# Patient Record
Sex: Female | Born: 1985 | Race: White | Hispanic: No | Marital: Married | Smoking: Never smoker
Health system: Southern US, Community
[De-identification: ages and names within clinical notes are randomized; demographics above are authoritative.]

## PROBLEM LIST (undated history)

## (undated) ENCOUNTER — Inpatient Hospital Stay (HOSPITAL_COMMUNITY): Payer: Self-pay

## (undated) DIAGNOSIS — N390 Urinary tract infection, site not specified: Secondary | ICD-10-CM

## (undated) DIAGNOSIS — R87629 Unspecified abnormal cytological findings in specimens from vagina: Secondary | ICD-10-CM

## (undated) DIAGNOSIS — O139 Gestational [pregnancy-induced] hypertension without significant proteinuria, unspecified trimester: Secondary | ICD-10-CM

## (undated) DIAGNOSIS — D649 Anemia, unspecified: Secondary | ICD-10-CM

## (undated) DIAGNOSIS — L309 Dermatitis, unspecified: Secondary | ICD-10-CM

## (undated) DIAGNOSIS — R0600 Dyspnea, unspecified: Secondary | ICD-10-CM

## (undated) DIAGNOSIS — R0602 Shortness of breath: Secondary | ICD-10-CM

## (undated) HISTORY — DX: Shortness of breath: R06.02

## (undated) HISTORY — PX: LIP REPAIR: SHX440

---

## 2008-11-24 ENCOUNTER — Emergency Department (HOSPITAL_COMMUNITY): Admission: EM | Admit: 2008-11-24 | Discharge: 2008-11-24 | Payer: Self-pay | Admitting: Emergency Medicine

## 2012-03-16 ENCOUNTER — Ambulatory Visit (INDEPENDENT_AMBULATORY_CARE_PROVIDER_SITE_OTHER): Payer: Self-pay | Admitting: Obstetrics and Gynecology

## 2012-03-16 ENCOUNTER — Encounter: Payer: Self-pay | Admitting: Obstetrics and Gynecology

## 2012-03-16 VITALS — BP 110/62 | Resp 16 | Ht 65.0 in | Wt 153.0 lb

## 2012-03-16 DIAGNOSIS — Z202 Contact with and (suspected) exposure to infections with a predominantly sexual mode of transmission: Secondary | ICD-10-CM

## 2012-03-16 DIAGNOSIS — Z139 Encounter for screening, unspecified: Secondary | ICD-10-CM

## 2012-03-16 DIAGNOSIS — N643 Galactorrhea not associated with childbirth: Secondary | ICD-10-CM

## 2012-03-16 DIAGNOSIS — N920 Excessive and frequent menstruation with regular cycle: Secondary | ICD-10-CM

## 2012-03-16 DIAGNOSIS — Z2089 Contact with and (suspected) exposure to other communicable diseases: Secondary | ICD-10-CM

## 2012-03-16 DIAGNOSIS — Z0000 Encounter for general adult medical examination without abnormal findings: Secondary | ICD-10-CM

## 2012-03-16 DIAGNOSIS — Z124 Encounter for screening for malignant neoplasm of cervix: Secondary | ICD-10-CM

## 2012-03-16 NOTE — Progress Notes (Signed)
Contraception per pt none Last pap per pt Never Last Mammo per pt never Last Colonoscopy per pt never Last Dexa Scan Per pt Never Primary MD per pt no PCP. Abuse at Home No.  Pt recently poss exposed to STD and is self pay and doesn't want entire screen but the "most" important.  She also c/o leaking from breast when she pressed it and feels are breasts are heavier, thinks she's pregnant.  Filed Vitals:   03/16/12 1126  BP: 110/62  Resp: 16   ROS: noncontributory  Physical Examination: General appearance - alert, well appearing, and in no distress Neck - supple, no significant adenopathy Chest - clear to auscultation, no wheezes, rales or rhonchi, symmetric air entry Heart - normal rate and regular rhythm Abdomen - soft, nontender, nondistended, no masses or organomegaly Breasts - breasts appear normal, no suspicious masses, no skin or nipple changes or axillary nodes, no nipple discharge Pelvic - normal external genitalia, vulva, vagina, cervix, uterus and adnexa Back exam - no CVAT Extremities - no edema, redness or tenderness in the calves or thighs  A/P TSH, PRL, CBC, serum preg (pt ok to get) STD testing HIV,RPR,GC/CT If galactorrhea persists, RTO sooner and consider mammo/ultrasound

## 2012-03-17 LAB — CBC
HCT: 39.8 % (ref 36.0–46.0)
MCHC: 32.4 g/dL (ref 30.0–36.0)
MCV: 91.5 fL (ref 78.0–100.0)
Platelets: 252 10*3/uL (ref 150–400)
RDW: 14.4 % (ref 11.5–15.5)
WBC: 7.2 10*3/uL (ref 4.0–10.5)

## 2012-03-20 LAB — PAP IG, CT-NG, RFX HPV ASCU: GC Probe Amp: NEGATIVE

## 2012-04-06 ENCOUNTER — Telehealth: Payer: Self-pay | Admitting: Obstetrics and Gynecology

## 2012-04-06 NOTE — Telephone Encounter (Signed)
Ar pt 

## 2012-04-07 ENCOUNTER — Telehealth: Payer: Self-pay

## 2012-04-07 NOTE — Telephone Encounter (Signed)
Jackie/lab res/elect

## 2012-04-07 NOTE — Telephone Encounter (Signed)
LM that all labs look normal, but still need final review by Dr. Su Hilt. If AR has any instructions or recommendations for pt after final review, I will call her back. Melody Comas A

## 2012-10-08 ENCOUNTER — Inpatient Hospital Stay (HOSPITAL_COMMUNITY): Payer: Medicaid Other

## 2012-10-08 ENCOUNTER — Encounter (HOSPITAL_COMMUNITY): Payer: Self-pay | Admitting: *Deleted

## 2012-10-08 ENCOUNTER — Inpatient Hospital Stay (HOSPITAL_COMMUNITY)
Admission: AD | Admit: 2012-10-08 | Discharge: 2012-10-08 | Disposition: A | Discharge status: Home / Self Care | Payer: Medicaid Other | Source: Ambulatory Visit | Attending: Family Medicine | Admitting: Family Medicine

## 2012-10-08 DIAGNOSIS — S39012A Strain of muscle, fascia and tendon of lower back, initial encounter: Secondary | ICD-10-CM

## 2012-10-08 DIAGNOSIS — R1031 Right lower quadrant pain: Secondary | ICD-10-CM | POA: Insufficient documentation

## 2012-10-08 DIAGNOSIS — O99891 Other specified diseases and conditions complicating pregnancy: Secondary | ICD-10-CM | POA: Insufficient documentation

## 2012-10-08 DIAGNOSIS — M549 Dorsalgia, unspecified: Secondary | ICD-10-CM | POA: Insufficient documentation

## 2012-10-08 DIAGNOSIS — O0930 Supervision of pregnancy with insufficient antenatal care, unspecified trimester: Secondary | ICD-10-CM

## 2012-10-08 LAB — URINALYSIS, ROUTINE W REFLEX MICROSCOPIC
Glucose, UA: NEGATIVE mg/dL
Hgb urine dipstick: NEGATIVE
Leukocytes, UA: NEGATIVE
Specific Gravity, Urine: >1.03 — ABNORMAL HIGH (ref 1.005–1.030)
pH: 5.5 (ref 5.0–8.0)

## 2012-10-08 LAB — WET PREP, GENITAL: Trich, Wet Prep: NONE SEEN

## 2012-10-08 NOTE — MAU Note (Signed)
PT SAYS PAIN STARTED Friday ON R FLANK SIDE  - NO MEDS TAKEN.    ON Saturday- AREA SORE.   NOW  PAIN  IS SLIGHT LESS THAN FRI/ SAT.

## 2012-10-08 NOTE — MAU Provider Note (Signed)
History     CSN: [id number]  Arrival date and time: 10/08/12 0035   None     Chief Complaint  Patient presents with  . Flank Pain  . Abdominal Pain   HPI 26 y.o. G2P0010 at [redacted]w[redacted]d by LMP.  pain in right back/side radiating to abdomen. Crampy - like "charlie horse".  Started yesterday, then stopped for a while. Started again this afternoon, worsening. Was lying in bed and turned over and hurt really bad. No medications. No bleeding. Does a lot of lifting at work, works as Adult nurse. No dysuria, no new vaginal discharge, no vaginal discomfort. Feeling baby move.   LMP 05/27/12, certain, regular. [facility]. [facility], waiting for Medicaid. Plans to go to [facility].   Second pregnancy, had miscarriage first trimester.   OB History    Grav Para Term Preterm Abortions TAB SAB Ect Mult Living   2    1  1          Past Medical History  Diagnosis Date  . Shortness of breath     when running    Past Surgical History  Procedure Date  . Lip repair     Family History  Problem Relation Age of Onset  . Diabetes Paternal Grandfather   . Stroke Paternal Grandmother   . Diabetes Paternal Grandmother   . Stroke Maternal Grandmother   . Stroke Maternal Grandfather     History  Substance Use Topics  . Smoking status: Never Smoker, second hand exposure at work  . Smokeless tobacco: Never Used  . Alcohol Use: No    Allergies:  Allergies  Allergen Reactions  . Horseradish (Cochlearia Armoracia) Swelling    Prescriptions prior to admission  Medication Sig Dispense Refill  . Multiple Vitamin (MULTIVITAMIN) capsule Take 1 capsule by mouth daily.        Review of Systems  Constitutional: Negative for fever and chills.  Eyes: Negative for blurred vision and double vision.  Respiratory: Negative for shortness of breath.   Cardiovascular: Negative for chest pain and palpitations.  Gastrointestinal: Positive for  constipation (last BM yesterday). Negative for nausea, vomiting and diarrhea.  Genitourinary: Negative for dysuria, urgency and frequency.  Musculoskeletal: Positive for back pain.  Neurological: Negative for headaches.   Physical Exam   Blood pressure 139/78, pulse 83, temperature 97.6 F (36.4 C), temperature source Oral, resp. rate 20, height 5\' 5"  (1.651 m), weight 79.289 kg (174 lb 12.8 oz), last menstrual period 05/27/2012.  Physical Exam  Constitutional: She is oriented to person, place, and time. She appears well-developed and well-nourished. No distress.  HENT:  Head: Normocephalic and atraumatic.  Eyes: Conjunctivae normal and EOM are normal.  Neck: Normal range of motion. Neck supple.  Cardiovascular: Normal rate, regular rhythm and normal heart sounds.   Respiratory: Effort normal and breath sounds normal. No respiratory distress.  GI: Soft. Bowel sounds are normal. There is tenderness (RLQ, mild pain with palpation of LLQ.). There is no rebound and no guarding.  Genitourinary:       Normal external genitalia, normal vagina and cervix. No CMT. Minimal discharge.  Musculoskeletal: Normal range of motion. She exhibits no edema and no tenderness.  Neurological: She is alert and oriented to person, place, and time.  Skin: Skin is warm and dry.  Psychiatric: She has a normal mood and affect.   FHR:  142 by doppler  Results for orders placed during the hospital encounter of 10/08/12 (from the  past 24 hour(s))  URINALYSIS, ROUTINE W REFLEX MICROSCOPIC     Status: Abnormal   Collection Time   10/08/12 12:47 AM      Component Value Range   Color, Urine YELLOW  YELLOW   APPearance CLEAR  CLEAR   Specific Gravity, Urine >1.030 (*) 1.005 - 1.030   pH 5.5  5.0 - 8.0   Glucose, UA NEGATIVE  NEGATIVE mg/dL   Hgb urine dipstick NEGATIVE  NEGATIVE   Bilirubin Urine NEGATIVE  NEGATIVE   Ketones, ur NEGATIVE  NEGATIVE mg/dL   Protein, ur NEGATIVE  NEGATIVE mg/dL   Urobilinogen, UA  0.2  0.0 - 1.0 mg/dL   Nitrite NEGATIVE  NEGATIVE   Leukocytes, UA NEGATIVE  NEGATIVE  WET PREP, GENITAL     Status: Abnormal   Collection Time   10/08/12  1:25 AM      Component Value Range   Yeast Wet Prep HPF POC NONE SEEN  NONE SEEN   Trich, Wet Prep NONE SEEN  NONE SEEN   Clue Cells Wet Prep HPF POC NONE SEEN  NONE SEEN   WBC, Wet Prep HPF POC FEW (*) NONE SEEN   ULTRASOUND *RADIOLOGY REPORT*  Clinical Data: Right lower quadrant abdominal and back pain.  LIMITED OBSTETRIC ULTRASOUND  Number of Fetuses: 1  Heart Rate: 140 bpm  Movement: Yes  Presentation: Transverse, head on maternal left  Placental Location: Anterior; no evidence of placental abruption  Previa: No  Amniotic Fluid (Subjective): Normal  Vertical pocket: 4.4cm  BPD: 5.19cm 21w 6d EDC: 02/12/2013  MATERNAL FINDINGS:  Cervix: Closed; measures 3.6 cm in length.  Uterus/Adnexae: The uterus is otherwise unremarkable.  The right ovary is grossly unremarkable in appearance, measuring  3.1 x 1.8 x 2.3 cm; the left ovary is not seen. No suspicious  adnexal masses are identified.  IMPRESSION:  1. Single live intrauterine pregnancy noted, with a biparietal  diameter of 5.2 cm, corresponding to a gestational age of [redacted] weeks  6 days. This does not match the gestational age by LMP, and  reflects an estimated date of delivery of Feb 12, 2013.  2. Placenta unremarkable in appearance; no evidence for placental  abruption.  3. Right ovary unremarkable in appearance; left ovary not seen.  Recommend followup with non-emergent complete OB 14+ wk US  examination for fetal biometric evaluation and anatomic survey if  not already performed.  MAU Course  Procedures   Assessment and Plan  26 y.o. G2P0010 at [redacted]w[redacted]d with RLQ and side pain.  - No evidence of kidney stone or pyelonephritis on UA - Likely muscle strain - tylenol, flexeril, heat, rest  Discharge home.  Napoleon Form 10/08/2012, 1:11 AM

## 2012-10-08 NOTE — MAU Note (Signed)
Pain started R flank and comes around to R side of abdomen. Started Friday night and then stopped on Sat. Now back tonight and sore

## 2012-10-08 NOTE — MAU Provider Note (Signed)
Chart reviewed and agree with management and plan.  

## 2012-10-09 LAB — GC/CHLAMYDIA PROBE AMP: GC Probe RNA: NEGATIVE

## 2012-10-11 NOTE — L&D Delivery Note (Signed)
Operative Delivery Note At 5:50 PM a viable female was delivered via Vaginal, Spontaneous Delivery with Vacuum Assitance.  Presentation: vertex; Position: Right,, Occiput,, Anterior; Station: +2 with significant caput and molding.  Verbal consent: obtained from patient.  Risks and benefits discussed in detail.  Risks include, but are not limited to the risks of anesthesia, bleeding, infection, damage to maternal tissues, fetal cephalohematoma.  There is also the risk of inability to effect vaginal delivery of the head, or shoulder dystocia that cannot be resolved by established maneuvers, leading to the need for emergency cesarean section.  Vacuum was in place for two contractions with one pop-off and this helped the presenting head from +2 to +4 station.  The patient was able to deliver the infant with subsequent contractions.  APGAR: 9, 10   Placenta status: Intact, Spontaneous delivery after 30 minutes  Cord: 3 vessels with the following complications: None.  Anesthesia: Epidural  Episiotomy: None Lacerations: 1st degree;Vaginal Suture Repair: 3.0 vicryl rapide Est. Blood Loss (mL): 600 ml  Patient noted to have a Stage I postpartum hemorrhage (PPH); small clot evacuated from lower uterine segment on exam.  She received pitocin IV bolus and 1000 mcg of misoprostol per rectum. Foley catheter placed in bladder.  Code Hemorrhage was activated; labs were drawn.  Bleeding was noted to subside, no further acute intervention needed; Code Hemorrhage called off.  Will follow up labs and continue to observe closely.  Mom to postpartum.  Baby to nursery-stable.  Jaynie Collins, MD, FACOG Attending Obstetrician & Gynecologist Faculty Practice, [facility]

## 2012-10-13 ENCOUNTER — Ambulatory Visit (HOSPITAL_COMMUNITY)
Admission: RE | Admit: 2012-10-13 | Discharge: 2012-10-13 | Disposition: A | Discharge status: Home / Self Care | Payer: Medicaid Other | Source: Ambulatory Visit | Attending: Family Medicine | Admitting: Family Medicine

## 2012-10-13 ENCOUNTER — Encounter: Payer: Self-pay | Admitting: Advanced Practice Midwife

## 2012-10-13 DIAGNOSIS — Z1389 Encounter for screening for other disorder: Secondary | ICD-10-CM | POA: Insufficient documentation

## 2012-10-13 DIAGNOSIS — Z363 Encounter for antenatal screening for malformations: Secondary | ICD-10-CM | POA: Insufficient documentation

## 2012-10-13 DIAGNOSIS — O358XX0 Maternal care for other (suspected) fetal abnormality and damage, not applicable or unspecified: Secondary | ICD-10-CM | POA: Insufficient documentation

## 2012-10-13 DIAGNOSIS — O0930 Supervision of pregnancy with insufficient antenatal care, unspecified trimester: Secondary | ICD-10-CM | POA: Insufficient documentation

## 2012-11-02 ENCOUNTER — Other Ambulatory Visit (HOSPITAL_COMMUNITY)
Admission: RE | Admit: 2012-11-02 | Discharge: 2012-11-02 | Disposition: A | Discharge status: Home / Self Care | Payer: Medicaid Other | Source: Ambulatory Visit | Attending: Advanced Practice Midwife | Admitting: Advanced Practice Midwife

## 2012-11-02 ENCOUNTER — Encounter: Payer: Self-pay | Admitting: Advanced Practice Midwife

## 2012-11-02 ENCOUNTER — Ambulatory Visit (INDEPENDENT_AMBULATORY_CARE_PROVIDER_SITE_OTHER): Payer: Medicaid Other | Admitting: Advanced Practice Midwife

## 2012-11-02 VITALS — BP 111/78 | Temp 97.4°F | Wt 178.2 lb

## 2012-11-02 DIAGNOSIS — Z113 Encounter for screening for infections with a predominantly sexual mode of transmission: Secondary | ICD-10-CM | POA: Insufficient documentation

## 2012-11-02 DIAGNOSIS — O26849 Uterine size-date discrepancy, unspecified trimester: Secondary | ICD-10-CM

## 2012-11-02 DIAGNOSIS — Z3490 Encounter for supervision of normal pregnancy, unspecified, unspecified trimester: Secondary | ICD-10-CM

## 2012-11-02 DIAGNOSIS — O0930 Supervision of pregnancy with insufficient antenatal care, unspecified trimester: Secondary | ICD-10-CM

## 2012-11-02 DIAGNOSIS — O26842 Uterine size-date discrepancy, second trimester: Secondary | ICD-10-CM | POA: Insufficient documentation

## 2012-11-02 DIAGNOSIS — Z01419 Encounter for gynecological examination (general) (routine) without abnormal findings: Secondary | ICD-10-CM | POA: Insufficient documentation

## 2012-11-02 LAB — POCT URINALYSIS DIP (DEVICE)
Glucose, UA: NEGATIVE mg/dL
Ketones, ur: NEGATIVE mg/dL
Protein, ur: NEGATIVE mg/dL
Specific Gravity, Urine: 1.02 (ref 1.005–1.030)

## 2012-11-02 LAB — HIV ANTIBODY (ROUTINE TESTING W REFLEX): HIV: NONREACTIVE

## 2012-11-02 NOTE — Progress Notes (Signed)
Pulse: 84 Pt wakes up with numbness in her arms.

## 2012-11-02 NOTE — Patient Instructions (Signed)
Breastfeeding Deciding to breastfeed is one of the best choices you can make for you and your baby. The information that follows gives a brief overview of the benefits of breastfeeding as well as common topics surrounding breastfeeding. BENEFITS OF BREASTFEEDING For the baby  The first milk (colostrum) helps the baby's digestive system function better.   There are antibodies in the mother's milk that help the baby fight off infections.   The baby has a lower incidence of asthma, allergies, and sudden infant death syndrome (SIDS).   The nutrients in breast milk are better for the baby than infant formulas, and breast milk helps the baby's brain grow better.   Babies who breastfeed have less gas, colic, and constipation.  For the mother  Breastfeeding helps develop a very special bond between the mother and her baby.   Breastfeeding is convenient, always available at the correct temperature, and costs nothing.   Breastfeeding burns calories in the mother and helps her lose weight that was gained during pregnancy.   Breastfeeding makes the uterus contract back down to normal size faster and slows bleeding following delivery.   Breastfeeding mothers have a lower risk of developing breast cancer.  BREASTFEEDING FREQUENCY  A healthy, full-term baby may breastfeed as often as every hour or space his or her feedings to every 3 hours.   Watch your baby for signs of hunger. Nurse your baby if he or she shows signs of hunger. How often you nurse will vary from baby to baby.   Nurse as often as the baby requests, or when you feel the need to reduce the fullness of your breasts.   Awaken the baby if it has been 3 4 hours since the last feeding.   Frequent feeding will help the mother make more milk and will help prevent problems, such as sore nipples and engorgement of the breasts.  BABY'S POSITION AT THE BREAST  Whether lying down or sitting, be sure that the baby's tummy is  facing your tummy.   Support the breast with 4 fingers underneath the breast and the thumb above. Make sure your fingers are well away from the nipple and baby's mouth.   Stroke the baby's lips gently with your finger or nipple.   When the baby's mouth is open wide enough, place all of your nipple and as much of the areola as possible into your baby's mouth.   Pull the baby in close so the tip of the nose and the baby's cheeks touch the breast during the feeding.  FEEDINGS AND SUCTION  The length of each feeding varies from baby to baby and from feeding to feeding.   The baby must suck about 2 3 minutes for your milk to get to him or her. This is called a "let down." For this reason, allow the baby to feed on each breast as long as he or she wants. Your baby will end the feeding when he or she has received the right balance of nutrients.   To break the suction, put your finger into the corner of the baby's mouth and slide it between his or her gums before removing your breast from his or her mouth. This will help prevent sore nipples.  HOW TO TELL WHETHER YOUR BABY IS GETTING ENOUGH BREAST MILK. Wondering whether or not your baby is getting enough milk is a common concern among mothers. You can be assured that your baby is getting enough milk if:   Your baby is actively  sucking and you hear swallowing.   Your baby seems relaxed and satisfied after a feeding.   Your baby nurses at least 8 12 times in a 24 hour time period. Nurse your baby until he or she unlatches or falls asleep at the first breast (at least 10 20 minutes), then offer the second side.   Your baby is wetting 5 6 disposable diapers (6 8 cloth diapers) in a 24 hour period by 81 62 days of age.   Your baby is having at least 3 4 stools every 24 hours for the first 6 weeks. The stool should be soft and yellow.   Your baby should gain 4 7 ounces per week after he or she is 87 days old.   Your breasts feel softer  after nursing.  REDUCING BREAST ENGORGEMENT  In the first week after your baby is born, you may experience signs of breast engorgement. When breasts are engorged, they feel heavy, warm, full, and may be tender to the touch. You can reduce engorgement if you:   Nurse frequently, every 2 3 hours. Mothers who breastfeed early and often have fewer problems with engorgement.   Place light ice packs on your breasts for 10 20 minutes between feedings. This reduces swelling. Wrap the ice packs in a lightweight towel to protect your skin. Bags of frozen vegetables work well for this purpose.   Take a warm shower or apply warm, moist heat to your breast for 5 10 minutes just before each feeding. This increases circulation and helps the milk flow.   Gently massage your breast before and during the feeding. Using your finger tips, massage from the chest wall towards your nipple in a circular motion.   Make sure that the baby empties at least one breast at every feeding before switching sides.   Use a breast pump to empty the breasts if your baby is sleepy or not nursing well. You may also want to pump if you are returning to work oryou feel you are getting engorged.   Avoid bottle feeds, pacifiers, or supplemental feedings of water or juice in place of breastfeeding. Breast milk is all the food your baby needs. It is not necessary for your baby to have water or formula. In fact, to help your breasts make more milk, it is best not to give your baby supplemental feedings during the early weeks.   Be sure the baby is latched on and positioned properly while breastfeeding.   Wear a supportive bra, avoiding underwire styles.   Eat a balanced diet with enough fluids.   Rest often, relax, and take your prenatal vitamins to prevent fatigue, stress, and anemia.  If you follow these suggestions, your engorgement should improve in 24 48 hours. If you are still experiencing difficulty, call your  lactation consultant or caregiver.  CARING FOR YOURSELF Take care of your breasts  Bathe or shower daily.   Avoid using soap on your nipples.   Start feedings on your left breast at one feeding and on your right breast at the next feeding.   You will notice an increase in your milk supply 2 5 days after delivery. You may feel some discomfort from engorgement, which makes your breasts very firm and often tender. Engorgement "peaks" out within 24 48 hours. In the meantime, apply warm moist towels to your breasts for 5 10 minutes before feeding. Gentle massage and expression of some milk before feeding will soften your breasts, making it easier for your  baby to latch on.   Wear a well-fitting nursing bra, and air dry your nipples for a 3 after each feeding.   Only use cotton bra pads.   Only use pure lanolin on your nipples after nursing. You do not need to wash it off before feeding the baby again. Another option is to express a few drops of breast milk and gently massage it into your nipples.  Take care of yourself  Eat well-balanced meals and nutritious snacks.   Drinking milk, fruit juice, and water to satisfy your thirst (about 8 glasses a day).   Get plenty of rest.  Avoid foods that you notice affect the baby in a bad way.  SEEK MEDICAL CARE IF:   You have difficulty with breastfeeding and need help.   You have a hard, red, sore area on your breast that is accompanied by a fever.   Your baby is too sleepy to eat well or is having trouble sleeping.   Your baby is wetting less than 6 diapers a day, by 84 days of age.   Your baby's skin or white part of his or her eyes is more yellow than it was in the hospital.   You feel depressed.  Document Released: 09/27/2005 Document Revised: 03/28/2012 Document Reviewed: 12/26/2011 [facility] Patient Information 2013 [place], [place].   Pregnancy - Second Trimester The second trimester of pregnancy (3 to 6  months) is a period of rapid growth for you and your baby. At the end of the sixth month, your baby is about 9 inches long and weighs 1 1/2 pounds. You will begin to feel the baby move between 18 and 20 weeks of the pregnancy. This is called quickening. Weight gain is faster. A clear fluid (colostrum) may leak out of your breasts. You may feel small contractions of the womb (uterus). This is known as false labor or Braxton-Hicks contractions. This is like a practice for labor when the baby is ready to be born. Usually, the problems with morning sickness have usually passed by the end of your first trimester. Some women develop small dark blotches (called cholasma, mask of pregnancy) on their face that usually goes away after the baby is born. Exposure to the sun makes the blotches worse. Acne may also develop in some pregnant women and pregnant women who have acne, may find that it goes away. PRENATAL EXAMS  Blood work may continue to be done during prenatal exams. These tests are done to check on your health and the probable health of your baby. Blood work is used to follow your blood levels (hemoglobin). Anemia (low hemoglobin) is common during pregnancy. Iron and vitamins are given to help prevent this. You will also be checked for diabetes between 24 and 28 weeks of the pregnancy. Some of the previous blood tests may be repeated.  The size of the uterus is measured during each visit. This is to make sure that the baby is continuing to grow properly according to the dates of the pregnancy.  Your blood pressure is checked every prenatal visit. This is to make sure you are not getting toxemia.  Your urine is checked to make sure you do not have an infection, diabetes or protein in the urine.  Your weight is checked often to make sure gains are happening at the suggested rate. This is to ensure that both you and your baby are growing normally.  Sometimes, an ultrasound is performed to confirm the proper  growth and development  of the baby. This is a test which bounces harmless sound waves off the baby so your caregiver can more accurately determine due dates. Sometimes, a specialized test is done on the amniotic fluid surrounding the baby. This test is called an amniocentesis. The amniotic fluid is obtained by sticking a needle into the belly (abdomen). This is done to check the chromosomes in instances where there is a concern about possible genetic problems with the baby. It is also sometimes done near the end of pregnancy if an early delivery is required. In this case, it is done to help make sure the baby's lungs are mature enough for the baby to live outside of the womb. CHANGES OCCURING IN THE SECOND TRIMESTER OF PREGNANCY Your body goes through many changes during pregnancy. They vary from person to person. Talk to your caregiver about changes you notice that you are concerned about.  During the second trimester, you will likely have an increase in your appetite. It is normal to have cravings for certain foods. This varies from person to person and pregnancy to pregnancy.  Your lower abdomen will begin to bulge.  You may have to urinate more often because the uterus and baby are pressing on your bladder. It is also common to get more bladder infections during pregnancy (pain with urination). You can help this by drinking lots of fluids and emptying your bladder before and after intercourse.  You may begin to get stretch marks on your hips, abdomen, and breasts. These are normal changes in the body during pregnancy. There are no exercises or medications to take that prevent this change.  You may begin to develop swollen and bulging veins (varicose veins) in your legs. Wearing support hose, elevating your feet for 15 minutes, 3 to 4 times a day and limiting salt in your diet helps lessen the problem.  Heartburn may develop as the uterus grows and pushes up against the stomach. Antacids  recommended by your caregiver helps with this problem. Also, eating smaller meals 4 to 5 times a day helps.  Constipation can be treated with a stool softener or adding bulk to your diet. Drinking lots of fluids, vegetables, fruits, and whole grains are helpful.  Exercising is also helpful. If you have been very active up until your pregnancy, most of these activities can be continued during your pregnancy. If you have been less active, it is helpful to start an exercise program such as walking.  Hemorrhoids (varicose veins in the rectum) may develop at the end of the second trimester. Warm sitz baths and hemorrhoid cream recommended by your caregiver helps hemorrhoid problems.  Backaches may develop during this time of your pregnancy. Avoid heavy lifting, wear low heal shoes and practice good posture to help with backache problems.  Some pregnant women develop tingling and numbness of their hand and fingers because of swelling and tightening of ligaments in the wrist (carpel tunnel syndrome). This goes away after the baby is born.  As your breasts enlarge, you may have to get a bigger bra. Get a comfortable, cotton, support bra. Do not get a nursing bra until the last month of the pregnancy if you will be nursing the baby.  You may get a dark line from your belly button to the pubic area called the linea nigra.  You may develop rosy cheeks because of increase blood flow to the face.  You may develop spider looking lines of the face, neck, arms and chest. These go away after  the baby is born. HOME CARE INSTRUCTIONS   It is extremely important to avoid all smoking, herbs, alcohol, and unprescribed drugs during your pregnancy. These chemicals affect the formation and growth of the baby. Avoid these chemicals throughout the pregnancy to ensure the delivery of a healthy infant.  Most of your home care instructions are the same as suggested for the first trimester of your pregnancy. Keep your  caregiver's appointments. Follow your caregiver's instructions regarding medication use, exercise and diet.  During pregnancy, you are providing food for you and your baby. Continue to eat regular, well-balanced meals. Choose foods such as meat, fish, milk and other low fat dairy products, vegetables, fruits, and whole-grain breads and cereals. Your caregiver will tell you of the ideal weight gain.  A physical sexual relationship may be continued up until near the end of pregnancy if there are no other problems. Problems could include early (premature) leaking of amniotic fluid from the membranes, vaginal bleeding, abdominal pain, or other medical or pregnancy problems.  Exercise regularly if there are no restrictions. Check with your caregiver if you are unsure of the safety of some of your exercises. The greatest weight gain will occur in the last 2 trimesters of pregnancy. Exercise will help you:  Control your weight.  Get you in shape for labor and delivery.  Lose weight after you have the baby.  Wear a good support or jogging bra for breast tenderness during pregnancy. This may help if worn during sleep. Pads or tissues may be used in the bra if you are leaking colostrum.  Do not use hot tubs, steam rooms or saunas throughout the pregnancy.  Wear your seat belt at all times when driving. This protects you and your baby if you are in an accident.  Avoid raw meat, uncooked cheese, cat litter boxes and soil used by cats. These carry germs that can cause birth defects in the baby.  The second trimester is also a good time to visit your dentist for your dental health if this has not been done yet. Getting your teeth cleaned is OK. Use a soft toothbrush. Brush gently during pregnancy.  It is easier to loose urine during pregnancy. Tightening up and strengthening the pelvic muscles will help with this problem. Practice stopping your urination while you are going to the bathroom. These are the  same muscles you need to strengthen. It is also the muscles you would use as if you were trying to stop from passing gas. You can practice tightening these muscles up 10 times a set and repeating this about 3 times per day. Once you know what muscles to tighten up, do not perform these exercises during urination. It is more likely to contribute to an infection by backing up the urine.  Ask for help if you have financial, counseling or nutritional needs during pregnancy. Your caregiver will be able to offer counseling for these needs as well as refer you for other special needs.  Your skin may become oily. If so, wash your face with mild soap, use non-greasy moisturizer and oil or cream based makeup. MEDICATIONS AND DRUG USE IN PREGNANCY  Take prenatal vitamins as directed. The vitamin should contain 1 milligram of folic acid. Keep all vitamins out of reach of children. Only a couple vitamins or tablets containing iron may be fatal to a baby or young child when ingested.  Avoid use of all medications, including herbs, over-the-counter medications, not prescribed or suggested by your caregiver. Only take  over-the-counter or prescription medicines for pain, discomfort, or fever as directed by your caregiver. Do not use aspirin.  Let your caregiver also know about herbs you may be using.  Alcohol is related to a number of birth defects. This includes fetal alcohol syndrome. All alcohol, in any form, should be avoided completely. Smoking will cause low birth rate and premature babies.  Street or illegal drugs are very harmful to the baby. They are absolutely forbidden. A baby born to an addicted mother will be addicted at birth. The baby will go through the same withdrawal an adult does. SEEK MEDICAL CARE IF:  You have any concerns or worries during your pregnancy. It is better to call with your questions if you feel they cannot wait, rather than worry about them. SEEK IMMEDIATE MEDICAL CARE IF:   An  unexplained oral temperature above 102 F (38.9 C) develops, or as your caregiver suggests.  You have leaking of fluid from the vagina (birth canal). If leaking membranes are suspected, take your temperature and tell your caregiver of this when you call.  There is vaginal spotting, bleeding, or passing clots. Tell your caregiver of the amount and how many pads are used. Light spotting in pregnancy is common, especially following intercourse.  You develop a bad smelling vaginal discharge with a change in the color from clear to white.  You continue to feel sick to your stomach (nauseated) and have no relief from remedies suggested. You vomit blood or coffee ground-like materials.  You lose more than 2 pounds of weight or gain more than 2 pounds of weight over 1 week, or as suggested by your caregiver.  You notice swelling of your face, hands, feet, or legs.  You get exposed to [place] measles and have never had them.  You are exposed to fifth disease or chickenpox.  You develop belly (abdominal) pain. Round ligament discomfort is a common non-cancerous (benign) cause of abdominal pain in pregnancy. Your caregiver still must evaluate you.  You develop a bad headache that does not go away.  You develop fever, diarrhea, pain with urination, or shortness of breath.  You develop visual problems, blurry, or double vision.  You fall or are in a car accident or any kind of trauma.  There is mental or physical violence at home. Document Released: 09/21/2001 Document Revised: 12/20/2011 Document Reviewed: 03/26/2009 [facility] Patient Information 2013 [place], [place].

## 2012-11-02 NOTE — Progress Notes (Signed)
Nutrition note: 1st visit consult Pt has gained 26.2# @ [redacted]w[redacted]d, which is > expected. Pt reports eating 3 meals & 1 snack/d. Pt reports eating very healthy. Pt is taking PNV. Pt reports no N&V or heartburn.  Pt received verbal & written education on general nutrition during pregnancy. Disc portion sizes. Disc wt gain goals of 15-25# or 0.6#/wk. Pt agrees to continue taking PNV & watch her portion sizes. Pt does not receive WIC but plans to apply. Pt plans to BF. F/u if referred Blondell Reveal, MS, RD, LDN

## 2012-11-02 NOTE — Assessment & Plan Note (Signed)
EDD by LMP 05/27/12 (wrong on [place] report) 03/03/13. EDD by Limited US 10/08/12 and Detailed [place] 10/13/12 02/12/13. F/U US scheduled early Feb to verify EDD.

## 2012-11-02 NOTE — Progress Notes (Signed)
Subjective:    Jenny [name] is being seen today for her first obstetrical visit.  This is not a planned pregnancy. She is at [redacted]w[redacted]d gestation by [place] 10/08/12 and detailed [place] 10/13/12. S>D. Her obstetrical history is significant for excessive weight gain, large for gestational age and late to care. Patient does intend to breast feed. Pregnancy history fully reviewed.  Menstrual History: OB History    Grav Para Term Preterm Abortions TAB SAB Ect Mult Living   2    1  1           Patient's last menstrual period was 05/27/2012. Certain, regular.    The following portions of the patient's history were reviewed and updated as appropriate: allergies, current medications, past family history, past medical history, past social history, past surgical history and problem list.  Review of Systems A comprehensive review of systems was negative.    Objective:    BP 111/78  Temp 97.4 F (36.3 C)  Wt 80.831 kg (178 lb 3.2 oz)  LMP 05/27/2012 General appearance: alert, cooperative, appears stated age and no distress Neck: no adenopathy and thyroid not enlarged, symmetric, no tenderness/mass/nodules Lungs: clear to auscultation bilaterally Heart: regular rate and rhythm, S1, S2 normal, no murmur, click, rub or gallop Abdomen: soft, non-tender; bowel sounds normal; no masses,  no organomegaly. Gravid. Fundal ht 26 cm. Pos FHTs. Pelvic: cervix normal in appearance, external genitalia normal, no adnexal masses or tenderness, no cervical motion tenderness and vagina normal without discharge Extremities: Homans sign is negative, no sign of DVT and no edema, redness or tenderness in the calves or thighs Neurologic: Alert and oriented X 3, normal strength and tone. Normal symmetric reflexes. Normal coordination and gait  Skin: Pink, raised, non-vesicular, excoriated rash on abd and legs.   Assessment:    Pregnancy at 25 and 3/7 weeks  1. Late prenatal care  Prenatal (OB Panel), HIV Antibody ( Reflex),  Culture, OB Urine, Cytology - PAP  2. Uterine size date discrepancy  US OB Follow Up  3. Supervision of normal pregnancy      Plan:    Initial labs drawn. Prenatal vitamins. Problem list reviewed and updated. AFP3 discussed: too late. Role of ultrasound in pregnancy discussed; fetal survey: results reviewed. Amniocentesis discussed: not indicated. Follow up in 3 weeks. 50% of 40 min visit spent on counseling and coordination of care.   [place], [place] 11/02/2012 9:53 PM

## 2012-11-03 ENCOUNTER — Encounter: Payer: Self-pay | Admitting: Advanced Practice Midwife

## 2012-11-03 DIAGNOSIS — Z6791 Unspecified blood type, Rh negative: Secondary | ICD-10-CM | POA: Insufficient documentation

## 2012-11-03 DIAGNOSIS — O26899 Other specified pregnancy related conditions, unspecified trimester: Secondary | ICD-10-CM | POA: Insufficient documentation

## 2012-11-03 LAB — OBSTETRIC PANEL
Antibody Screen: NEGATIVE
Eosinophils Relative: 2 % (ref 0–5)
HCT: 35.2 % — ABNORMAL LOW (ref 36.0–46.0)
Lymphocytes Relative: 14 % (ref 12–46)
Lymphs Abs: 1.8 10*3/uL (ref 0.7–4.0)
MCV: 91 fL (ref 78.0–100.0)
Monocytes Absolute: 0.7 10*3/uL (ref 0.1–1.0)
Monocytes Relative: 6 % (ref 3–12)
RBC: 3.87 MIL/uL (ref 3.87–5.11)
Rubella: 2.07 Index — ABNORMAL HIGH (ref <0.90)
WBC: 12.4 10*3/uL — ABNORMAL HIGH (ref 4.0–10.5)

## 2012-11-05 ENCOUNTER — Other Ambulatory Visit: Payer: Self-pay | Admitting: Advanced Practice Midwife

## 2012-11-05 DIAGNOSIS — O2342 Unspecified infection of urinary tract in pregnancy, second trimester: Secondary | ICD-10-CM

## 2012-11-05 LAB — CULTURE, OB URINE

## 2012-11-05 MED ORDER — NITROFURANTOIN MONOHYD MACRO 100 MG PO CAPS: 100.0000 mg | ORAL_CAPSULE | Freq: Two times a day (BID) | ORAL | Status: DC

## 2012-11-05 NOTE — Progress Notes (Signed)
Urine culture pos UTI. Rx'd Macrobid. Please notify pt.

## 2012-11-06 ENCOUNTER — Encounter: Payer: Self-pay | Admitting: *Deleted

## 2012-11-06 NOTE — Progress Notes (Signed)
Called patient and heard message that her phone does not accept incoming phone calls.  Will mail her a certified letter to inform her of urine culture results.

## 2012-11-07 ENCOUNTER — Other Ambulatory Visit: Payer: Self-pay | Admitting: Advanced Practice Midwife

## 2012-11-07 DIAGNOSIS — B951 Streptococcus, group B, as the cause of diseases classified elsewhere: Secondary | ICD-10-CM

## 2012-11-07 MED ORDER — AMPICILLIN 500 MG PO CAPS: 500.0000 mg | ORAL_CAPSULE | Freq: Four times a day (QID) | ORAL | Status: DC

## 2012-11-07 NOTE — Progress Notes (Signed)
Dx UTI. Macrobid already ordered 11/05/12. Final results show Enterococcus AND GBS. Needs Amp 500 PO Q6 x 7 days. May D/C Macrobid since Amp covers both. Will E-Rx. Please try to notify pt.

## 2012-11-08 ENCOUNTER — Telehealth: Payer: Self-pay | Admitting: *Deleted

## 2012-11-08 NOTE — Telephone Encounter (Signed)
Message copied by Gerome Apley on Wed Nov 08, 2012 11:24 AM ------      Message from: [place], [place]      Created: Tue Nov 07, 2012  8:39 PM       Dx UTI. Macrobid already ordered 11/05/12. Final results show Enterococcus AND GBS. Needs Amp 500 PO Q6 x 7 days. May D/C Macrobid since Amp covers both. Will E-Rx. Please try to notify pt.

## 2012-11-08 NOTE — Telephone Encounter (Signed)
Called Jenny [name] and left a message on her home phone number that we are trying to reach her with some important information- please call clinic during office hours and leave message best time and number to reach you

## 2012-11-08 NOTE — Telephone Encounter (Signed)
Jenny [name] called and left a message she was returning our call. Called Jenny [name] and got her voice mail and left a message we are returning her call- please call clinic tomorrow 8am-4pm.

## 2012-11-09 NOTE — Telephone Encounter (Signed)
Called patient, no answer- left message to call us back at the clinics for some information

## 2012-11-10 ENCOUNTER — Other Ambulatory Visit: Payer: Self-pay | Admitting: Obstetrics and Gynecology

## 2012-11-10 DIAGNOSIS — B951 Streptococcus, group B, as the cause of diseases classified elsewhere: Secondary | ICD-10-CM

## 2012-11-10 MED ORDER — AMPICILLIN 500 MG PO CAPS: 500.0000 mg | ORAL_CAPSULE | Freq: Four times a day (QID) | ORAL | Status: DC

## 2012-11-10 NOTE — Telephone Encounter (Signed)
Called pt and left detailed message on her personal voice mail regarding test results and need to change medication. She should begin her new medication today as directed and stop taking the Macrobid. She may call back if she has questions.

## 2012-11-15 ENCOUNTER — Ambulatory Visit (INDEPENDENT_AMBULATORY_CARE_PROVIDER_SITE_OTHER): Payer: Medicaid Other | Admitting: Obstetrics and Gynecology

## 2012-11-15 VITALS — BP 124/79 | Temp 97.4°F | Wt 186.0 lb

## 2012-11-15 DIAGNOSIS — O0930 Supervision of pregnancy with insufficient antenatal care, unspecified trimester: Secondary | ICD-10-CM

## 2012-11-15 LAB — POCT URINALYSIS DIP (DEVICE)
Ketones, ur: NEGATIVE mg/dL
Protein, ur: NEGATIVE mg/dL
Specific Gravity, Urine: 1.02 (ref 1.005–1.030)
pH: 6 (ref 5.0–8.0)

## 2012-11-15 NOTE — Progress Notes (Signed)
Pulse 83 Edema trace in hands.

## 2012-11-15 NOTE — Patient Instructions (Signed)

## 2012-11-15 NOTE — Progress Notes (Signed)
Wants to find out blood type of FOB before getting Rhophylac. 1 hr glucola, RPR, CBC today.  Right CTS symptoms discussed> try splint.   On Amox for enterococcus/GBS ASB.

## 2012-11-16 ENCOUNTER — Ambulatory Visit (HOSPITAL_COMMUNITY)
Admission: RE | Admit: 2012-11-16 | Discharge: 2012-11-16 | Disposition: A | Discharge status: Home / Self Care | Payer: Medicaid Other | Source: Ambulatory Visit | Attending: Advanced Practice Midwife | Admitting: Advanced Practice Midwife

## 2012-11-16 ENCOUNTER — Ambulatory Visit: Payer: Medicaid Other

## 2012-11-16 DIAGNOSIS — O26842 Uterine size-date discrepancy, second trimester: Secondary | ICD-10-CM

## 2012-11-16 DIAGNOSIS — B951 Streptococcus, group B, as the cause of diseases classified elsewhere: Secondary | ICD-10-CM

## 2012-11-16 DIAGNOSIS — O26899 Other specified pregnancy related conditions, unspecified trimester: Secondary | ICD-10-CM

## 2012-11-16 DIAGNOSIS — O26849 Uterine size-date discrepancy, unspecified trimester: Secondary | ICD-10-CM

## 2012-11-16 DIAGNOSIS — Z3689 Encounter for other specified antenatal screening: Secondary | ICD-10-CM | POA: Insufficient documentation

## 2012-11-16 DIAGNOSIS — Z6791 Unspecified blood type, Rh negative: Secondary | ICD-10-CM

## 2012-11-16 DIAGNOSIS — Z3490 Encounter for supervision of normal pregnancy, unspecified, unspecified trimester: Secondary | ICD-10-CM

## 2012-11-16 LAB — CBC
HCT: 32.8 % — ABNORMAL LOW (ref 36.0–46.0)
Hemoglobin: 11 g/dL — ABNORMAL LOW (ref 12.0–15.0)
MCV: 91.4 fL (ref 78.0–100.0)
RDW: 14.2 % (ref 11.5–15.5)
WBC: 11.3 10*3/uL — ABNORMAL HIGH (ref 4.0–10.5)

## 2012-11-16 MED ORDER — RHO D IMMUNE GLOBULIN 1500 UNIT/2ML IJ SOLN
300.0000 ug | Freq: Once | INTRAMUSCULAR | Status: AC
  Administered 2012-11-16: 300 ug via INTRAMUSCULAR

## 2012-11-17 LAB — ANTIBODY SCREEN: Antibody Screen: NEGATIVE

## 2012-11-20 ENCOUNTER — Encounter: Payer: Self-pay | Admitting: Advanced Practice Midwife

## 2012-11-29 ENCOUNTER — Other Ambulatory Visit: Payer: Self-pay | Admitting: Advanced Practice Midwife

## 2012-11-29 ENCOUNTER — Ambulatory Visit (INDEPENDENT_AMBULATORY_CARE_PROVIDER_SITE_OTHER): Payer: Medicaid Other | Admitting: Obstetrics and Gynecology

## 2012-11-29 VITALS — BP 120/73 | Temp 97.6°F | Wt 187.8 lb

## 2012-11-29 DIAGNOSIS — Z6791 Unspecified blood type, Rh negative: Secondary | ICD-10-CM

## 2012-11-29 DIAGNOSIS — O360990 Maternal care for other rhesus isoimmunization, unspecified trimester, not applicable or unspecified: Secondary | ICD-10-CM

## 2012-11-29 LAB — POCT URINALYSIS DIP (DEVICE)
Bilirubin Urine: NEGATIVE
Ketones, ur: NEGATIVE mg/dL
Protein, ur: NEGATIVE mg/dL
Specific Gravity, Urine: 1.015 (ref 1.005–1.030)

## 2012-11-29 NOTE — Progress Notes (Signed)
Doing well. Labs nl. Tx GBS in labor.

## 2012-11-29 NOTE — Patient Instructions (Signed)
Pregnancy - Third Trimester  The third trimester of pregnancy (the last 3 months) is a period of the most rapid growth for you and your baby. The baby approaches a length of 20 inches and a weight of 6 to 10 pounds. The baby is adding on fat and getting ready for life outside your body. While inside, babies have periods of sleeping and waking, suck their thumbs, and hiccups. You can often feel small contractions of the uterus. This is false labor. It is also called Braxton-Hicks contractions. This is like a practice for labor. The usual problems in this stage of pregnancy include more difficulty breathing, swelling of the hands and feet from water retention, and having to urinate more often because of the uterus and baby pressing on your bladder.   PRENATAL EXAMS  · Blood work may continue to be done during prenatal exams. These tests are done to check on your health and the probable health of your baby. Blood work is used to follow your blood levels (hemoglobin). Anemia (low hemoglobin) is common during pregnancy. Iron and vitamins are given to help prevent this. You may also continue to be checked for diabetes. Some of the past blood tests may be done again.  · The size of the uterus is measured during each visit. This makes sure your baby is growing properly according to your pregnancy dates.  · Your blood pressure is checked every prenatal visit. This is to make sure you are not getting toxemia.  · Your urine is checked every prenatal visit for infection, diabetes and protein.  · Your weight is checked at each visit. This is done to make sure gains are happening at the suggested rate and that you and your baby are growing normally.  · Sometimes, an ultrasound is performed to confirm the position and the proper growth and development of the baby. This is a test done that bounces harmless sound waves off the baby so your caregiver can more accurately determine due dates.  · Discuss the type of pain medication and  anesthesia you will have during your labor and delivery.  · Discuss the possibility and anesthesia if a Cesarean Section might be necessary.  · Inform your caregiver if there is any mental or physical violence at home.  Sometimes, a specialized non-stress test, contraction stress test and biophysical profile are done to make sure the baby is not having a problem. Checking the amniotic fluid surrounding the baby is called an amniocentesis. The amniotic fluid is removed by sticking a needle into the belly (abdomen). This is sometimes done near the end of pregnancy if an early delivery is required. In this case, it is done to help make sure the baby's lungs are mature enough for the baby to live outside of the womb. If the lungs are not mature and it is unsafe to deliver the baby, an injection of cortisone medication is given to the mother 1 to 2 days before the delivery. This helps the baby's lungs mature and makes it safer to deliver the baby.  CHANGES OCCURING IN THE THIRD TRIMESTER OF PREGNANCY  Your body goes through many changes during pregnancy. They vary from person to person. Talk to your caregiver about changes you notice and are concerned about.  · During the last trimester, you have probably had an increase in your appetite. It is normal to have cravings for certain foods. This varies from person to person and pregnancy to pregnancy.  · You may begin to   get stretch marks on your hips, abdomen, and breasts. These are normal changes in the body during pregnancy. There are no exercises or medications to take which prevent this change.  · Constipation may be treated with a stool softener or adding bulk to your diet. Drinking lots of fluids, fiber in vegetables, fruits, and whole grains are helpful.  · Exercising is also helpful. If you have been very active up until your pregnancy, most of these activities can be continued during your pregnancy. If you have been less active, it is helpful to start an exercise  program such as walking. Consult your caregiver before starting exercise programs.  · Avoid all smoking, alcohol, un-prescribed drugs, herbs and "street drugs" during your pregnancy. These chemicals affect the formation and growth of the baby. Avoid chemicals throughout the pregnancy to ensure the delivery of a healthy infant.  · Backache, varicose veins and hemorrhoids may develop or get worse.  · You will tire more easily in the third trimester, which is normal.  · The baby's movements may be stronger and more often.  · You may become short of breath easily.  · Your belly button may stick out.  · A yellow discharge may leak from your breasts called colostrum.  · You may have a bloody mucus discharge. This usually occurs a few days to a week before labor begins.  HOME CARE INSTRUCTIONS   · Keep your caregiver's appointments. Follow your caregiver's instructions regarding medication use, exercise, and diet.  · During pregnancy, you are providing food for you and your baby. Continue to eat regular, well-balanced meals. Choose foods such as meat, fish, milk and other low fat dairy products, vegetables, fruits, and whole-grain breads and cereals. Your caregiver will tell you of the ideal weight gain.  · A physical sexual relationship may be continued throughout pregnancy if there are no other problems such as early (premature) leaking of amniotic fluid from the membranes, vaginal bleeding, or belly (abdominal) pain.  · Exercise regularly if there are no restrictions. Check with your caregiver if you are unsure of the safety of your exercises. Greater weight gain will occur in the last 2 trimesters of pregnancy. Exercising helps:  · Control your weight.  · Get you in shape for labor and delivery.  · You lose weight after you deliver.  · Rest a lot with legs elevated, or as needed for leg cramps or low back pain.  · Wear a good support or jogging bra for breast tenderness during pregnancy. This may help if worn during  sleep. Pads or tissues may be used in the bra if you are leaking colostrum.  · Do not use hot tubs, steam rooms, or saunas.  · Wear your seat belt when driving. This protects you and your baby if you are in an accident.  · Avoid raw meat, cat litter boxes and soil used by cats. These carry germs that can cause birth defects in the baby.  · It is easier to loose urine during pregnancy. Tightening up and strengthening the pelvic muscles will help with this problem. You can practice stopping your urination while you are going to the bathroom. These are the same muscles you need to strengthen. It is also the muscles you would use if you were trying to stop from passing gas. You can practice tightening these muscles up 10 times a set and repeating this about 3 times per day. Once you know what muscles to tighten up, do not perform these   exercises during urination. It is more likely to cause an infection by backing up the urine.  · Ask for help if you have financial, counseling or nutritional needs during pregnancy. Your caregiver will be able to offer counseling for these needs as well as refer you for other special needs.  · Make a list of emergency phone numbers and have them available.  · Plan on getting help from family or friends when you go home from the hospital.  · Make a trial run to the hospital.  · Take prenatal classes with the father to understand, practice and ask questions about the labor and delivery.  · Prepare the baby's room/nursery.  · Do not travel out of the city unless it is absolutely necessary and with the advice of your caregiver.  · Wear only low or no heal shoes to have better balance and prevent falling.  MEDICATIONS AND DRUG USE IN PREGNANCY  · Take prenatal vitamins as directed. The vitamin should contain 1 milligram of folic acid. Keep all vitamins out of reach of children. Only a couple vitamins or tablets containing iron may be fatal to a baby or young child when ingested.  · Avoid use  of all medications, including herbs, over-the-counter medications, not prescribed or suggested by your caregiver. Only take over-the-counter or prescription medicines for pain, discomfort, or fever as directed by your caregiver. Do not use aspirin, ibuprofen (Motrin®, Advil®, Nuprin®) or naproxen (Aleve®) unless OK'd by your caregiver.  · Let your caregiver also know about herbs you may be using.  · Alcohol is related to a number of birth defects. This includes fetal alcohol syndrome. All alcohol, in any form, should be avoided completely. Smoking will cause low birth rate and premature babies.  · Street/illegal drugs are very harmful to the baby. They are absolutely forbidden. A baby born to an addicted mother will be addicted at birth. The baby will go through the same withdrawal an adult does.  SEEK MEDICAL CARE IF:  You have any concerns or worries during your pregnancy. It is better to call with your questions if you feel they cannot wait, rather than worry about them.  DECISIONS ABOUT CIRCUMCISION  You may or may not know the sex of your baby. If you know your baby is a boy, it may be time to think about circumcision. Circumcision is the removal of the foreskin of the penis. This is the skin that covers the sensitive end of the penis. There is no proven medical need for this. Often this decision is made on what is popular at the time or based upon religious beliefs and social issues. You can discuss these issues with your caregiver or pediatrician.  SEEK IMMEDIATE MEDICAL CARE IF:   · An unexplained oral temperature above 102° F (38.9° C) develops, or as your caregiver suggests.  · You have leaking of fluid from the vagina (birth canal). If leaking membranes are suspected, take your temperature and tell your caregiver of this when you call.  · There is vaginal spotting, bleeding or passing clots. Tell your caregiver of the amount and how many pads are used.  · You develop a bad smelling vaginal discharge with  a change in the color from clear to white.  · You develop vomiting that lasts more than 24 hours.  · You develop chills or fever.  · You develop shortness of breath.  · You develop burning on urination.  · You loose more than 2 pounds of weight   or gain more than 2 pounds of weight or as suggested by your caregiver.  · You notice sudden swelling of your face, hands, and feet or legs.  · You develop belly (abdominal) pain. Round ligament discomfort is a common non-cancerous (benign) cause of abdominal pain in pregnancy. Your caregiver still must evaluate you.  · You develop a severe headache that does not go away.  · You develop visual problems, blurred or double vision.  · If you have not felt your baby move for more than 1 hour. If you think the baby is not moving as much as usual, eat something with sugar in it and lie down on your left side for an hour. The baby should move at least 4 to 5 times per hour. Call right away if your baby moves less than that.  · You fall, are in a car accident or any kind of trauma.  · There is mental or physical violence at home.  Document Released: 09/21/2001 Document Revised: 12/20/2011 Document Reviewed: 03/26/2009  [employer]® Patient Information ©2013 [employer].

## 2012-12-13 ENCOUNTER — Ambulatory Visit (INDEPENDENT_AMBULATORY_CARE_PROVIDER_SITE_OTHER): Payer: Medicaid Other | Admitting: Advanced Practice Midwife

## 2012-12-13 ENCOUNTER — Other Ambulatory Visit: Payer: Self-pay | Admitting: Advanced Practice Midwife

## 2012-12-13 VITALS — BP 121/79 | Temp 97.7°F | Wt 191.9 lb

## 2012-12-13 DIAGNOSIS — O2390 Unspecified genitourinary tract infection in pregnancy, unspecified trimester: Secondary | ICD-10-CM

## 2012-12-13 DIAGNOSIS — O2342 Unspecified infection of urinary tract in pregnancy, second trimester: Secondary | ICD-10-CM

## 2012-12-13 LAB — POCT URINALYSIS DIP (DEVICE)
Bilirubin Urine: NEGATIVE
Glucose, UA: NEGATIVE mg/dL
Hgb urine dipstick: NEGATIVE
Ketones, ur: NEGATIVE mg/dL
Specific Gravity, Urine: 1.015 (ref 1.005–1.030)
Urobilinogen, UA: 0.2 mg/dL (ref 0.0–1.0)

## 2012-12-13 NOTE — Progress Notes (Signed)
Pt denies contractions, vaginal bleeding, or loss of fluid. Reports normal fetal movement. Complains of right hand swelling, numbness, and tingling. Recommended cock-up splint. Rh neg - got Rhophylac @28  wks, will get again post-partum.  GBS UTI treated with amoxicillin - test of cure today and will need PCN in labor. Does not want flu shot.

## 2012-12-13 NOTE — Progress Notes (Signed)
Pulse- 91 Patient reports some pelvic pain; reports a lot of numbness in right hand does seem slightly worse than usual, can hardly hold on to things at time.

## 2012-12-13 NOTE — Progress Notes (Signed)
Seen also by me Agree with resident's note  

## 2012-12-15 LAB — CULTURE, OB URINE: Colony Count: 70000

## 2012-12-27 ENCOUNTER — Ambulatory Visit (INDEPENDENT_AMBULATORY_CARE_PROVIDER_SITE_OTHER): Payer: Medicaid Other | Admitting: Advanced Practice Midwife

## 2012-12-27 VITALS — BP 122/82 | Temp 96.3°F | Wt 194.5 lb

## 2012-12-27 DIAGNOSIS — O26849 Uterine size-date discrepancy, unspecified trimester: Secondary | ICD-10-CM

## 2012-12-27 DIAGNOSIS — Z3493 Encounter for supervision of normal pregnancy, unspecified, third trimester: Secondary | ICD-10-CM

## 2012-12-27 DIAGNOSIS — R300 Dysuria: Secondary | ICD-10-CM

## 2012-12-27 LAB — POCT URINALYSIS DIP (DEVICE)
Bilirubin Urine: NEGATIVE
Glucose, UA: NEGATIVE mg/dL
Nitrite: NEGATIVE
Urobilinogen, UA: 0.2 mg/dL (ref 0.0–1.0)

## 2012-12-27 NOTE — Progress Notes (Signed)
Pt concerned that she may have a bladder infection because of her lower abdominal pain.

## 2012-12-27 NOTE — Progress Notes (Signed)
Pedal edema. No PIH Sx. Reports dysuria. UA neg. culture sent.

## 2012-12-27 NOTE — Patient Instructions (Addendum)
Pregnancy - Third Trimester  The third trimester of pregnancy (the last 3 months) is a period of the most rapid growth for you and your baby. The baby approaches a length of 20 inches and a weight of 6 to 10 pounds. The baby is adding on fat and getting ready for life outside your body. While inside, babies have periods of sleeping and waking, suck their thumbs, and hiccups. You can often feel small contractions of the uterus. This is false labor. It is also called Braxton-Hicks contractions. This is like a practice for labor. The usual problems in this stage of pregnancy include more difficulty breathing, swelling of the hands and feet from water retention, and having to urinate more often because of the uterus and baby pressing on your bladder.   PRENATAL EXAMS  · Blood work may continue to be done during prenatal exams. These tests are done to check on your health and the probable health of your baby. Blood work is used to follow your blood levels (hemoglobin). Anemia (low hemoglobin) is common during pregnancy. Iron and vitamins are given to help prevent this. You may also continue to be checked for diabetes. Some of the past blood tests may be done again.  · The size of the uterus is measured during each visit. This makes sure your baby is growing properly according to your pregnancy dates.  · Your blood pressure is checked every prenatal visit. This is to make sure you are not getting toxemia.  · Your urine is checked every prenatal visit for infection, diabetes and protein.  · Your weight is checked at each visit. This is done to make sure gains are happening at the suggested rate and that you and your baby are growing normally.  · Sometimes, an ultrasound is performed to confirm the position and the proper growth and development of the baby. This is a test done that bounces harmless sound waves off the baby so your caregiver can more accurately determine due dates.  · Discuss the type of pain medication and  anesthesia you will have during your labor and delivery.  · Discuss the possibility and anesthesia if a Cesarean Section might be necessary.  · Inform your caregiver if there is any mental or physical violence at home.  Sometimes, a specialized non-stress test, contraction stress test and biophysical profile are done to make sure the baby is not having a problem. Checking the amniotic fluid surrounding the baby is called an amniocentesis. The amniotic fluid is removed by sticking a needle into the belly (abdomen). This is sometimes done near the end of pregnancy if an early delivery is required. In this case, it is done to help make sure the baby's lungs are mature enough for the baby to live outside of the womb. If the lungs are not mature and it is unsafe to deliver the baby, an injection of cortisone medication is given to the mother 1 to 2 days before the delivery. This helps the baby's lungs mature and makes it safer to deliver the baby.  CHANGES OCCURING IN THE THIRD TRIMESTER OF PREGNANCY  Your body goes through many changes during pregnancy. They vary from person to person. Talk to your caregiver about changes you notice and are concerned about.  · During the last trimester, you have probably had an increase in your appetite. It is normal to have cravings for certain foods. This varies from person to person and pregnancy to pregnancy.  · You may begin to   get stretch marks on your hips, abdomen, and breasts. These are normal changes in the body during pregnancy. There are no exercises or medications to take which prevent this change.  · Constipation may be treated with a stool softener or adding bulk to your diet. Drinking lots of fluids, fiber in vegetables, fruits, and whole grains are helpful.  · Exercising is also helpful. If you have been very active up until your pregnancy, most of these activities can be continued during your pregnancy. If you have been less active, it is helpful to start an exercise  program such as walking. Consult your caregiver before starting exercise programs.  · Avoid all smoking, alcohol, un-prescribed drugs, herbs and "street drugs" during your pregnancy. These chemicals affect the formation and growth of the baby. Avoid chemicals throughout the pregnancy to ensure the delivery of a healthy infant.  · Backache, varicose veins and hemorrhoids may develop or get worse.  · You will tire more easily in the third trimester, which is normal.  · The baby's movements may be stronger and more often.  · You may become short of breath easily.  · Your belly button may stick out.  · A yellow discharge may leak from your breasts called colostrum.  · You may have a bloody mucus discharge. This usually occurs a few days to a week before labor begins.  HOME CARE INSTRUCTIONS   · Keep your caregiver's appointments. Follow your caregiver's instructions regarding medication use, exercise, and diet.  · During pregnancy, you are providing food for you and your baby. Continue to eat regular, well-balanced meals. Choose foods such as meat, fish, milk and other low fat dairy products, vegetables, fruits, and whole-grain breads and cereals. Your caregiver will tell you of the ideal weight gain.  · A physical sexual relationship may be continued throughout pregnancy if there are no other problems such as early (premature) leaking of amniotic fluid from the membranes, vaginal bleeding, or belly (abdominal) pain.  · Exercise regularly if there are no restrictions. Check with your caregiver if you are unsure of the safety of your exercises. Greater weight gain will occur in the last 2 trimesters of pregnancy. Exercising helps:  · Control your weight.  · Get you in shape for labor and delivery.  · You lose weight after you deliver.  · Rest a lot with legs elevated, or as needed for leg cramps or low back pain.  · Wear a good support or jogging bra for breast tenderness during pregnancy. This may help if worn during  sleep. Pads or tissues may be used in the bra if you are leaking colostrum.  · Do not use hot tubs, steam rooms, or saunas.  · Wear your seat belt when driving. This protects you and your baby if you are in an accident.  · Avoid raw meat, cat litter boxes and soil used by cats. These carry germs that can cause birth defects in the baby.  · It is easier to loose urine during pregnancy. Tightening up and strengthening the pelvic muscles will help with this problem. You can practice stopping your urination while you are going to the bathroom. These are the same muscles you need to strengthen. It is also the muscles you would use if you were trying to stop from passing gas. You can practice tightening these muscles up 10 times a set and repeating this about 3 times per day. Once you know what muscles to tighten up, do not perform these   exercises during urination. It is more likely to cause an infection by backing up the urine.  · Ask for help if you have financial, counseling or nutritional needs during pregnancy. Your caregiver will be able to offer counseling for these needs as well as refer you for other special needs.  · Make a list of emergency phone numbers and have them available.  · Plan on getting help from family or friends when you go home from the hospital.  · Make a trial run to the hospital.  · Take prenatal classes with the father to understand, practice and ask questions about the labor and delivery.  · Prepare the baby's room/nursery.  · Do not travel out of the city unless it is absolutely necessary and with the advice of your caregiver.  · Wear only low or no heal shoes to have better balance and prevent falling.  MEDICATIONS AND DRUG USE IN PREGNANCY  · Take prenatal vitamins as directed. The vitamin should contain 1 milligram of folic acid. Keep all vitamins out of reach of children. Only a couple vitamins or tablets containing iron may be fatal to a baby or young child when ingested.  · Avoid use  of all medications, including herbs, over-the-counter medications, not prescribed or suggested by your caregiver. Only take over-the-counter or prescription medicines for pain, discomfort, or fever as directed by your caregiver. Do not use aspirin, ibuprofen (Motrin®, Advil®, Nuprin®) or naproxen (Aleve®) unless OK'd by your caregiver.  · Let your caregiver also know about herbs you may be using.  · Alcohol is related to a number of birth defects. This includes fetal alcohol syndrome. All alcohol, in any form, should be avoided completely. Smoking will cause low birth rate and premature babies.  · Street/illegal drugs are very harmful to the baby. They are absolutely forbidden. A baby born to an addicted mother will be addicted at birth. The baby will go through the same withdrawal an adult does.  SEEK MEDICAL CARE IF:  You have any concerns or worries during your pregnancy. It is better to call with your questions if you feel they cannot wait, rather than worry about them.  DECISIONS ABOUT CIRCUMCISION  You may or may not know the sex of your baby. If you know your baby is a boy, it may be time to think about circumcision. Circumcision is the removal of the foreskin of the penis. This is the skin that covers the sensitive end of the penis. There is no proven medical need for this. Often this decision is made on what is popular at the time or based upon religious beliefs and social issues. You can discuss these issues with your caregiver or pediatrician.  SEEK IMMEDIATE MEDICAL CARE IF:   · An unexplained oral temperature above 102° F (38.9° C) develops, or as your caregiver suggests.  · You have leaking of fluid from the vagina (birth canal). If leaking membranes are suspected, take your temperature and tell your caregiver of this when you call.  · There is vaginal spotting, bleeding or passing clots. Tell your caregiver of the amount and how many pads are used.  · You develop a bad smelling vaginal discharge with  a change in the color from clear to white.  · You develop vomiting that lasts more than 24 hours.  · You develop chills or fever.  · You develop shortness of breath.  · You develop burning on urination.  · You loose more than 2 pounds of weight   or gain more than 2 pounds of weight or as suggested by your caregiver.  · You notice sudden swelling of your face, hands, and feet or legs.  · You develop belly (abdominal) pain. Round ligament discomfort is a common non-cancerous (benign) cause of abdominal pain in pregnancy. Your caregiver still must evaluate you.  · You develop a severe headache that does not go away.  · You develop visual problems, blurred or double vision.  · If you have not felt your baby move for more than 1 hour. If you think the baby is not moving as much as usual, eat something with sugar in it and lie down on your left side for an hour. The baby should move at least 4 to 5 times per hour. Call right away if your baby moves less than that.  · You fall, are in a car accident or any kind of trauma.  · There is mental or physical violence at home.  Document Released: 09/21/2001 Document Revised: 12/20/2011 Document Reviewed: 03/26/2009  [employer]® Patient Information ©2013 [employer].

## 2012-12-29 LAB — CULTURE, OB URINE: Colony Count: >=100000

## 2013-01-10 ENCOUNTER — Ambulatory Visit (INDEPENDENT_AMBULATORY_CARE_PROVIDER_SITE_OTHER): Payer: Medicaid Other | Admitting: Obstetrics and Gynecology

## 2013-01-10 VITALS — BP 130/85 | Temp 97.6°F | Wt 204.0 lb

## 2013-01-10 DIAGNOSIS — Z3493 Encounter for supervision of normal pregnancy, unspecified, third trimester: Secondary | ICD-10-CM

## 2013-01-10 DIAGNOSIS — O0930 Supervision of pregnancy with insufficient antenatal care, unspecified trimester: Secondary | ICD-10-CM

## 2013-01-10 DIAGNOSIS — O26849 Uterine size-date discrepancy, unspecified trimester: Secondary | ICD-10-CM

## 2013-01-10 LAB — POCT URINALYSIS DIP (DEVICE)
Hgb urine dipstick: NEGATIVE
Nitrite: NEGATIVE
Protein, ur: NEGATIVE mg/dL
pH: 6.5 (ref 5.0–8.0)

## 2013-01-10 NOTE — Addendum Note (Signed)
Addended by: Caren Griffins C on: 01/10/2013 11:48 AM   Modules accepted: Level of Service

## 2013-01-10 NOTE — Patient Instructions (Signed)
Preeclampsia and Eclampsia Preeclampsia is a condition of high blood pressure during pregnancy. It can happen at 20 weeks or later in pregnancy. If high blood pressure occurs in the second half of pregnancy with no other symptoms, it is called gestational hypertension and goes away after the baby is born. If any of the symptoms listed below develop with gestational hypertension, it is then called preeclampsia. Eclampsia (convulsions) may follow preeclampsia. This is one of the reasons for regular prenatal checkups. Early diagnosis and treatment are very important to prevent eclampsia. CAUSES  There is no known cause of preeclampsia/eclampsia in pregnancy. There are several known conditions that may put the pregnant woman at risk, such as:  The first pregnancy.  Having preeclampsia in a past pregnancy.  Having lasting (chronic) high blood pressure.  Having multiples (twins, triplets).  Being age 35 or older.  African American ethnic background.  Having kidney disease or diabetes.  Medical conditions such as lupus or blood diseases.  Being overweight (obese). SYMPTOMS   High blood pressure.  Headaches.  Sudden weight gain.  Swelling of hands, face, legs, and feet.  Protein in the urine.  Feeling sick to your stomach (nauseous) and throwing up (vomiting).  Vision problems (blurred or double vision).  Numbness in the face, arms, legs, and feet.  Dizziness.  Slurred speech.  Preeclampsia can cause growth retardation in the fetus.  Separation (abruption) of the placenta.  Not enough fluid in the amniotic sac (oligohydramnios).  Sensitivity to bright lights.  Belly (abdominal) pain. DIAGNOSIS  If protein is found in the urine in the second half of pregnancy, this is considered preeclampsia. Other symptoms mentioned above may also be present. TREATMENT  It is necessary to treat this.  Your caregiver may prescribe bed rest early in this condition. Plenty of rest and  salt restriction may be all that is needed.  Medicines may be necessary to lower blood pressure if the condition does not respond to more conservative measures.  In more severe cases, hospitalization may be needed:  For treatment of blood pressure.  To control fluid retention.  To monitor the baby to see if the condition is causing harm to the baby.  Hospitalization is the best way to treat the first sign of preeclampsia. This is so the mother and baby can be watched closely and blood tests can be done effectively and correctly.  If the condition becomes severe, it may be necessary to induce labor or to remove the infant by surgical means (cesarean section). The best cure for preeclampsia/eclampsia is to deliver the baby. Preeclampsia and eclampsia involve risks to mother and infant. Your caregiver will discuss these risks with you. Together, you can work out the best possible approach to your problems. Make sure you keep your prenatal visits as scheduled. Not keeping appointments could result in a chronic or permanent injury, pain, disability to you, and death or injury to you or your unborn baby. If there is any problem keeping the appointment, you must call to reschedule. HOME CARE INSTRUCTIONS   Keep your prenatal appointments and tests as scheduled.  Tell your caregiver if you have any of the above risk factors.  Get plenty of rest and sleep.  Eat a balanced diet that is low in salt, and do not add salt to your food.  Avoid stressful situations.  Only take over-the-counter and prescriptions medicines for pain, discomfort, or fever as directed by your caregiver. SEEK IMMEDIATE MEDICAL CARE IF:   You develop severe swelling   anywhere in the body. This usually occurs in the legs.  You gain 5 lb/2.3 kg or more in a week.  You develop a severe headache, dizziness, problems with your vision, or confusion.  You have abdominal pain, nausea, or vomiting.  You have a seizure.  You  have trouble moving any part of your body, or you develop numbness or problems speaking.  You have bruising or abnormal bleeding from anywhere in the body.  You develop a stiff neck.  You pass out. MAKE SURE YOU:   Understand these instructions.  Will watch your condition.  Will get help right away if you are not doing well or get worse. Document Released: 09/24/2000 Document Revised: 12/20/2011 Document Reviewed: 05/10/2008 [facility] Patient Information 2013 [place], [place]. Edema Edema is an abnormal build-up of fluids in tissues. Because this is partly dependent on gravity (water flows to the lowest place), it is more common in the legs and thighs (lower extremities). It is also common in the looser tissues, like around the eyes. Painless swelling of the feet and ankles is common and increases as a person ages. It may affect both legs and may include the calves or even thighs. When squeezed, the fluid may move out of the affected area and may leave a dent for a few moments. CAUSES   Prolonged standing or sitting in one place for extended periods of time. Movement helps pump tissue fluid into the veins, and absence of movement prevents this, resulting in edema.  Varicose veins. The valves in the veins do not work as well as they should. This causes fluid to leak into the tissues.  Fluid and salt overload.  Injury, burn, or surgery to the leg, ankle, or foot, may damage veins and allow fluid to leak out.  Sunburn damages vessels. Leaky vessels allow fluid to go out into the sunburned tissues.  Allergies (from insect bites or stings, medications or chemicals) cause swelling by allowing vessels to become leaky.  Protein in the blood helps keep fluid in your vessels. Low protein, as in malnutrition, allows fluid to leak out.  Hormonal changes, including pregnancy and menstruation, cause fluid retention. This fluid may leak out of vessels and cause edema.  Medications that cause fluid  retention. Examples are sex hormones, blood pressure medications, steroid treatment, or anti-depressants.  Some illnesses cause edema, especially heart failure, kidney disease, or liver disease.  Surgery that cuts veins or lymph nodes, such as surgery done for the heart or for breast cancer, may result in edema. DIAGNOSIS  Your caregiver is usually easily able to determine what is causing your swelling (edema) by simply asking what is wrong (getting a history) and examining you (doing a physical). Sometimes x-rays, EKG (electrocardiogram or heart tracing), and blood work may be done to evaluate for underlying medical illness. TREATMENT  General treatment includes:  Leg elevation (or elevation of the affected body part).  Restriction of fluid intake.  Prevention of fluid overload.  Compression of the affected body part. Compression with elastic bandages or support stockings squeezes the tissues, preventing fluid from entering and forcing it back into the blood vessels.  Diuretics (also called water pills or fluid pills) pull fluid out of your body in the form of increased urination. These are effective in reducing the swelling, but can have side effects and must be used only under your caregiver's supervision. Diuretics are appropriate only for some types of edema. The specific treatment can be directed at any underlying causes discovered. Heart,  liver, or kidney disease should be treated appropriately. HOME CARE INSTRUCTIONS   Elevate the legs (or affected body part) above the level of the heart, while lying down.  Avoid sitting or standing still for prolonged periods of time.  Avoid putting anything directly under the knees when lying down, and do not wear constricting clothing or garters on the upper legs.  Exercising the legs causes the fluid to work back into the veins and lymphatic channels. This may help the swelling go down.  The pressure applied by elastic bandages or support  stockings can help reduce ankle swelling.  A low-salt diet may help reduce fluid retention and decrease the ankle swelling.  Take any medications exactly as prescribed. SEEK MEDICAL CARE IF:  Your edema is not responding to recommended treatments. SEEK IMMEDIATE MEDICAL CARE IF:   You develop shortness of breath or chest pain.  You cannot breathe when you lay down; or if, while lying down, you have to get up and go to the window to get your breath.  You are having increasing swelling without relief from treatment.  You develop a fever over 102 F (38.9 C).  You develop pain or redness in the areas that are swollen.  Tell your caregiver right away if you have gained 3 lb/1.4 kg in 1 day or 5 lb/2.3 kg in a week. MAKE SURE YOU:   Understand these instructions.  Will watch your condition.  Will get help right away if you are not doing well or get worse. Document Released: 09/27/2005 Document Revised: 03/28/2012 Document Reviewed: 05/15/2008 [facility] Patient Information 2013 [place], [place].

## 2013-01-10 NOTE — Progress Notes (Signed)
Pulse 88 Edema trace in feet. C/o pelvic pain.  Rapid weight gain. 2+ leg edema. No H/A. Watch BP. GBS next.

## 2013-01-17 ENCOUNTER — Ambulatory Visit (INDEPENDENT_AMBULATORY_CARE_PROVIDER_SITE_OTHER): Payer: Medicaid Other | Admitting: Advanced Practice Midwife

## 2013-01-17 ENCOUNTER — Encounter: Payer: Self-pay | Admitting: Advanced Practice Midwife

## 2013-01-17 ENCOUNTER — Other Ambulatory Visit: Payer: Self-pay | Admitting: Obstetrics & Gynecology

## 2013-01-17 VITALS — BP 134/83 | Temp 98.1°F | Wt 204.8 lb

## 2013-01-17 DIAGNOSIS — O0930 Supervision of pregnancy with insufficient antenatal care, unspecified trimester: Secondary | ICD-10-CM

## 2013-01-17 LAB — OB RESULTS CONSOLE GC/CHLAMYDIA
Chlamydia: NEGATIVE
Gonorrhea: NEGATIVE

## 2013-01-17 LAB — POCT URINALYSIS DIP (DEVICE)
Leukocytes, UA: NEGATIVE
Protein, ur: NEGATIVE mg/dL
Urobilinogen, UA: 0.2 mg/dL (ref 0.0–1.0)
pH: 7 (ref 5.0–8.0)

## 2013-01-17 NOTE — Progress Notes (Signed)
Pulse- 83   Edema-ankles

## 2013-01-17 NOTE — Progress Notes (Signed)
Well except bilateral pedal edema, no PIH symptoms. Rev'd precautions. GBS + on urine culture. GC/CT collected today. Rev'd kick counts and labor precautions. Declines cervical exam.

## 2013-01-18 LAB — GC/CHLAMYDIA PROBE AMP: CT Probe RNA: NEGATIVE

## 2013-01-24 ENCOUNTER — Ambulatory Visit (INDEPENDENT_AMBULATORY_CARE_PROVIDER_SITE_OTHER): Payer: Medicaid Other | Admitting: Family Medicine

## 2013-01-24 VITALS — BP 145/92 | Temp 97.0°F | Wt 212.7 lb

## 2013-01-24 DIAGNOSIS — O163 Unspecified maternal hypertension, third trimester: Secondary | ICD-10-CM

## 2013-01-24 DIAGNOSIS — Z3493 Encounter for supervision of normal pregnancy, unspecified, third trimester: Secondary | ICD-10-CM

## 2013-01-24 DIAGNOSIS — O139 Gestational [pregnancy-induced] hypertension without significant proteinuria, unspecified trimester: Secondary | ICD-10-CM

## 2013-01-24 LAB — CBC
HCT: 34.5 % — ABNORMAL LOW (ref 36.0–46.0)
Hemoglobin: 11.8 g/dL — ABNORMAL LOW (ref 12.0–15.0)
MCV: 90.1 fL (ref 78.0–100.0)
RBC: 3.83 MIL/uL — ABNORMAL LOW (ref 3.87–5.11)
WBC: 11.8 10*3/uL — ABNORMAL HIGH (ref 4.0–10.5)

## 2013-01-24 LAB — COMPREHENSIVE METABOLIC PANEL
ALT: 21 U/L (ref 0–35)
BUN: 13 mg/dL (ref 6–23)
CO2: 20 mEq/L (ref 19–32)
Calcium: 9.2 mg/dL (ref 8.4–10.5)
Chloride: 108 mEq/L (ref 96–112)
Creat: 0.6 mg/dL (ref 0.50–1.10)
Total Bilirubin: 0.3 mg/dL (ref 0.3–1.2)

## 2013-01-24 LAB — POCT URINALYSIS DIP (DEVICE)
Bilirubin Urine: NEGATIVE
Hgb urine dipstick: NEGATIVE
Ketones, ur: NEGATIVE mg/dL
Protein, ur: NEGATIVE mg/dL
pH: 6.5 (ref 5.0–8.0)

## 2013-01-24 NOTE — Progress Notes (Signed)
BP elevated at start of visit. No headache, vision changes, RUQ pain. Hands and feet swelling.  Recheck of BP 139/86.  CBC, CMP, Urine protein:creatinine ratio ordered. Discussed light activity level. Pt does not work. Discussed signs/symptoms of preeclampsia and reviewed labor precautions.

## 2013-01-24 NOTE — Patient Instructions (Addendum)
Normal Labor and Delivery Your caregiver must first be sure you are in labor. Signs of labor include:  You may pass what is called "the mucus plug" before labor begins. This is a small amount of blood stained mucus.  Regular uterine contractions.  The time between contractions get closer together.  The discomfort and pain gradually gets more intense.  Pains are mostly located in the back.  Pains get worse when walking.  The cervix (the opening of the uterus becomes thinner (begins to efface) and opens up (dilates). Once you are in labor and admitted into the hospital or care center, your caregiver will do the following:  A complete physical examination.  Check your vital signs (blood pressure, pulse, temperature and the fetal heart rate).  Do a vaginal examination (using a sterile glove and lubricant) to determine:  The position (presentation) of the baby (head [vertex] or buttock first).  The level (station) of the baby's head in the birth canal.  The effacement and dilatation of the cervix.  You may have your pubic hair shaved and be given an enema depending on your caregiver and the circumstance.  An electronic monitor is usually placed on your abdomen. The monitor follows the length and intensity of the contractions, as well as the baby's heart rate.  Usually, your caregiver will insert an IV in your arm with a bottle of sugar water. This is done as a precaution so that medications can be given to you quickly during labor or delivery. NORMAL LABOR AND DELIVERY IS DIVIDED UP INTO 3 STAGES: First Stage This is when regular contractions begin and the cervix begins to efface and dilate. This stage can last from 3 to 15 hours. The end of the first stage is when the cervix is 100% effaced and 10 centimeters dilated. Pain medications may be given by   Injection (morphine, demerol, etc.)  Regional anesthesia (spinal, caudal or epidural, anesthetics given in different locations of  the spine). Paracervical pain medication may be given, which is an injection of and anesthetic on each side of the cervix. A pregnant woman may request to have "Natural Childbirth" which is not to have any medications or anesthesia during her labor and delivery. Second Stage This is when the baby comes down through the birth canal (vagina) and is born. This can take 1 to 4 hours. As the baby's head comes down through the birth canal, you may feel like you are going to have a bowel movement. You will get the urge to bear down and push until the baby is delivered. As the baby's head is being delivered, the caregiver will decide if an episiotomy (a cut in the perineum and vagina area) is needed to prevent tearing of the tissue in this area. The episiotomy is sewn up after the delivery of the baby and placenta. Sometimes a mask with nitrous oxide is given for the mother to breath during the delivery of the baby to help if there is too much pain. The end of Stage 2 is when the baby is fully delivered. Then when the umbilical cord stops pulsating it is clamped and cut. Third Stage The third stage begins after the baby is completely delivered and ends after the placenta (afterbirth) is delivered. This usually takes 5 to 30 minutes. After the placenta is delivered, a medication is given either by intravenous or injection to help contract the uterus and prevent bleeding. The third stage is not painful and pain medication is usually not necessary.   If an episiotomy was done, it is repaired at this time. After the delivery, the mother is watched and monitored closely for 1 to 2 hours to make sure there is no postpartum bleeding (hemorrhage). If there is a lot of bleeding, medication is given to contract the uterus and stop the bleeding. Document Released: 07/06/2008 Document Revised: 12/20/2011 Document Reviewed: 07/06/2008 [employer] Patient Information 2013 [employer].   Hypertension During  Pregnancy Hypertension is also called high blood pressure. It can occur at any time in life and during pregnancy. When you have hypertension, there is extra pressure inside your blood vessels that carry blood from the heart to the rest of your body (arteries). Hypertension during pregnancy can cause problems for you and your baby. Your baby might not weigh as much as it should at birth or might be born early (premature). Very bad cases of hypertension during pregnancy can be life-threatening.  There are different types of hypertension during pregnancy.   Chronic hypertension. This happens when a woman has hypertension before pregnancy and it continues during pregnancy.  Gestational hypertension. This is when hypertension develops during pregnancy.  Preeclampsia or toxemia of pregnancy. This is a very serious type of hypertension that develops only during pregnancy. It is a disease that affects the whole body (systemic) and can be very dangerous for both mother and baby.  Gestational hypertension and preeclampsia usually go away after your baby is born. Blood pressure generally stabilizes within 6 weeks. Women who have hypertension during pregnancy have a greater chance of developing hypertension later in life or with future pregnancies. UNDERSTANDING BLOOD PRESSURE Blood pressure moves blood in your body. Sometimes, the force that moves the blood becomes too strong.  A blood pressure reading is given in 2 numbers and looks like a fraction.  The top number is called the systolic pressure. When your heart beats, it forces more blood to flow through the arteries. Pressure inside the arteries goes up.  The bottom number is the diastolic pressure. Pressure goes down between beats. That is when the heart is resting.  You may have hypertension if:  Your systolic blood pressure is above 140.  Your diastolic pressure is above 90. RISK FACTORS Some factors make you more likely to develop hypertension  during pregnancy. Risk factors include:  Having hypertension before pregnancy.  Having hypertension during a previous pregnancy.  Being overweight.  Being older than 40.  Being pregnant with more than 1 baby (multiples).  Having diabetes or kidney problems. SYMPTOMS Chronic and gestational hypertension may not cause symptoms. Preeclampsia has symptoms, which may include:  Increased protein in your urine. Your caregiver will check for this at every prenatal visit.  Swelling of your hands and face.  Rapid weight gain.  Headaches.  Visual changes.  Being bothered by light.  Abdominal pain, especially in the right upper area.  Chest pain.  Shortness of breath.  Increased reflexes.  Seizures. Seizures occur with a more severe form of preeclampsia, called eclampsia. DIAGNOSIS   You may be diagnosed with hypertension during pregnancy during a regular prenatal exam. At each visit, tests may include:  Blood pressure checks.  A urine test to check for protein in your urine.  The type of hypertension you are diagnosed with depends on when you developed it. It also depends on your specific blood pressure reading.  Developing hypertension before 20 weeks of pregnancy is consistent with chronic hypertension.  Developing hypertension after 20 weeks of pregnancy is consistent with gestational hypertension.    Hypertension with increased urinary protein is diagnosed as preeclampsia.  Blood pressure measurements that stay above 160 systolic or 110 diastolic are a sign of severe preeclampsia. TREATMENT Treatment for hypertension during pregnancy varies. Treatment depends on the type of hypertension and how serious it is.  If you take medicine for chronic hypertension, you may need to switch medicines.  Drugs called ACE inhibitors should not be taken during pregnancy.  Low-dose aspirin may be suggested for women who have risk factors for preeclampsia.  If you have gestational  hypertension, you may need to take a blood pressure medicine that is safe during pregnancy. Your caregiver will recommend the appropriate medicine.  If you have severe preeclampsia, you may need to be in the hospital. Caregivers will watch you and the baby very closely. You also may need to take medicine (magnesium sulfate) to prevent seizures and lower blood pressure.  Sometimes an early delivery is needed. This may be the case if the condition worsens. It would be done to protect you and the baby. The only cure for preeclampsia is delivery. HOME CARE INSTRUCTIONS  Schedule and keep all of your regular prenatal care.  Follow your caregiver's instructions for taking medicines. Tell your caregiver about all medicines you take. This includes over-the-counter medicines.  Eat as little salt as possible.  Get regular exercise.  Do not drink alcohol.  Do not use tobacco products.  Do not drink products with caffeine.  Lie on your left side when resting.  Tell your doctor if you have any preeclampsia symptoms. SEEK IMMEDIATE MEDICAL CARE IF:  You have severe abdominal pain.  You have sudden swelling in the hands, ankles, or face.  You gain 4 pounds (1.8 kg) or more in 1 week.  You vomit repeatedly.  You have vaginal bleeding.  You do not feel the baby moving as much.  You have a headache.  You have blurred or double vision.  You have muscle twitching or spasms.  You have shortness of breath.  You have blue fingernails and lips.  You have blood in your urine. MAKE SURE YOU:  Understand these instructions.  Will watch your condition.  Will get help right away if you are not doing well. Document Released: 06/15/2011 Document Revised: 12/20/2011 Document Reviewed: 06/15/2011 [employer] Patient Information 2013 [employer].  

## 2013-01-25 LAB — PROTEIN / CREATININE RATIO, URINE
Protein Creatinine Ratio: 0.08 (ref <0.15)
Total Protein, Urine: 7 mg/dL

## 2013-01-31 ENCOUNTER — Ambulatory Visit (INDEPENDENT_AMBULATORY_CARE_PROVIDER_SITE_OTHER): Payer: Medicaid Other | Admitting: Obstetrics and Gynecology

## 2013-01-31 VITALS — BP 150/95 | Temp 97.0°F | Wt 212.3 lb

## 2013-01-31 DIAGNOSIS — O139 Gestational [pregnancy-induced] hypertension without significant proteinuria, unspecified trimester: Secondary | ICD-10-CM

## 2013-01-31 DIAGNOSIS — O133 Gestational [pregnancy-induced] hypertension without significant proteinuria, third trimester: Secondary | ICD-10-CM | POA: Insufficient documentation

## 2013-01-31 DIAGNOSIS — R030 Elevated blood-pressure reading, without diagnosis of hypertension: Secondary | ICD-10-CM

## 2013-01-31 LAB — POCT URINALYSIS DIP (DEVICE)
Bilirubin Urine: NEGATIVE
Glucose, UA: NEGATIVE mg/dL
Ketones, ur: NEGATIVE mg/dL
Leukocytes, UA: NEGATIVE
Protein, ur: NEGATIVE mg/dL
Specific Gravity, Urine: 1.01 (ref 1.005–1.030)

## 2013-01-31 NOTE — Progress Notes (Signed)
Patient states she is avoiding salt to help with some of her swelling as well as drinking lots of water.  She is still swollen with +2 pitting.  Does not c/o headaches or vision changes.   BP 3:01p = 138/92

## 2013-01-31 NOTE — Progress Notes (Signed)
Increased edema but no H/A's. PreE labs all WNL last wk. EFW 7#. D/W Dr. Shawnie Pons re need for FATs, [place] > decision to recheck BP and sx's in 2 days. Rest and kick counts. days.

## 2013-01-31 NOTE — Patient Instructions (Signed)
Fetal Movement Counts Patient Name: __________________________________________________ Patient Due Date: ____________________ Kick counts is highly recommended in high risk pregnancies, but it is a good idea for every pregnant woman to do. Start counting fetal movements at 28 weeks of the pregnancy. Fetal movements increase after eating a full meal or eating or drinking something sweet (the blood sugar is higher). It is also important to drink plenty of fluids (well hydrated) before doing the count. Lie on your left side because it helps with the circulation or you can sit in a comfortable chair with your arms over your belly (abdomen) with no distractions around you. DOING THE COUNT  Try to do the count the same time of day each time you do it.  Mark the day and time, then see how long it takes for you to feel 10 movements (kicks, flutters, swishes, rolls). You should have at least 10 movements within 2 hours. You will most likely feel 10 movements in much less than 2 hours. If you do not, wait an hour and count again. After a couple of days you will see a pattern.  What you are looking for is a change in the pattern or not enough counts in 2 hours. Is it taking longer in time to reach 10 movements? SEEK MEDICAL CARE IF:  You feel less than 10 counts in 2 hours. Tried twice.  No movement in one hour.  The pattern is changing or taking longer each day to reach 10 counts in 2 hours.  You feel the baby is not moving as it usually does. Date: ____________ Movements: ____________ Start time: ____________ Finish time: ____________  Date: ____________ Movements: ____________ Start time: ____________ Finish time: ____________ Date: ____________ Movements: ____________ Start time: ____________ Finish time: ____________ Date: ____________ Movements: ____________ Start time: ____________ Finish time: ____________ Date: ____________ Movements: ____________ Start time: ____________ Finish time:  ____________ Date: ____________ Movements: ____________ Start time: ____________ Finish time: ____________ Date: ____________ Movements: ____________ Start time: ____________ Finish time: ____________ Date: ____________ Movements: ____________ Start time: ____________ Finish time: ____________  Date: ____________ Movements: ____________ Start time: ____________ Finish time: ____________ Date: ____________ Movements: ____________ Start time: ____________ Finish time: ____________ Date: ____________ Movements: ____________ Start time: ____________ Finish time: ____________ Date: ____________ Movements: ____________ Start time: ____________ Finish time: ____________ Date: ____________ Movements: ____________ Start time: ____________ Finish time: ____________ Date: ____________ Movements: ____________ Start time: ____________ Finish time: ____________ Date: ____________ Movements: ____________ Start time: ____________ Finish time: ____________  Date: ____________ Movements: ____________ Start time: ____________ Finish time: ____________ Date: ____________ Movements: ____________ Start time: ____________ Finish time: ____________ Date: ____________ Movements: ____________ Start time: ____________ Finish time: ____________ Date: ____________ Movements: ____________ Start time: ____________ Finish time: ____________ Date: ____________ Movements: ____________ Start time: ____________ Finish time: ____________ Date: ____________ Movements: ____________ Start time: ____________ Finish time: ____________ Date: ____________ Movements: ____________ Start time: ____________ Finish time: ____________  Date: ____________ Movements: ____________ Start time: ____________ Finish time: ____________ Date: ____________ Movements: ____________ Start time: ____________ Finish time: ____________ Date: ____________ Movements: ____________ Start time: ____________ Finish time: ____________ Date: ____________ Movements:  ____________ Start time: ____________ Finish time: ____________ Date: ____________ Movements: ____________ Start time: ____________ Finish time: ____________ Date: ____________ Movements: ____________ Start time: ____________ Finish time: ____________ Date: ____________ Movements: ____________ Start time: ____________ Finish time: ____________  Date: ____________ Movements: ____________ Start time: ____________ Finish time: ____________ Date: ____________ Movements: ____________ Start time: ____________ Finish time: ____________ Date: ____________ Movements: ____________ Start time: ____________ Finish time: ____________ Date: ____________ Movements:   ____________ Start time: ____________ Finish time: ____________ Date: ____________ Movements: ____________ Start time: ____________ Finish time: ____________ Date: ____________ Movements: ____________ Start time: ____________ Finish time: ____________ Date: ____________ Movements: ____________ Start time: ____________ Finish time: ____________  Date: ____________ Movements: ____________ Start time: ____________ Finish time: ____________ Date: ____________ Movements: ____________ Start time: ____________ Finish time: ____________ Date: ____________ Movements: ____________ Start time: ____________ Finish time: ____________ Date: ____________ Movements: ____________ Start time: ____________ Finish time: ____________ Date: ____________ Movements: ____________ Start time: ____________ Finish time: ____________ Date: ____________ Movements: ____________ Start time: ____________ Finish time: ____________ Date: ____________ Movements: ____________ Start time: ____________ Finish time: ____________  Date: ____________ Movements: ____________ Start time: ____________ Finish time: ____________ Date: ____________ Movements: ____________ Start time: ____________ Finish time: ____________ Date: ____________ Movements: ____________ Start time: ____________ Finish  time: ____________ Date: ____________ Movements: ____________ Start time: ____________ Finish time: ____________ Date: ____________ Movements: ____________ Start time: ____________ Finish time: ____________ Date: ____________ Movements: ____________ Start time: ____________ Finish time: ____________ Date: ____________ Movements: ____________ Start time: ____________ Finish time: ____________  Date: ____________ Movements: ____________ Start time: ____________ Finish time: ____________ Date: ____________ Movements: ____________ Start time: ____________ Finish time: ____________ Date: ____________ Movements: ____________ Start time: ____________ Finish time: ____________ Date: ____________ Movements: ____________ Start time: ____________ Finish time: ____________ Date: ____________ Movements: ____________ Start time: ____________ Finish time: ____________ Date: ____________ Movements: ____________ Start time: ____________ Finish time: ____________ Document Released: 10/27/2006 Document Revised: 12/20/2011 Document Reviewed: 04/29/2009 [employer] Patient Information 2013 [employer]. Round Ligament Pain The round ligament is made up of muscle and fibrous tissue. It is attached to the uterus near the fallopian tube. The round ligament is located on both sides of the uterus and helps support the position of the uterus. It usually begins in the second trimester of pregnancy when the uterus comes out of the pelvis. The pain can come and go until the baby is delivered. Round ligament pain is not a serious problem and does not cause harm to the baby. CAUSE During pregnancy the uterus grows the most from the second trimester to delivery. As it grows, it stretches and slightly twists the round ligaments. When the uterus leans from one side to the other, the round ligament on the opposite side pulls and stretches. This can cause pain. SYMPTOMS  Pain can occur on one side or both sides. The pain is usually a  short, sharp, and pinching-like. Sometimes it can be a dull, lingering and aching pain. The pain is located in the lower side of the abdomen or in the groin. The pain is internal and usually starts deep in the groin and moves up to the outside of the hip area. Pain can occur with:  Sudden change in position like getting out of bed or a chair.  Rolling over in bed.  Coughing or sneezing.  Walking too much.  Any type of physical activity. DIAGNOSIS  Your caregiver will make sure there are no serious problems causing the pain. When nothing serious is found, the symptoms usually indicate that the pain is from the round ligament. TREATMENT   Sit down and relax when the pain starts.  Flex your knees up to your belly.  Lay on your side with a pillow under your belly (abdomen) and another one between your legs.  Sit in a hot bath for 15 to 20 minutes or until the pain goes away. HOME CARE INSTRUCTIONS   Only take over-the-counter or prescriptions medicines for pain, discomfort or fever as directed by   your caregiver.  Sit and stand slowly.  Avoid long walks if it causes pain.  Stop or lessen your physical activities if it causes pain. SEEK MEDICAL CARE IF:   The pain does not go away with any of your treatment.  You need stronger medication for the pain.  You develop back pain that you did not have before with the side pain. SEEK IMMEDIATE MEDICAL CARE IF:   You develop a temperature of 102 F (38.9 C) or higher.  You develop uterine contractions.  You develop vaginal bleeding.  You develop nausea, vomiting or diarrhea.  You develop chills.  You have pain when you urinate. Document Released: 07/06/2008 Document Revised: 12/20/2011 Document Reviewed: 07/06/2008 [employer] Patient Information 2013 [employer].  

## 2013-02-02 ENCOUNTER — Encounter (HOSPITAL_COMMUNITY): Payer: Self-pay | Admitting: *Deleted

## 2013-02-02 ENCOUNTER — Inpatient Hospital Stay (HOSPITAL_COMMUNITY)
Admission: AD | Admit: 2013-02-02 | Discharge: 2013-02-08 | DRG: 774 | Disposition: A | Discharge status: Home / Self Care | Payer: Medicaid Other | Source: Ambulatory Visit | Attending: Obstetrics & Gynecology | Admitting: Obstetrics & Gynecology

## 2013-02-02 ENCOUNTER — Other Ambulatory Visit: Payer: Self-pay | Admitting: Obstetrics & Gynecology

## 2013-02-02 ENCOUNTER — Ambulatory Visit: Payer: Medicaid Other

## 2013-02-02 VITALS — BP 140/99 | HR 72

## 2013-02-02 DIAGNOSIS — O139 Gestational [pregnancy-induced] hypertension without significant proteinuria, unspecified trimester: Principal | ICD-10-CM | POA: Diagnosis present

## 2013-02-02 DIAGNOSIS — O0930 Supervision of pregnancy with insufficient antenatal care, unspecified trimester: Secondary | ICD-10-CM

## 2013-02-02 DIAGNOSIS — Z22330 Carrier of Group B streptococcus: Secondary | ICD-10-CM

## 2013-02-02 DIAGNOSIS — O99892 Other specified diseases and conditions complicating childbirth: Secondary | ICD-10-CM | POA: Diagnosis present

## 2013-02-02 DIAGNOSIS — O368190 Decreased fetal movements, unspecified trimester, not applicable or unspecified: Secondary | ICD-10-CM | POA: Diagnosis present

## 2013-02-02 DIAGNOSIS — O163 Unspecified maternal hypertension, third trimester: Secondary | ICD-10-CM

## 2013-02-02 DIAGNOSIS — O26842 Uterine size-date discrepancy, second trimester: Secondary | ICD-10-CM

## 2013-02-02 LAB — POCT URINALYSIS DIP (DEVICE)
Bilirubin Urine: NEGATIVE
Glucose, UA: NEGATIVE mg/dL
Hgb urine dipstick: NEGATIVE
Ketones, ur: NEGATIVE mg/dL
Leukocytes, UA: NEGATIVE
Nitrite: NEGATIVE

## 2013-02-02 LAB — PROTEIN / CREATININE RATIO, URINE
Creatinine, Urine: 73.34 mg/dL
Protein Creatinine Ratio: 0.37 — ABNORMAL HIGH (ref 0.00–0.15)

## 2013-02-02 LAB — COMPREHENSIVE METABOLIC PANEL
ALT: 16 U/L (ref 0–35)
Albumin: 2.6 g/dL — ABNORMAL LOW (ref 3.5–5.2)
Alkaline Phosphatase: 141 U/L — ABNORMAL HIGH (ref 39–117)
BUN: 11 mg/dL (ref 6–23)
Calcium: 9.4 mg/dL (ref 8.4–10.5)
Potassium: 4.3 mEq/L (ref 3.5–5.1)
Sodium: 136 mEq/L (ref 135–145)
Total Protein: 5.7 g/dL — ABNORMAL LOW (ref 6.0–8.3)

## 2013-02-02 LAB — CBC
Platelets: 188 10*3/uL (ref 150–400)
RBC: 3.86 MIL/uL — ABNORMAL LOW (ref 3.87–5.11)
WBC: 9.4 10*3/uL (ref 4.0–10.5)

## 2013-02-02 LAB — RPR: RPR Ser Ql: NONREACTIVE

## 2013-02-02 MED ORDER — LACTATED RINGERS IV SOLN: 500.0000 mL | INTRAVENOUS | Status: DC | PRN

## 2013-02-02 MED ORDER — IBUPROFEN 600 MG PO TABS: 600.0000 mg | ORAL_TABLET | Freq: Four times a day (QID) | ORAL | Status: DC | PRN

## 2013-02-02 MED ORDER — FLEET ENEMA 7-19 GM/118ML RE ENEM: 1.0000 | ENEMA | RECTAL | Status: DC | PRN

## 2013-02-02 MED ORDER — LACTATED RINGERS IV SOLN
INTRAVENOUS | Status: DC
  Administered 2013-02-02 – 2013-02-05 (×8): via INTRAVENOUS

## 2013-02-02 MED ORDER — ZOLPIDEM TARTRATE 5 MG PO TABS
5.0000 mg | ORAL_TABLET | Freq: Once | ORAL | Status: AC
  Administered 2013-02-02: 5 mg via ORAL
  Filled 2013-02-02: qty 1

## 2013-02-02 MED ORDER — TERBUTALINE SULFATE 1 MG/ML IJ SOLN: 0.2500 mg | Freq: Once | INTRAMUSCULAR | Status: AC | PRN

## 2013-02-02 MED ORDER — NALBUPHINE SYRINGE 5 MG/0.5 ML: 10.0000 mg | INJECTION | INTRAMUSCULAR | Status: DC | PRN

## 2013-02-02 MED ORDER — ACETAMINOPHEN 325 MG PO TABS: 650.0000 mg | ORAL_TABLET | ORAL | Status: DC | PRN

## 2013-02-02 MED ORDER — CITRIC ACID-SODIUM CITRATE 334-500 MG/5ML PO SOLN: 30.0000 mL | ORAL | Status: DC | PRN

## 2013-02-02 MED ORDER — DEXTROSE 5 % IV SOLN
5.0000 10*6.[IU] | Freq: Once | INTRAVENOUS | Status: AC
  Administered 2013-02-02: 5 10*6.[IU] via INTRAVENOUS
  Filled 2013-02-02: qty 5

## 2013-02-02 MED ORDER — MISOPROSTOL 25 MCG QUARTER TABLET
25.0000 ug | ORAL_TABLET | ORAL | Status: DC | PRN
  Administered 2013-02-02 (×2): 25 ug via VAGINAL
  Filled 2013-02-02 (×2): qty 0.25
  Filled 2013-02-02: qty 1

## 2013-02-02 MED ORDER — OXYCODONE-ACETAMINOPHEN 5-325 MG PO TABS
1.0000 | ORAL_TABLET | ORAL | Status: DC | PRN
  Administered 2013-02-05 (×2): 1 via ORAL
  Filled 2013-02-02 (×2): qty 1

## 2013-02-02 MED ORDER — LIDOCAINE HCL (PF) 1 % IJ SOLN
30.0000 mL | INTRAMUSCULAR | Status: AC | PRN
  Administered 2013-02-06: 30 mL via SUBCUTANEOUS
  Filled 2013-02-02 (×2): qty 30

## 2013-02-02 MED ORDER — OXYTOCIN 40 UNITS IN LACTATED RINGERS INFUSION - SIMPLE MED: 62.5000 mL/h | INTRAVENOUS | Status: DC

## 2013-02-02 MED ORDER — DEXTROSE 5 % IV SOLN
2.5000 10*6.[IU] | INTRAVENOUS | Status: DC
  Administered 2013-02-02 – 2013-02-06 (×23): 2.5 10*6.[IU] via INTRAVENOUS
  Filled 2013-02-02 (×28): qty 2.5

## 2013-02-02 MED ORDER — OXYTOCIN BOLUS FROM INFUSION: 500.0000 mL | INTRAVENOUS | Status: DC

## 2013-02-02 MED ORDER — ONDANSETRON HCL 4 MG/2ML IJ SOLN: 4.0000 mg | Freq: Four times a day (QID) | INTRAMUSCULAR | Status: DC | PRN

## 2013-02-02 NOTE — Progress Notes (Signed)
Patient called RN to room c/o face feeling very hot, "think my blood pressure is high".  Also c/o low back pain, does not seem to related to uc's.  Patient feels it is from laying on inverted bedpan for foley bulb placement.  Ice given to patient for back.

## 2013-02-02 NOTE — H&P (Signed)
Attestation of Attending Supervision of Advanced Practitioner (CNM/NP)/Fellow: Evaluation and management procedures were performed by the Advanced Practitioner under my supervision and collaboration.  I have reviewed the Advanced Practitioner's note and chart, and I agree with the management and plan.  HARRAWAY-SMITH, Arin Vanosdol 11:46 AM

## 2013-02-02 NOTE — Progress Notes (Signed)
Patient ID: Candis Shine, female   DOB: 09/02/1986, 27 y.o.   MRN: [id number]   S: pt feeling mild occasional cramping.  O:  Filed Vitals:   02/02/13 1141 02/02/13 1312 02/02/13 1405 02/02/13 1500  BP: 136/86 136/78 135/79 120/63  Pulse: 68 65 72 59  Temp:   98.2 F (36.8 C)   TempSrc:   Oral   Resp:  18 18 18   Height:      Weight:        Cervix:  Closed/50/-3  FHTs:  135, mod var, accels present, one early TOCO:  Ctx q 2-3 min  Results for orders placed during the hospital encounter of 02/02/13 (from the past 24 hour(s))  CBC     Status: Abnormal   Collection Time    02/02/13 10:20 AM      Result Value Range   WBC 9.4  4.0 - 10.5 K/uL   RBC 3.86 (*) 3.87 - 5.11 MIL/uL   Hemoglobin 12.3  12.0 - 15.0 g/dL   HCT 56.3 (*) 87.5 - 64.3 %   MCV 92.7  78.0 - 100.0 fL   MCH 31.9  26.0 - 34.0 pg   MCHC 34.4  30.0 - 36.0 g/dL   RDW 32.9  51.8 - 84.1 %   Platelets 188  150 - 400 K/uL  TYPE AND SCREEN     Status: None   Collection Time    02/02/13 10:20 AM      Result Value Range   ABO/RH(D) B NEG     Antibody Screen POS     Sample Expiration 02/05/2013     DAT, IgG NEG    RPR     Status: None   Collection Time    02/02/13 10:20 AM      Result Value Range   RPR NON REACTIVE  NON REACTIVE  COMPREHENSIVE METABOLIC PANEL     Status: Abnormal   Collection Time    02/02/13 10:20 AM      Result Value Range   Sodium 136  135 - 145 mEq/L   Potassium 4.3  3.5 - 5.1 mEq/L   Chloride 106  96 - 112 mEq/L   CO2 17 (*) 19 - 32 mEq/L   Glucose, Bld 85  70 - 99 mg/dL   BUN 11  6 - 23 mg/dL   Creatinine, Ser 6.60  0.50 - 1.10 mg/dL   Calcium 9.4  8.4 - 63.0 mg/dL   Total Protein 5.7 (*) 6.0 - 8.3 g/dL   Albumin 2.6 (*) 3.5 - 5.2 g/dL   AST 17  0 - 37 U/L   ALT 16  0 - 35 U/L   Alkaline Phosphatase 141 (*) 39 - 117 U/L   Total Bilirubin 0.4  0.3 - 1.2 mg/dL   GFR calc non Af Amer >90  >90 mL/min   GFR calc Af Amer >90  >90 mL/min  PROTEIN / CREATININE RATIO, URINE      Status: Abnormal   Collection Time    02/02/13 11:55 AM      Result Value Range   Creatinine, Urine 73.34     Total Protein, Urine 27.5     PROTEIN CREATININE RATIO 0.37 (*) 0.00 - 0.15     A/P - IOL for GHTN at 38.4 - No progression after one dose cytotec. Unable to place FB yet. - Continue cytotec - BP normal. Protein: creatinine ratio elevated.  Will monitor and start mag if severe features.  Napoleon Form, MD

## 2013-02-02 NOTE — H&P (Addendum)
Jenny [name] is a 27 y.o. female presenting for IOL for gestational hypertension. Elevated BP 140-150s/90s last 3 visits.  Initial PIH work-up 4/16 - urine ZOX:WRUEA 0.08, nml LFTs and platelets. Pt having some tightening and pressure last night but no painful ctx. No bleeding or loss of fluid. Decreased fetal movement since last night. No headache or vision changes but eyes "tired." No RUQ pain. Increased swelling in hands.  Seen in [facility], late prenatal care, no complications until recent GHTN. BP 145/92 on 01/24/13. 150/95 on 01/31/13. In clinic on 02/02/13, two blood pressures in 140s/90s.  Maternal Medical History:  Fetal activity: Perceived fetal activity is decreased.   Last perceived fetal movement was within the past hour.    Prenatal complications: PIH.   Prenatal Complications - Diabetes: none.    OB History   Grav Para Term Preterm Abortions TAB SAB Ect Mult Living   2    1  1         Past Medical History  Diagnosis Date  . Shortness of breath     when running   Past Surgical History  Procedure Laterality Date  . Lip repair     Family History: family history includes Diabetes in her paternal grandfather and paternal grandmother and Stroke in her maternal grandfather, maternal grandmother, and paternal grandmother. Social History:  reports that she has never smoked. She has never used smokeless tobacco. She reports that she does not drink alcohol or use illicit drugs.   Prenatal Transfer Tool  Maternal Diabetes: No Genetic Screening: Declined Maternal Ultrasounds/Referrals: Normal Fetal Ultrasounds or other Referrals:  None Maternal Substance Abuse:  No Significant Maternal Medications:  None Significant Maternal Lab Results:  Lab values include: Group B Strep positive Other Comments:  Gestational hypertension  ROS pertinent positives listed in HPI    Last menstrual period 05/27/2012. Maternal Exam:  Abdomen: Fetal presentation: vertex  Introitus: Normal  vulva. Normal vagina.  Vagina is negative for discharge.  Pelvis: adequate for delivery.   Cervix: Cervix evaluated by digital exam.     Fetal Exam Fetal Monitor Review: Mode: ultrasound.   Baseline rate: 135.  Variability: moderate (6-25 bpm).   Pattern: accelerations present and no decelerations.    Fetal State Assessment: Category I - tracings are normal.     Physical Exam  Constitutional: She is oriented to person, place, and time. She appears well-developed and well-nourished. No distress.  HENT:  Head: Normocephalic and atraumatic.  Eyes: Conjunctivae and EOM are normal.  Neck: Normal range of motion. Neck supple.  Cardiovascular: Normal rate.   Respiratory: Effort normal. No respiratory distress.  GI: Soft. There is no tenderness. There is no guarding.  Genitourinary: Vagina normal. No vaginal discharge found.  Musculoskeletal: She exhibits edema (2+, pitting in legs, 1+ in hands).  Neurological: She is alert and oriented to person, place, and time.  Skin: Skin is warm and dry.  Psychiatric: She has a normal mood and affect.    Cervix:  0/50/-3  Prenatal labs: ABO, Rh: B/NEG/-- (01/23 1200) Antibody: NEG (02/06 1700) Rubella: 2.07 (01/23 1200) RPR: NON REAC (02/05 1506)  HBsAg: NEGATIVE (01/23 1200)  HIV: NON REACTIVE (01/23 1200)  GBS:   positive  Assessment/Plan: 27 y.o. G2P0010 at [redacted]w[redacted]d with gestational HTN. - Admit to L&D for induction - cytotec - PCN for GBS prophylaxis - PIH labs - no mag unless severe pressures, symptoms or proteinuria - Anticipate SVD  Napoleon Form 02/02/2013, 9:46 AM

## 2013-02-03 ENCOUNTER — Encounter (HOSPITAL_COMMUNITY): Payer: Self-pay | Admitting: *Deleted

## 2013-02-03 MED ORDER — TERBUTALINE SULFATE 1 MG/ML IJ SOLN: 0.2500 mg | Freq: Once | INTRAMUSCULAR | Status: AC | PRN

## 2013-02-03 MED ORDER — FENTANYL CITRATE 0.05 MG/ML IJ SOLN
100.0000 ug | INTRAMUSCULAR | Status: DC | PRN
  Filled 2013-02-03: qty 2

## 2013-02-03 MED ORDER — OXYTOCIN 40 UNITS IN LACTATED RINGERS INFUSION - SIMPLE MED
1.0000 m[IU]/min | INTRAVENOUS | Status: DC
  Administered 2013-02-03: 1 m[IU]/min via INTRAVENOUS
  Filled 2013-02-03 (×2): qty 1000

## 2013-02-03 NOTE — Progress Notes (Signed)
Jenny [name] is a 27 y.o. G2P0010 at [redacted]w[redacted]d admitted for induction of labor due to [facility].  Subjective: Resting, no complaints.  Denies ha, scotomata, ruq/epigastric pain, n/v.    Objective: BP 115/57  Pulse 53  Temp(Src) 98.4 F (36.9 C) (Oral)  Resp 18  Ht 5\' 5"  (1.651 m)  Wt 96.163 kg (212 lb)  BMI 35.28 kg/m2  SpO2 98%  LMP 05/27/2012 I/O last 3 completed shifts: In: -  Out: 800 [Urine:800]    FHT:  FHR: 135 bpm, variability: moderate,  accelerations:  Present,  decelerations:  Absent UC:   Mild, irregular SVE:   Dilation: 1 Effacement (%): 50 Station: -3 Exam by:: Dr. Thad Ranger at time of cervical foley bulb placement SVE deferred at this time- foley bulb still in- some blood in tubing, doesn't budge w/ gentle traction  Labs: Lab Results  Component Value Date   WBC 9.4 02/02/2013   HGB 12.3 02/02/2013   HCT 35.8* 02/02/2013   MCV 92.7 02/02/2013   PLT 188 02/02/2013    Assessment / Plan: IOL d/t GHTN, cervical ripening phase  Labor: n/a Preeclampsia:  labs stable and no s/s Fetal Wellbeing:  Category I Pain Control:  n/a I/D:  pcn for gbs pos Anticipated MOD:  NSVD  Jenny [name] 02/03/2013, 8:39 AM

## 2013-02-03 NOTE — Progress Notes (Signed)
Jenny [name] is a 27 y.o. G2P0010 at [redacted]w[redacted]d admitted for induction of labor due to [facility].  Subjective: Comfortable, no cramping/uc's, good fm.  Denies ha, scotomata, ruq/epigastric pain, n/v.    Objective: BP 130/88  Pulse 62  Temp(Src) 98 F (36.7 C) (Oral)  Resp 20  Ht 5\' 5"  (1.651 m)  Wt 96.163 kg (212 lb)  BMI 35.28 kg/m2  SpO2 98%  LMP 05/27/2012 I/O last 3 completed shifts: In: -  Out: 800 [Urine:800]    FHT:  FHR: 140 bpm, variability: moderate,  accelerations:  Present,  decelerations:  Absent UC:   Rare, mild SVE:   Dilation: 4.5 Effacement (%): 70 Station: -3 Exam by:: kbooker, cnm Foley bulb out  Labs: Lab Results  Component Value Date   WBC 9.4 02/02/2013   HGB 12.3 02/02/2013   HCT 35.8* 02/02/2013   MCV 92.7 02/02/2013   PLT 188 02/02/2013    Assessment / Plan: IOL d/t GHTN, cervical foley bulb now out, will begin pitocin per protocol  Labor: n/a Preeclampsia:  no s/s Fetal Wellbeing:  Category I Pain Control:  n/a I/D:  PCN for gbs pos Anticipated MOD:  NSVD  Jenny [name] 02/03/2013, 2:48 PM

## 2013-02-03 NOTE — Progress Notes (Signed)
Jenny [name] is a 27 y.o. G2P0010 at [redacted]w[redacted]d admitted for induction of labor due to [facility].  Subjective: Comfortable, no pain- feels a little tightening w/ uc's Denies current ha, scotomata, ruq/epigastric pain, n/v.  Had ha earlier which has resolved  Objective: BP 121/69  Pulse 56  Temp(Src) 98.1 F (36.7 C) (Oral)  Resp 18  Ht 5\' 5"  (1.651 m)  Wt 96.163 kg (212 lb)  BMI 35.28 kg/m2  SpO2 98%  LMP 05/27/2012 I/O last 3 completed shifts: In: -  Out: 800 [Urine:800]    FHT:  FHR: 130 bpm, variability: moderate,  accelerations:  Present,  decelerations:  Absent UC:   irregular, every 2-5 minutes SVE:   Dilation: 4.5 Effacement (%): 80 Station: -3;-2 Exam by:: K Booker CNM Incidental rom w/ SVE- small amt clear fluid, felt to be more like forebag d/t amount of fluid and still feeling like another bag present Do not want to arom remaining bag d/t high station  Labs: Lab Results  Component Value Date   WBC 9.4 02/02/2013   HGB 12.3 02/02/2013   HCT 35.8* 02/02/2013   MCV 92.7 02/02/2013   PLT 188 02/02/2013    Assessment / Plan: IOL d/t GHTN, pitocin currently at 87mu/min, forebag of water now ruptured incidentally w/ sve- continue to increase pitocin per protocol to acheive adequate labor pattern  Labor: n/a Preeclampsia:  no s/s Fetal Wellbeing:  Category I Pain Control:  n/a I/D:  pcn for gbs pos Anticipated MOD:  NSVD  Marge Duncans 02/03/2013, 10:56 PM

## 2013-02-04 MED ORDER — OXYTOCIN 40 UNITS IN LACTATED RINGERS INFUSION - SIMPLE MED
1.0000 m[IU]/min | INTRAVENOUS | Status: DC
  Administered 2013-02-04 – 2013-02-05 (×2): 2 m[IU]/min via INTRAVENOUS
  Filled 2013-02-04 (×2): qty 1000

## 2013-02-04 MED ORDER — ZOLPIDEM TARTRATE 5 MG PO TABS
5.0000 mg | ORAL_TABLET | Freq: Once | ORAL | Status: AC
  Administered 2013-02-04: 5 mg via ORAL
  Filled 2013-02-04: qty 1

## 2013-02-04 NOTE — Progress Notes (Addendum)
Patient ID: Jenny [name], female   DOB: 1986/01/06, 27 y.o.   MRN: [id number] Jenny [name] is a 27 y.o. G2P0010 at [redacted]w[redacted]d undergoing IOL for mild asymptomatic preE  Subjective: Contractions are "tighter," not yet very painful.  Objective: BP 129/56  Pulse 78  Temp(Src) 98.2 F (36.8 C) (Oral)  Resp 12  Ht 5\' 5"  (1.651 m)  Wt 96.163 kg (212 lb)  BMI 35.28 kg/m2  SpO2 98%  LMP 05/27/2012  Fetal Heart FHR: 140 bpm, variability: moderate,  accelerations:  Present,  decelerations:  Absent   Contractions: q 2-3 min, mild (now tracing poorly while on birthing ball)  SVE last check:   Dilation: 4.5 Effacement (%): 50 Station: -2 Exam by:: D. Drakkar Medeiros CNM  VE deferred   Assessment / Plan: Mild preE stable Labor: Early Fetal Wellbeing: Category 1 Pain Control:  Coping well Expected mode of delivery: NSVD  Reese Stockman 02/04/2013, 5:39 PM

## 2013-02-04 NOTE — Progress Notes (Signed)
Tyshawna Alarid is a 27 y.o. G2P0010 at [redacted]w[redacted]d admitted for induction of labor due to [facility].  Subjective: Sleeping, no pain w/ uc's. Denies ha, scotomata, ruq/epigastric pain, n/v.    Objective: BP 120/65  Pulse 53  Temp(Src) 98 F (36.7 C) (Oral)  Resp 18  Ht 5\' 5"  (1.651 m)  Wt 96.163 kg (212 lb)  BMI 35.28 kg/m2  SpO2 98%  LMP 05/27/2012 I/O last 3 completed shifts: In: -  Out: 800 [Urine:800]    FHT:  FHR: 135 bpm, variability: moderate,  accelerations:  Present,  decelerations:  Absent UC:   irregular, every 2-6 minutes SVE:   Dilation: 4.5 Effacement (%): 80 Station: -3;-2 Exam by:: Genella Rife CNM @ [phone]   Labs: Lab Results  Component Value Date   WBC 9.4 02/02/2013   HGB 12.3 02/02/2013   HCT 35.8* 02/02/2013   MCV 92.7 02/02/2013   PLT 188 02/02/2013    Assessment / Plan: IOL d/t GHTN, on pitocin for almost 12hours w/o any pain or cervical change- @ 43mu/min now.  Will d/c for now, and restart @ 0800  Labor: n/a Preeclampsia:  no s/s Fetal Wellbeing:  Category I Pain Control:  n/a I/D:  pcn for gbs pos Anticipated MOD:  NSVD  Marge Duncans 02/04/2013, 2:33 AM

## 2013-02-04 NOTE — Progress Notes (Signed)
Jenny [name] is a 27 y.o. G2P0010 at [redacted]w[redacted]d admitted for IOL 2/2 mild pre-x  Subjective:  Pt is feeling well. She has been unable to sleep. She is not having any pain at this time. Some occasional tightening.  Objective: BP 131/85  Pulse 79  Temp(Src) 97.9 F (36.6 C) (Oral)  Resp 20  Ht 5\' 5"  (1.651 m)  Wt 96.163 kg (212 lb)  BMI 35.28 kg/m2  SpO2 98%  LMP 05/27/2012      FHT:  FHR: 140 bpm, variability: moderate,  accelerations:  Present,  decelerations:  Absent UC:   regular, every 2-3 minutes SVE:   Dilation: 4.5 Effacement (%): 50 Station: -2 Exam by:: D. Poe CNM  Deferred at this time.   Labs: Lab Results  Component Value Date   WBC 9.4 02/02/2013   HGB 12.3 02/02/2013   HCT 35.8* 02/02/2013   MCV 92.7 02/02/2013   PLT 188 02/02/2013    Assessment / Plan: On pitocin at 78mu/min. Not in labor at this time. Will continue pitocin overnight, and give her an ambien to help her sleep tonight.   Labor: Will continue pitocin and give ambien tonight Preeclampsia:  No S/S Fetal Wellbeing:  Category I Pain Control:  Labor support without medications I/D:  n/a Anticipated MOD:  NSVD  Tawnya Crook 02/04/2013, 10:47 PM

## 2013-02-04 NOTE — Progress Notes (Signed)
Jenny [name] is a 27 y.o. G2P0010 at [redacted]w[redacted]d admitted for induction of labor due to Pre-eclamptic toxemia of pregnancy..  Subjective:  Pt is resting and trying to sleep while she can.   Objective: BP 129/80  Pulse 71  Temp(Src) 97.9 F (36.6 C) (Oral)  Resp 20  Ht 5\' 5"  (1.651 m)  Wt 96.163 kg (212 lb)  BMI 35.28 kg/m2  SpO2 98%  LMP 05/27/2012      FHT:  FHR: 145 bpm, variability: moderate,  accelerations:  Present,  decelerations:  Absent UC:   regular, every 2-3 minutes SVE:   Dilation: 4.5 Effacement (%): 50 Station: -2 Exam by:: D. Poe CNM   Labs: Lab Results  Component Value Date   WBC 9.4 02/02/2013   HGB 12.3 02/02/2013   HCT 35.8* 02/02/2013   MCV 92.7 02/02/2013   PLT 188 02/02/2013    Assessment / Plan: Induction of labor due to preeclampsia,  progressing well on pitocin  Labor: pitocin increasing, patient is not uncomfortable at this time.  Preeclampsia:  no S/S Fetal Wellbeing:  Category I Pain Control:  Labor support without medications I/D:  n/a Anticipated MOD:  NSVD  Tawnya Crook 02/04/2013, 10:03 PM

## 2013-02-04 NOTE — Progress Notes (Signed)
Patient ID: Jenny [name], female   DOB: 1986-09-07, 27 y.o.   MRN: [id number] Jenny [name] is a 27 y.o. G2P0010 at [redacted]w[redacted]d admitted for IOL due to [facility], now mild preE. IOL process started 2 days ago. No preE sx.   Subjective: Comfortable. Has had a break from pitocin, showere, light diet and feels better. No contraction pain.   Objective: BP 146/90  Pulse 66  Temp(Src) 98 F (36.7 C) (Axillary)  Resp 18  Ht 5\' 5"  (1.651 m)  Wt 96.163 kg (212 lb)  BMI 35.28 kg/m2  SpO2 98%  LMP 05/27/2012  Fetal Heart FHR: 130 bpm, variability: moderate,  accelerations:  Present,  decelerations:  Absent   Contractions: rare, mild  SVE: posterior 4-5/50/-2 cephalic, Membranes swept>no blood EXT: 2-3+ dep edema, DTRs 1+   Assessment / Plan: Mild stable preE in G2P0010 at [redacted]w[redacted]d Labor: Latent Fetal Wellbeing: Category 1 FHR Pain Control:  N/A Expected mode of delivery: NSVD Will start pitocin and recheck cx when RUCs or as indicated  Jenny [name] 02/04/2013, 11:37 AM

## 2013-02-05 ENCOUNTER — Encounter (HOSPITAL_COMMUNITY): Payer: Self-pay | Admitting: Anesthesiology

## 2013-02-05 ENCOUNTER — Inpatient Hospital Stay (HOSPITAL_COMMUNITY): Payer: Medicaid Other | Admitting: Anesthesiology

## 2013-02-05 LAB — CBC
MCHC: 34 g/dL (ref 30.0–36.0)
MCV: 92.1 fL (ref 78.0–100.0)
Platelets: 148 10*3/uL — ABNORMAL LOW (ref 150–400)
RDW: 14 % (ref 11.5–15.5)
WBC: 11.3 10*3/uL — ABNORMAL HIGH (ref 4.0–10.5)

## 2013-02-05 MED ORDER — LIDOCAINE HCL (PF) 1 % IJ SOLN
INTRAMUSCULAR | Status: DC | PRN
  Administered 2013-02-05 (×4): 4 mL

## 2013-02-05 MED ORDER — PHENYLEPHRINE 40 MCG/ML (10ML) SYRINGE FOR IV PUSH (FOR BLOOD PRESSURE SUPPORT)
80.0000 ug | PREFILLED_SYRINGE | INTRAVENOUS | Status: DC | PRN
  Filled 2013-02-05: qty 2
  Filled 2013-02-05: qty 5

## 2013-02-05 MED ORDER — LACTATED RINGERS IV SOLN: 500.0000 mL | Freq: Once | INTRAVENOUS | Status: DC

## 2013-02-05 MED ORDER — DIPHENHYDRAMINE HCL 50 MG/ML IJ SOLN
12.5000 mg | INTRAMUSCULAR | Status: DC | PRN
Start: 2013-02-05 — End: 2013-02-06

## 2013-02-05 MED ORDER — PHENYLEPHRINE 40 MCG/ML (10ML) SYRINGE FOR IV PUSH (FOR BLOOD PRESSURE SUPPORT)
80.0000 ug | PREFILLED_SYRINGE | INTRAVENOUS | Status: DC | PRN
  Filled 2013-02-05: qty 2

## 2013-02-05 MED ORDER — EPHEDRINE 5 MG/ML INJ
10.0000 mg | INTRAVENOUS | Status: DC | PRN
  Filled 2013-02-05: qty 2
  Filled 2013-02-05: qty 4

## 2013-02-05 MED ORDER — EPHEDRINE 5 MG/ML INJ
10.0000 mg | INTRAVENOUS | Status: DC | PRN
  Filled 2013-02-05: qty 2

## 2013-02-05 MED ORDER — FENTANYL CITRATE 0.05 MG/ML IJ SOLN
100.0000 ug | INTRAMUSCULAR | Status: DC | PRN
  Administered 2013-02-05: 100 ug via INTRAVENOUS

## 2013-02-05 MED ORDER — FENTANYL 2.5 MCG/ML BUPIVACAINE 1/10 % EPIDURAL INFUSION (WH - ANES)
14.0000 mL/h | INTRAMUSCULAR | Status: DC | PRN
  Administered 2013-02-05 – 2013-02-06 (×4): 14 mL/h via EPIDURAL
  Filled 2013-02-05 (×4): qty 125

## 2013-02-05 NOTE — Anesthesia Procedure Notes (Signed)
Epidural Patient location during procedure: OB Start time: 02/05/2013 6:24 PM  Staffing Performed by: anesthesiologist   Preanesthetic Checklist Completed: patient identified, site marked, surgical consent, pre-op evaluation, timeout performed, IV checked, risks and benefits discussed and monitors and equipment checked  Epidural Patient position: sitting Prep: site prepped and draped and DuraPrep Patient monitoring: continuous pulse ox and blood pressure Approach: midline Injection technique: LOR air  Needle:  Needle type: Tuohy  Needle gauge: 17 G Needle length: 9 cm and 9 Needle insertion depth: 5 cm cm Catheter type: closed end flexible Catheter size: 19 Gauge Catheter at skin depth: 10 cm Test dose: negative  Assessment Events: blood not aspirated, injection not painful, no injection resistance, negative IV test and no paresthesia  Additional Notes Discussed risk of headache, infection, bleeding, nerve injury and failed or incomplete block.  Patient asked that I read entire consent form word for word, which I did prior to our signing it.  She also informed me that her back is very tender prior to the procedure.  Patient voices understanding of informed consent and wishes to proceed.  Epidural placed easily on first attempt.  No paresthesia.  Patient tolerated procedure well with no complaints and no apparent complications.  Jasmine December, MD   Reason for block:procedure for pain

## 2013-02-05 NOTE — Progress Notes (Signed)
Patient ID: Candis Shine, female   DOB: [birth year], 27 y.o.   MRN: [id number]   S:  Pt with epidural, comfortable except some R hip pain.  O:   Filed Vitals:   02/05/13 1852 02/05/13 1855 02/05/13 1857 02/05/13 1900  BP:  149/94  142/87  Pulse: 77 67 68 70  Temp:      TempSrc:      Resp:      Height:      Weight:      SpO2: 100% 100% 100% 100%    Dilation: 4.5 Effacement (%): 80;90 Cervical Position: Middle Station: -2;-1 Presentation: Vertex Exam by:: Dr. Thad Ranger  FHTs:  120, mod var, accels present, decel with ctx x 1 (early in timing) TOCO: q 2-3 min  A/P 27 y.o. G2P0010 at [redacted]w[redacted]d here for IOL for GHTN/preeclampsia - no severe features - Day 4 of induction - Pitocin at 16 - Progress in effacement but not dilation - Considerable molding - will try position changes - Place IUPC next check if no change (pt very uncomfortable with IUPC last check and still feeling some pain) - Hopeful for SVD and will continue to keep trying if we can get adequate ctx and make progress  Napoleon Form, MD

## 2013-02-05 NOTE — Progress Notes (Signed)
Jenny [name] is a 27 y.o. G2P0010 at [redacted]w[redacted]d admitted for induction of labor due to Hypertension.  Subjective: Patient felling well s/p fentanyl.  Objective: BP 138/86  Pulse 63  Temp(Src) 98 F (36.7 C) (Oral)  Resp 18  Ht 5\' 5"  (1.651 m)  Wt 212 lb (96.163 kg)  BMI 35.28 kg/m2  SpO2 98%  LMP 05/27/2012      FHT:  FHR: 120 bpm, variability: moderate,  accelerations:  Abscent,  decelerations:  Absent UC:   regular, every 3 minutes SVE:   Dilation: 4.5 Effacement (%): 50 Station: -1 Exam by:: Dr. Jolayne Panther  Labs: Lab Results  Component Value Date   WBC 9.4 02/02/2013   HGB 12.3 02/02/2013   HCT 35.8* 02/02/2013   MCV 92.7 02/02/2013   PLT 188 02/02/2013    Assessment / Plan: Protracted latent phase  Labor: IUPC placed. Continue pitocin augmentation Preeclampsia:  no signs or symptoms of toxicity Fetal Wellbeing:  Category I Pain Control:  Fentanyl Anticipated MOD:  NSVD  Jenny [name] 02/05/2013, 3:06 PM

## 2013-02-05 NOTE — Anesthesia Preprocedure Evaluation (Signed)
Anesthesia Evaluation  Patient identified by MRN, date of birth, ID band Patient awake    Reviewed: Allergy & Precautions, H&P , NPO status , Patient's Chart, lab work & pertinent test results, reviewed documented beta blocker date and time   History of Anesthesia Complications Negative for: history of anesthetic complications  Airway Mallampati: III TM Distance: >3 FB Neck ROM: full    Dental  (+) Teeth Intact   Pulmonary neg pulmonary ROS,  breath sounds clear to auscultation        Cardiovascular hypertension (GHTN), Rhythm:regular Rate:Normal     Neuro/Psych negative neurological ROS  negative psych ROS   GI/Hepatic negative GI ROS, Neg liver ROS,   Endo/Other  Obese BMI 35.4  Renal/GU negative Renal ROS  negative genitourinary   Musculoskeletal   Abdominal   Peds  Hematology negative hematology ROS (+)   Anesthesia Other Findings   Reproductive/Obstetrics (+) Pregnancy                           Anesthesia Physical Anesthesia Plan  ASA: III  Anesthesia Plan: Epidural   Post-op Pain Management:    Induction:   Airway Management Planned:   Additional Equipment:   Intra-op Plan:   Post-operative Plan:   Informed Consent: I have reviewed the patients History and Physical, chart, labs and discussed the procedure including the risks, benefits and alternatives for the proposed anesthesia with the patient or authorized representative who has indicated his/her understanding and acceptance.     Plan Discussed with:   Anesthesia Plan Comments:         Anesthesia Quick Evaluation

## 2013-02-05 NOTE — Progress Notes (Signed)
Jenny [name] is a 27 y.o. G2P0010 at [redacted]w[redacted]d  Subjective: Pt is feeling well. She slept fine last night. She is still not in any pain at this time, other than discomfort for the hospital bed.   Objective: BP 133/81  Pulse 57  Temp(Src) 97.5 F (36.4 C) (Oral)  Resp 20  Ht 5\' 5"  (1.651 m)  Wt 96.163 kg (212 lb)  BMI 35.28 kg/m2  SpO2 98%  LMP 05/27/2012      FHT:  FHR: 140 bpm, variability: moderate,  accelerations:  Present,  decelerations:  Absent UC:   regular, every 2-3 minutes SVE:   Dilation: 4.5 Effacement (%): 50 Station: -1 Exam by:: H. Ho0gan CNM  Labs: Lab Results  Component Value Date   WBC 9.4 02/02/2013   HGB 12.3 02/02/2013   HCT 35.8* 02/02/2013   MCV 92.7 02/02/2013   PLT 188 02/02/2013    Assessment / Plan: On 24 mu/min of pitocin. Still in latent labor  Labor: AROM, clear fluid Preeclampsia:  No S/S Fetal Wellbeing:  Category I Pain Control:  Labor support without medications I/D:  n/a Anticipated MOD:  NSVD  Tawnya Crook 02/05/2013, 7:44 AM

## 2013-02-05 NOTE — H&P (Signed)
Attestation of Attending Supervision of Advanced Practitioner (CNM/NP)/Fellow: Evaluation and management procedures were performed by the Advanced Practitioner under my supervision and collaboration.  I have reviewed the Advanced Practitioner's note and chart, and I agree with the management and plan.  HARRAWAY-SMITH, Nancyjo Givhan 12:29 PM

## 2013-02-05 NOTE — Progress Notes (Signed)
Patient ID: Candis Shine, female   DOB: 06/17/1986, 27 y.o.   MRN: [id number]   S:  Pt not feeling a lot of pain. Occasional sharp pain.  O:  Filed Vitals:   02/05/13 1300 02/05/13 1330 02/05/13 1400 02/05/13 1432  BP: 149/98 102/79 114/65 138/86  Pulse: 64 67 67 63  Temp:   98 F (36.7 C)   TempSrc:   Oral   Resp:   18   Height:      Weight:      SpO2:        Cervix:  4-5/80/-3, posterior  FHTs:  125, mod var, accels present, no decels TOCO:  No picking up ctx well.    Attempted placement of IUPC but not able to advance past completely due to resistance and pt discomfort. Dr. Jolayne Panther also tried.  MVUs after placement about 5 with ctx q 5 min  A/P 27 y.o. G2P0010 at [redacted]w[redacted]d here for IOL for GHTN/preclampsia - No severe symptoms/features - FHTs reactive, Cat I - FB out 4/26 at 2:30 pm (48 hours). On pitocin for 12 hours Saturday (go to 15 milliU/min). Then restarted 4/27 at 11:30 and got to 30 overnight, contracting every 2-3 minutes but just barely feeling ctx. Pitocin d/c today at 9 am (22 hours) and restarted at 2 mu/min at 11:30 am.  Total time on pitocin:  36 hours with no change in cervix.   - ROM time:  SROM > 36 hours.  AROM forebag at 7:30 am today.  Afebrile. - Will increase pitocin to 30 again and monitor MVUs/ctx.  If no change in cervix, will recommend proceed with cesarean section.   Napoleon Form, MD

## 2013-02-05 NOTE — Progress Notes (Signed)
Jenny [name] is a 27 y.o. G2P0010 at [redacted]w[redacted]d admitted for induction of labor due to Hypertension.  Subjective: Patient uncomfortable with contractions and feels that contraction pain is worst with IUPC in place and is requesting to have it removed  Objective: BP 143/91  Pulse 63  Temp(Src) 98.3 F (36.8 C) (Oral)  Resp 22  Ht 5\' 5"  (1.651 m)  Wt 212 lb (96.163 kg)  BMI 35.28 kg/m2  SpO2 98%  LMP 05/27/2012      FHT:  FHR: 120 bpm, variability: moderate,  accelerations:  Present,  decelerations:  Absent UC:   Appear to be every 2-3 minutes. Difficult to monitor as patient is very uncomfortable SVE:   Dilation: 4.5 Effacement (%): 50 Station: -1 Exam by:: Dr. Jolayne Panther  Labs: Lab Results  Component Value Date   WBC 9.4 02/02/2013   HGB 12.3 02/02/2013   HCT 35.8* 02/02/2013   MCV 92.7 02/02/2013   PLT 188 02/02/2013    Assessment / Plan: Protracted latent phase  Labor: Continue labor induction with pitocin Preeclampsia:  no signs or symptoms of toxicity Fetal Wellbeing:  Category I Pain Control:  Labor support without medications Anticipated MOD:  NSVD and IUPC removed. No cervical change noted since last exam. Patient not ready to discuss delivery via c-section.  Verley Pariseau 02/05/2013, 5:37 PM

## 2013-02-06 ENCOUNTER — Encounter (HOSPITAL_COMMUNITY): Payer: Self-pay | Admitting: *Deleted

## 2013-02-06 DIAGNOSIS — O139 Gestational [pregnancy-induced] hypertension without significant proteinuria, unspecified trimester: Secondary | ICD-10-CM

## 2013-02-06 DIAGNOSIS — O9989 Other specified diseases and conditions complicating pregnancy, childbirth and the puerperium: Secondary | ICD-10-CM

## 2013-02-06 LAB — TYPE AND SCREEN
ABO/RH(D): B NEG
Antibody Screen: POSITIVE
DAT, IgG: NEGATIVE
Unit division: 0
Unit division: 0
Unit division: 0

## 2013-02-06 LAB — CBC
Hemoglobin: 11.1 g/dL — ABNORMAL LOW (ref 12.0–15.0)
MCH: 31.2 pg (ref 26.0–34.0)
MCHC: 33.7 g/dL (ref 30.0–36.0)
MCV: 92.4 fL (ref 78.0–100.0)
RBC: 3.56 MIL/uL — ABNORMAL LOW (ref 3.87–5.11)

## 2013-02-06 LAB — SAVE SMEAR

## 2013-02-06 MED ORDER — TETANUS-DIPHTH-ACELL PERTUSSIS 5-2.5-18.5 LF-MCG/0.5 IM SUSP: 0.5000 mL | Freq: Once | INTRAMUSCULAR | Status: DC

## 2013-02-06 MED ORDER — IBUPROFEN 600 MG PO TABS
600.0000 mg | ORAL_TABLET | Freq: Four times a day (QID) | ORAL | Status: DC
  Administered 2013-02-06 – 2013-02-08 (×5): 600 mg via ORAL
  Filled 2013-02-06 (×6): qty 1

## 2013-02-06 MED ORDER — ONDANSETRON HCL 4 MG/2ML IJ SOLN: 4.0000 mg | INTRAMUSCULAR | Status: DC | PRN

## 2013-02-06 MED ORDER — FENTANYL CITRATE 0.05 MG/ML IJ SOLN
INTRAMUSCULAR | Status: AC
  Filled 2013-02-06: qty 2

## 2013-02-06 MED ORDER — OXYTOCIN 40 UNITS IN LACTATED RINGERS INFUSION - SIMPLE MED: 62.5000 mL/h | INTRAVENOUS | Status: DC | PRN

## 2013-02-06 MED ORDER — ZOLPIDEM TARTRATE 5 MG PO TABS: 5.0000 mg | ORAL_TABLET | Freq: Every evening | ORAL | Status: DC | PRN

## 2013-02-06 MED ORDER — MISOPROSTOL 200 MCG PO TABS
ORAL_TABLET | ORAL | Status: AC
  Filled 2013-02-06: qty 5

## 2013-02-06 MED ORDER — MISOPROSTOL 200 MCG PO TABS: 1000.0000 ug | ORAL_TABLET | Freq: Once | ORAL | Status: DC

## 2013-02-06 MED ORDER — METHYLERGONOVINE MALEATE 0.2 MG/ML IJ SOLN
INTRAMUSCULAR | Status: AC
  Filled 2013-02-06: qty 1

## 2013-02-06 MED ORDER — PRENATAL MULTIVITAMIN CH
1.0000 | ORAL_TABLET | Freq: Every day | ORAL | Status: DC
  Administered 2013-02-07 – 2013-02-08 (×2): 1 via ORAL
  Filled 2013-02-06 (×2): qty 1

## 2013-02-06 MED ORDER — ONDANSETRON HCL 4 MG PO TABS: 4.0000 mg | ORAL_TABLET | ORAL | Status: DC | PRN

## 2013-02-06 MED ORDER — DIPHENHYDRAMINE HCL 25 MG PO CAPS: 25.0000 mg | ORAL_CAPSULE | Freq: Four times a day (QID) | ORAL | Status: DC | PRN

## 2013-02-06 MED ORDER — LIDOCAINE HCL 2 % EX GEL: CUTANEOUS | Status: DC

## 2013-02-06 MED ORDER — OXYTOCIN 40 UNITS IN LACTATED RINGERS INFUSION - SIMPLE MED
INTRAVENOUS | Status: AC
  Filled 2013-02-06: qty 1000

## 2013-02-06 MED ORDER — SIMETHICONE 80 MG PO CHEW: 80.0000 mg | CHEWABLE_TABLET | ORAL | Status: DC | PRN

## 2013-02-06 MED ORDER — WITCH HAZEL-GLYCERIN EX PADS: 1.0000 "application " | MEDICATED_PAD | CUTANEOUS | Status: DC | PRN

## 2013-02-06 MED ORDER — CARBOPROST TROMETHAMINE 250 MCG/ML IM SOLN
INTRAMUSCULAR | Status: AC
  Filled 2013-02-06: qty 1

## 2013-02-06 MED ORDER — SENNOSIDES-DOCUSATE SODIUM 8.6-50 MG PO TABS
2.0000 | ORAL_TABLET | Freq: Every day | ORAL | Status: DC
  Administered 2013-02-06 – 2013-02-07 (×2): 2 via ORAL

## 2013-02-06 MED ORDER — LANOLIN HYDROUS EX OINT: TOPICAL_OINTMENT | CUTANEOUS | Status: DC | PRN

## 2013-02-06 MED ORDER — BENZOCAINE-MENTHOL 20-0.5 % EX AERO: 1.0000 "application " | INHALATION_SPRAY | CUTANEOUS | Status: DC | PRN

## 2013-02-06 MED ORDER — CITRIC ACID-SODIUM CITRATE 334-500 MG/5ML PO SOLN
ORAL | Status: AC
  Filled 2013-02-06: qty 15

## 2013-02-06 MED ORDER — OXYCODONE-ACETAMINOPHEN 5-325 MG PO TABS
1.0000 | ORAL_TABLET | ORAL | Status: DC | PRN
  Administered 2013-02-06: 1 via ORAL
  Filled 2013-02-06: qty 1

## 2013-02-06 MED ORDER — DIBUCAINE 1 % RE OINT: 1.0000 "application " | TOPICAL_OINTMENT | RECTAL | Status: DC | PRN

## 2013-02-06 MED ORDER — LIDOCAINE HCL 2 % EX GEL
Freq: Once | CUTANEOUS | Status: DC
  Filled 2013-02-06: qty 5

## 2013-02-06 MED ORDER — MISOPROSTOL 200 MCG PO TABS
ORAL_TABLET | ORAL | Status: AC
  Administered 2013-02-06: 1000 ug
  Filled 2013-02-06: qty 5

## 2013-02-06 NOTE — Progress Notes (Signed)
Peaches Vanoverbeke is a 27 y.o. G2P0010 at [redacted]w[redacted]d  Subjective: Comfortable with epidural; legs very numb L>R; no pressure  Objective: BP 154/93  Pulse 70  Temp(Src) 98.2 F (36.8 C) (Oral)  Resp 16  Ht 5\' 5"  (1.651 m)  Wt 212 lb (96.163 kg)  BMI 35.28 kg/m2  SpO2 100%  LMP 05/27/2012 I/O last 3 completed shifts: In: -  Out: 500 [Urine:500]    FHT:  FHR: 120 bpm, variability: moderate,  accelerations:  Present,  decelerations:  Absent UC:   regular, every 2-4 minutes; IUPC appears to be too far out SVE: complete/vtx and caput at +2 Labs: Lab Results  Component Value Date   WBC 11.3* 02/05/2013   HGB 11.5* 02/05/2013   HCT 33.8* 02/05/2013   MCV 92.1 02/05/2013   PLT 148* 02/05/2013    Assessment / Plan: End 1st stage GHTN  Will begin pushing soon May need to decrease epidural rate if unable to push effectively   Cam Hai 02/06/2013, 12:23 PM

## 2013-02-06 NOTE — Progress Notes (Signed)
Patient ID: Jenny [name], female   DOB: [birth year], 27 y.o.   MRN: [id number]  S:  Pt feeling back and R hip pain still.  But is feeling more pressure.  O: Filed Vitals:   02/06/13 0201 02/06/13 0231 02/06/13 0301 02/06/13 0331  BP: 146/84 136/67 152/86 141/112  Pulse: 65 72 73 77  Temp:    98 F (36.7 C)  TempSrc:    Oral  Resp:   16 16  Height:      Weight:      SpO2:        CERV:  8/100/-1 (caput at +2) FHTs:  120, mod var, accels present, no decels. MVUs:  90-125  A/P 27 y.o. G2P0010 at [redacted]w[redacted]d here for IOL for mild preeclampsia. On mag. - MVUs not adequate but making cervical change. - BP fairly well controlled. Repeat PIH labs. Asymptomatic except for considerable edema - FHTs reactive - WIll labor down for a bit longer and then try pushing. - Hopeful for SVD.  Napoleon Form, MD

## 2013-02-06 NOTE — Progress Notes (Signed)
Patient ID: Candis Shine, female   DOB: 07-Dec-1985, 27 y.o.   MRN: [id number]   S:  Pt comfortable, occasional pressure  O:   Filed Vitals:   02/06/13 0431 02/06/13 0501 02/06/13 0531 02/06/13 0601  BP: 138/91 139/72 140/78 138/78  Pulse: 74 73 80 78  Temp:    98 F (36.7 C)  TempSrc:    Oral  Resp: 16 16 16 16   Height:      Weight:      SpO2:        CERV:  8/100/-1 (caput/tip of molded head at +1)  FHTs:  120, mod var, accels present, no decels TOCO/MVUs:  90-100 (not really picking up well now) - q 2-3 minutes  A/P 27 y.o. G2P0010 at [redacted]w[redacted]d here for IOL since 02/02/13 (day 5) for GHTN/preeclampsia - No severe features but 3+ pitting edema in legs, edematous everywhere, no SOB - Cervix still present with extensive molding of head - could likely push through at this point but not feeling urge at this time.  - Pit at 28 - increase to 30 and see if has urge to push - Hopeful for SVD  Napoleon Form, MD

## 2013-02-06 NOTE — Progress Notes (Signed)
Jenny [name] is a 27 y.o. G2P0010 at [redacted]w[redacted]d   Subjective: Pushing at 2 hrs now; does not feel urge/pressure to push  Objective: BP 173/95  Pulse 67  Temp(Src) 98.4 F (36.9 C) (Oral)  Resp 20  Ht 5\' 5"  (1.651 m)  Wt 212 lb (96.163 kg)  BMI 35.28 kg/m2  SpO2 100%  LMP 05/27/2012 I/O last 3 completed shifts: In: -  Out: 500 [Urine:500]    FHT:  FHR: 130 bpm, variability: moderate,  accelerations:  Present,  decelerations:  Absent UC:   regular, every 2-4 minutes SVE:   Dilation: 10 Effacement (%): 100 Station: +2 Exam by:: shaw,cnm vtx feel sl lower than at last check; pelvis feels roomy  Labs: Lab Results  Component Value Date   WBC 11.3* 02/05/2013   HGB 11.5* 02/05/2013   HCT 33.8* 02/05/2013   MCV 92.1 02/05/2013   PLT 148* 02/05/2013    Assessment / Plan: Pushing x 2 hrs  Will have anesthesia eval for epidural rate being turned down It seems as thought with some rectal pressure pt would be able to direct pushing and make it more effective.  Cam Hai 02/06/2013, 3:27 PM

## 2013-02-07 ENCOUNTER — Encounter: Payer: Medicaid Other | Admitting: Obstetrics and Gynecology

## 2013-02-07 LAB — CBC
HCT: 24.9 % — ABNORMAL LOW (ref 36.0–46.0)
MCH: 31.4 pg (ref 26.0–34.0)
MCV: 91.9 fL (ref 78.0–100.0)
Platelets: 140 10*3/uL — ABNORMAL LOW (ref 150–400)
RBC: 2.71 MIL/uL — ABNORMAL LOW (ref 3.87–5.11)

## 2013-02-07 LAB — FIBRINOGEN: Fibrinogen: 661 mg/dL — ABNORMAL HIGH (ref 204–475)

## 2013-02-07 MED ORDER — ENALAPRIL MALEATE 5 MG PO TABS
5.0000 mg | ORAL_TABLET | Freq: Every day | ORAL | Status: DC
  Administered 2013-02-07 – 2013-02-08 (×2): 5 mg via ORAL
  Filled 2013-02-07 (×3): qty 1

## 2013-02-07 MED ORDER — RHO D IMMUNE GLOBULIN 1500 UNIT/2ML IJ SOLN
300.0000 ug | Freq: Once | INTRAMUSCULAR | Status: AC
  Administered 2013-02-07: 300 ug via INTRAMUSCULAR
  Filled 2013-02-07: qty 2

## 2013-02-07 NOTE — Plan of Care (Signed)
Problem: Phase I Progression Outcomes Goal: Other Phase I Outcomes/Goals Outcome: Completed/Met Date Met:  02/07/13 Continue stabilization after pph

## 2013-02-07 NOTE — Progress Notes (Signed)
UR completed 

## 2013-02-07 NOTE — Anesthesia Postprocedure Evaluation (Signed)
Anesthesia Post Note  Patient: Jenny [name]  Procedure(s) Performed: * No procedures listed *  Anesthesia type: Epidural  Patient location: Mother/Baby  Post pain: Pain level controlled  Post assessment: Post-op Vital signs reviewed  Last Vitals:  Filed Vitals:   02/07/13 0546  BP: 141/84  Pulse: 64  Temp: 36.6 C  Resp: 16    Post vital signs: Reviewed  Level of consciousness:alert  Complications: No apparent anesthesia complications

## 2013-02-07 NOTE — Progress Notes (Signed)
Post Partum Day 1 Subjective: + flatus and has not yet ambulated. still has foley in place and has been NPO overnight. reports some lightheadedness when she tried to get up so she hasn't ambulated yet  Denies headache or vision changes  Objective: Blood pressure 140/96, pulse 63, temperature 98.5 F (36.9 C), temperature source Oral, resp. rate 20, height 5\' 5"  (1.651 m), weight 212 lb (96.163 kg), last menstrual period 05/27/2012, SpO2 98.00%, unknown if currently breastfeeding.  Physical Exam:  General: alert, cooperative and no distress Lochia: appropriate Uterine Fundus: firm Incision: n/a DVT Evaluation: No evidence of DVT seen on physical exam. Negative Homan's sign. No cords or calf tenderness.   Recent Labs  02/06/13 1845 02/07/13 0600  HGB 11.1* 8.5*  HCT 32.9* 24.9*    Assessment/Plan: BP remains elevated (no severe range) so will start enalapril for better control Remove foley, regular diet Discontinue pitocin Breastfeeding - will meet with lactation Plan for discharge tomorrow   LOS: 5 days   Levert Feinstein 02/07/2013, 11:12 AM

## 2013-02-08 LAB — RH IG WORKUP (INCLUDES ABO/RH)
Fetal Screen: NEGATIVE
Unit division: 0

## 2013-02-08 MED ORDER — ENALAPRIL MALEATE 5 MG PO TABS: 5.0000 mg | ORAL_TABLET | Freq: Every day | ORAL | Status: DC

## 2013-02-08 NOTE — Discharge Summary (Signed)
.   Obstetric Discharge Summary Reason for Admission: induction of labor Prenatal Procedures: CHTN Intrapartum Procedures: vacuum Postpartum Procedures: none Complications-Operative and Postpartum: none Hemoglobin  Date Value Range Status  02/07/2013 8.5* 12.0 - 15.0 g/dL Final     REPEATED TO VERIFY     DELTA CHECK NOTED     HCT  Date Value Range Status  02/07/2013 24.9* 36.0 - 46.0 % Final    Physical Exam:  General: alert, cooperative and no distress Lochia: appropriate Uterine Fundus: firm DVT Evaluation: No evidence of DVT seen on physical exam.  Discharge Diagnoses: Term Pregnancy-delivered  Discharge Information: Date: 02/08/2013 Activity: pelvic rest Diet: routine Medications: enalopril Condition: stable Instructions: refer to practice specific booklet Discharge to: home   Newborn Data: Live born female  Birth Weight: 8 lb 3 oz (3714 g) APGAR: 9, 10  Home with mother.  Jenny [name] 02/08/2013, 8:58 AM

## 2013-02-09 ENCOUNTER — Encounter (HOSPITAL_COMMUNITY): Payer: Self-pay | Admitting: *Deleted

## 2013-02-09 ENCOUNTER — Inpatient Hospital Stay (HOSPITAL_COMMUNITY)
Admission: AD | Admit: 2013-02-09 | Discharge: 2013-02-09 | Disposition: A | Discharge status: Home / Self Care | Payer: Medicaid Other | Source: Ambulatory Visit | Attending: Obstetrics & Gynecology | Admitting: Obstetrics & Gynecology

## 2013-02-09 DIAGNOSIS — N390 Urinary tract infection, site not specified: Secondary | ICD-10-CM

## 2013-02-09 DIAGNOSIS — IMO0001 Reserved for inherently not codable concepts without codable children: Secondary | ICD-10-CM

## 2013-02-09 DIAGNOSIS — R109 Unspecified abdominal pain: Secondary | ICD-10-CM | POA: Insufficient documentation

## 2013-02-09 DIAGNOSIS — O2390 Unspecified genitourinary tract infection in pregnancy, unspecified trimester: Secondary | ICD-10-CM | POA: Insufficient documentation

## 2013-02-09 HISTORY — DX: Urinary tract infection, site not specified: N39.0

## 2013-02-09 LAB — URINE MICROSCOPIC-ADD ON

## 2013-02-09 LAB — TYPE AND SCREEN
ABO/RH(D): B NEG
Antibody Screen: POSITIVE
DAT, IgG: NEGATIVE
Unit division: 0
Unit division: 0

## 2013-02-09 LAB — URINALYSIS, ROUTINE W REFLEX MICROSCOPIC
Glucose, UA: NEGATIVE mg/dL
Protein, ur: 100 mg/dL — AB

## 2013-02-09 MED ORDER — OXYCODONE-ACETAMINOPHEN 5-325 MG PO TABS: 1.0000 | ORAL_TABLET | Freq: Four times a day (QID) | ORAL | Status: DC | PRN

## 2013-02-09 MED ORDER — CEPHALEXIN 500 MG PO CAPS: 500.0000 mg | ORAL_CAPSULE | Freq: Three times a day (TID) | ORAL | Status: DC

## 2013-02-09 MED ORDER — HYDROCHLOROTHIAZIDE 25 MG PO TABS: 12.5000 mg | ORAL_TABLET | Freq: Every day | ORAL | Status: DC

## 2013-02-09 MED ORDER — IBUPROFEN 600 MG PO TABS: 600.0000 mg | ORAL_TABLET | Freq: Four times a day (QID) | ORAL | Status: DC | PRN

## 2013-02-09 NOTE — MAU Provider Note (Signed)
  History     CSN: [id number]  Arrival date and time: 02/09/13 1402   None     Chief Complaint  Patient presents with  . Postpartum Complications   HPI 27 y.o. G2P1011 s/p SVD on 02/06/13 (PPD #3) with pain and difficulty urinating since yesterday. Cramping, bladder spasms, sometimes goes only a tablespoon but then feels like she has to urinate again right away. Fever (subjective), chills last night, flank pain, no nausea/vomiting. Also states BP is high and had some transient vision changes (floaters) today. Had gestational hypertension, not treated for preeclampsia. Per pt was having high blood pressures on discharge and headache, but no headache today. Still having lower extremity edema. Has not started enalapril yet.     OB History   Grav Para Term Preterm Abortions TAB SAB Ect Mult Living   2 1 1  0 1 0 1 0 0 1      Past Medical History  Diagnosis Date  . Shortness of breath     when running    Past Surgical History  Procedure Laterality Date  . Lip repair      Family History  Problem Relation Age of Onset  . Diabetes Paternal Grandfather   . Stroke Paternal Grandmother   . Diabetes Paternal Grandmother   . Stroke Maternal Grandmother   . Stroke Maternal Grandfather     History  Substance Use Topics  . Smoking status: Never Smoker   . Smokeless tobacco: Never Used  . Alcohol Use: No    Allergies:  Allergies  Allergen Reactions  . Horseradish (Cochlearia Armoracia) Anaphylaxis    Prescriptions prior to admission  Medication Sig Dispense Refill  . enalapril (VASOTEC) 5 MG tablet Take 5 mg by mouth daily.      . Multiple Vitamin (MULTIVITAMIN) capsule Take 1 capsule by mouth daily.      . [DISCONTINUED] enalapril (VASOTEC) 5 MG tablet Take 1 tablet (5 mg total) by mouth daily.  30 tablet  1    ROS Pertinent pos and neg mentioned in HPI  Physical Exam   Blood pressure 154/98, pulse 81, temperature 98.4 F (36.9 C), temperature source Oral, resp. rate  18, height 5\' 5"  (1.651 m), weight 93.441 kg (206 lb), last menstrual period 05/27/2012, SpO2 100.00%, currently breastfeeding.  Physical Exam  Constitutional: She is oriented to person, place, and time. She appears well-developed and well-nourished. No distress.  HENT:  Head: Normocephalic and atraumatic.  Eyes: Conjunctivae and EOM are normal.  Neck: Normal range of motion. Neck supple.  Cardiovascular: Normal rate, regular rhythm and normal heart sounds.   Respiratory: Effort normal and breath sounds normal. No respiratory distress.  GI: Soft. Bowel sounds are normal. She exhibits no distension. There is no tenderness. There is no rebound and no guarding.  Musculoskeletal: Normal range of motion. She exhibits edema.  Neurological: She is alert and oriented to person, place, and time.  Skin: Skin is warm and dry.    MAU Course  Procedures   Assessment and Plan  27 y.o. G2P1011 ppd #3 s/p VAVD with UTI - Keflex - Motrin/percocet for pain  - Change enalapril to HCTZ. Preeclampsia precautions reviewed. - F/u as scheduled in clinic  Napoleon Form 02/09/2013, 3:24 PM

## 2013-02-09 NOTE — Discharge Summary (Signed)
Agree with above note.  Jenny [name] H. 02/09/2013 10:22 AM

## 2013-02-09 NOTE — Progress Notes (Signed)
States she did not take her BP med. this AM.

## 2013-02-09 NOTE — MAU Note (Signed)
Pt states had vaginal delivery on 02/06/2013, had small hemorrhage and had urinary catheter left in for a while. Now has bladder spasms and will void a tsp of urine, then has a huge amount of urine come out. Is very uncomfortable. Also states ran a fever last pm, had cold chills. Afebrile in triage.

## 2013-02-11 LAB — URINE CULTURE: Colony Count: >=100000

## 2013-02-13 NOTE — Progress Notes (Signed)
I have seen and examined this patient and I agree with the above. Cam Hai 11:27 PM 02/13/2013

## 2013-02-17 ENCOUNTER — Inpatient Hospital Stay (HOSPITAL_COMMUNITY)
Admission: AD | Admit: 2013-02-17 | Discharge: 2013-02-17 | Disposition: A | Discharge status: Home / Self Care | Payer: Medicaid Other | Source: Ambulatory Visit | Attending: Obstetrics & Gynecology | Admitting: Obstetrics & Gynecology

## 2013-02-17 ENCOUNTER — Encounter (HOSPITAL_COMMUNITY): Payer: Self-pay | Admitting: *Deleted

## 2013-02-17 DIAGNOSIS — O8612 Endometritis following delivery: Secondary | ICD-10-CM | POA: Insufficient documentation

## 2013-02-17 DIAGNOSIS — N390 Urinary tract infection, site not specified: Secondary | ICD-10-CM

## 2013-02-17 DIAGNOSIS — R300 Dysuria: Secondary | ICD-10-CM | POA: Insufficient documentation

## 2013-02-17 DIAGNOSIS — N949 Unspecified condition associated with female genital organs and menstrual cycle: Secondary | ICD-10-CM | POA: Insufficient documentation

## 2013-02-17 DIAGNOSIS — O2390 Unspecified genitourinary tract infection in pregnancy, unspecified trimester: Secondary | ICD-10-CM | POA: Insufficient documentation

## 2013-02-17 LAB — URINE MICROSCOPIC-ADD ON

## 2013-02-17 LAB — URINALYSIS, ROUTINE W REFLEX MICROSCOPIC
Glucose, UA: NEGATIVE mg/dL
Protein, ur: NEGATIVE mg/dL
Specific Gravity, Urine: <1.005 — ABNORMAL LOW (ref 1.005–1.030)
pH: 6 (ref 5.0–8.0)

## 2013-02-17 MED ORDER — OXYBUTYNIN CHLORIDE 5 MG PO TABS: 5.0000 mg | ORAL_TABLET | Freq: Three times a day (TID) | ORAL | Status: DC

## 2013-02-17 MED ORDER — SULFAMETHOXAZOLE-TRIMETHOPRIM 800-160 MG PO TABS: 1.0000 | ORAL_TABLET | Freq: Two times a day (BID) | ORAL | Status: DC

## 2013-02-17 MED ORDER — OXYBUTYNIN CHLORIDE 5 MG PO TABS: 5.0000 mg | ORAL_TABLET | Freq: Three times a day (TID) | ORAL | Status: DC | PRN

## 2013-02-17 NOTE — MAU Note (Signed)
Pt reports she has been treated for bladder infection given antibiotics. Stated she is still having pain with urination and had  A fever 102.5 on Thursday.

## 2013-02-17 NOTE — MAU Provider Note (Signed)
History     CSN: [id number]  Arrival date and time: 02/17/13 1703   None     Chief Complaint  Patient presents with  . Urinary Tract Infection   HPI This is a 27 y.o. female who is here with c/o bladder pain and spasm.  Is on Keflex for UTI. Did have a long labor and catheter. Had one fever 3 days ago. No other c/o.  RN Note: Pt reports she has been treated for bladder infection given antibiotics. Stated she is still having pain with urination and had A fever 102.5 on Thursday.       OB History   Grav Para Term Preterm Abortions TAB SAB Ect Mult Living   2 1 1  0 1 0 1 0 0 1      Past Medical History  Diagnosis Date  . Shortness of breath     when running  . Urinary tract infection     Past Surgical History  Procedure Laterality Date  . Lip repair      Family History  Problem Relation Age of Onset  . Diabetes Paternal Grandfather   . Stroke Paternal Grandmother   . Diabetes Paternal Grandmother   . Stroke Maternal Grandmother   . Stroke Maternal Grandfather     History  Substance Use Topics  . Smoking status: Never Smoker   . Smokeless tobacco: Never Used  . Alcohol Use: No    Allergies:  Allergies  Allergen Reactions  . Horseradish (Cochlearia Armoracia) Anaphylaxis    Prescriptions prior to admission  Medication Sig Dispense Refill  . ibuprofen (ADVIL,MOTRIN) 600 MG tablet Take 1 tablet (600 mg total) by mouth every 6 (six) hours as needed for pain.  30 tablet  1  . Multiple Vitamin (MULTIVITAMIN) capsule Take 1 capsule by mouth daily.      Marland Kitchen oxyCODONE-acetaminophen (PERCOCET/ROXICET) 5-325 MG per tablet Take 1 tablet by mouth every 6 (six) hours as needed for pain.  15 tablet  0  . PRESCRIPTION MEDICATION Take 1 tablet by mouth daily. Prescription blood pressure medication      . hydrochlorothiazide (HYDRODIURIL) 25 MG tablet Take 0.5 tablets (12.5 mg total) by mouth daily.  30 tablet  0  . [DISCONTINUED] cephALEXin (KEFLEX) 500 MG capsule Take 1  capsule (500 mg total) by mouth 3 (three) times daily.  15 capsule  0    Review of Systems  Constitutional: Positive for fever and malaise/fatigue. Negative for chills.  Gastrointestinal: Positive for abdominal pain. Negative for nausea, vomiting, diarrhea and constipation.  Genitourinary: Positive for dysuria.  Musculoskeletal: Negative for myalgias.  Neurological: Negative for weakness and headaches.   Physical Exam   Blood pressure 116/97, pulse 80, temperature 98 F (36.7 C), temperature source Oral, resp. rate 18, weight 182 lb 12.8 oz (82.918 kg), last menstrual period 05/27/2012, currently breastfeeding.  Physical Exam  Constitutional: She is oriented to person, place, and time. She appears well-developed and well-nourished. No distress.  Cardiovascular: Normal rate.   Respiratory: Effort normal.  GI: Soft.  Genitourinary: Uterus normal. Vaginal discharge found.  Uterus firm, slightly tender Perineum healing Some tenderness over suprapubic area   Musculoskeletal: Normal range of motion.  Neurological: She is alert and oriented to person, place, and time.  Skin: Skin is warm and dry.  Psychiatric: She has a normal mood and affect.    MAU Course  Procedures  MDM Last urine culture was mixed flora.  Did have long labor with internal monitors, could be endometritis Will  reculture urine and change antibiotic  Assessment and Plan  A:  Postpartum 11 days      Bladder/pelvic pain      UTI vs endometritis  P:  Discharge        Stop Keflex       Rx Septra DS and Ditropan for spasms       Followup in clinic  [facility] 02/17/2013, 6:57 PM

## 2013-02-19 LAB — URINE CULTURE

## 2013-02-21 NOTE — Progress Notes (Signed)
Patient came in for blood pressure check (140/99).  Patient was sent to MAU.

## 2013-03-07 ENCOUNTER — Ambulatory Visit (INDEPENDENT_AMBULATORY_CARE_PROVIDER_SITE_OTHER): Payer: Medicaid Other | Admitting: Obstetrics and Gynecology

## 2013-03-07 ENCOUNTER — Encounter: Payer: Self-pay | Admitting: Obstetrics and Gynecology

## 2013-03-07 ENCOUNTER — Other Ambulatory Visit: Payer: Self-pay | Admitting: Obstetrics and Gynecology

## 2013-03-07 DIAGNOSIS — Z30430 Encounter for insertion of intrauterine contraceptive device: Secondary | ICD-10-CM

## 2013-03-07 MED ORDER — PARAGARD INTRAUTERINE COPPER IU IUD
1.0000 | INTRAUTERINE_SYSTEM | Freq: Once | INTRAUTERINE | Status: AC
  Administered 2013-03-07: 1 via INTRAUTERINE

## 2013-03-07 NOTE — Addendum Note (Signed)
Addended by: Toula Moos on: 03/07/2013 03:30 PM   Modules accepted: Orders

## 2013-03-07 NOTE — Progress Notes (Signed)
  Subjective:     Jenny [name] is a 27 y.o. female who presents for a postpartum visit. She is 4 weeks postpartum following a vacuum assisted vaginal delivery. I have fully reviewed the prenatal and intrapartum course. The delivery was at 38.4 gestational weeks. Outcome: spontaneous vaginal delivery and vacuum, low. Anesthesia: epidural. Postpartum course has been complicated by postpartum hemorrhage. Baby's course has been uncomplicated. Baby is feeding by breast. Bleeding staining only. Bowel function is normal. Bladder function is normal. Patient is not sexually active. Contraception method is none. Postpartum depression screening: negative. Patient is interested in Paraguard IUD for birth control     Review of Systems A comprehensive review of systems was negative.   Objective:    BP 123/86  Pulse 88  Ht 5\' 5"  (1.651 m)  Wt 176 lb 8 oz (80.06 kg)  BMI 29.37 kg/m2  Breastfeeding? Yes  General:  alert, cooperative and no distress   Breasts:  inspection negative, no nipple discharge or bleeding, no masses or nodularity palpable  Lungs: clear to auscultation bilaterally  Heart:  regular rate and rhythm  Abdomen: soft, non-tender; bowel sounds normal; no masses,  no organomegaly   Vulva:  normal  Vagina: normal vagina, no discharge, exudate, lesion, or erythema  Cervix:  multiparous appearance  Corpus: normal size, contour, position, consistency, mobility, non-tender  Adnexa:  no mass, fullness, tenderness  Rectal Exam: Not performed.        Assessment:     Normal postpartum exam. Pap smear not done at today's visit.   Plan:    1. Contraception: IUD 2. IUD Procedure Note Patient identified, informed consent performed, signed copy in chart, time out was performed.  Urine pregnancy test negative.  Speculum placed in the vagina.  Cervix visualized.  Cleaned with Betadine x 2.  Grasped anteriorly with a single tooth tenaculum.  Uterus sounded to 8 cm.  Paraguard IUD placed per  manufacturer's recommendations.  Strings trimmed to 3 cm. Tenaculum was removed, good hemostasis noted.  Patient tolerated procedure well.   Patient given post procedure instructions and Mirena care card with expiration date.  Patient is asked to check IUD strings periodically and follow up in 4-6 weeks for IUD check.   3. Follow up in: 4 weeks or as needed.  Normal BP today. Will recheck at next visit and may discontinue at that time if stable

## 2013-10-30 IMAGING — US US OB FOLLOW-UP
1 series · 12 of 28 positions shown · non-contrast
Comparison: none

[Series 1: us ob follow up · 12 of 36 slices shown]
[im 2/36]
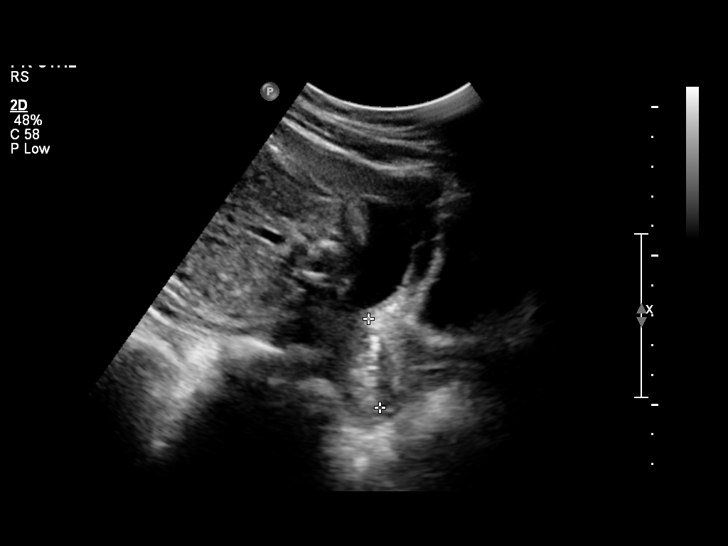
[im 4/36]
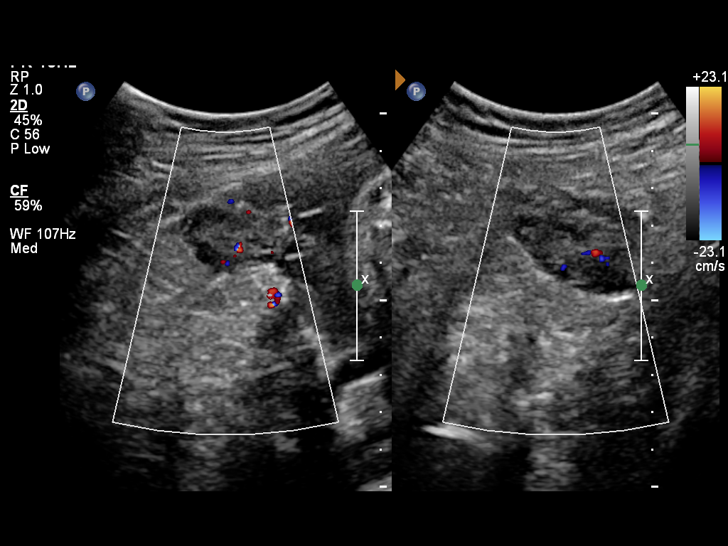
[im 7/36]
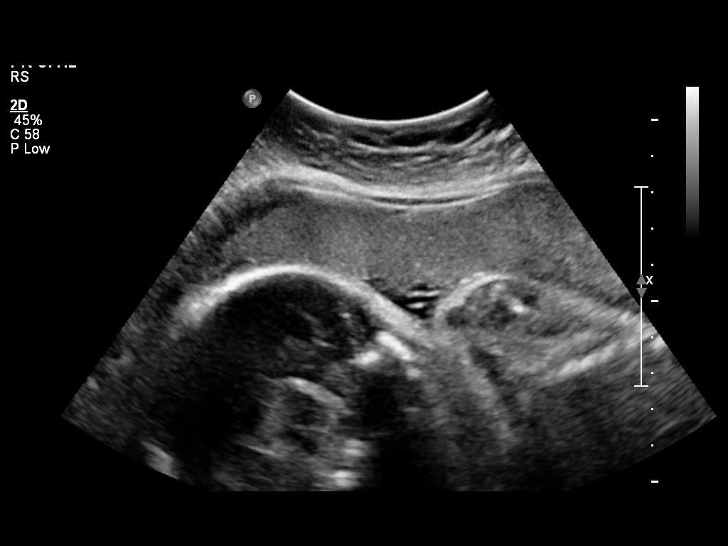
[im 11/36]
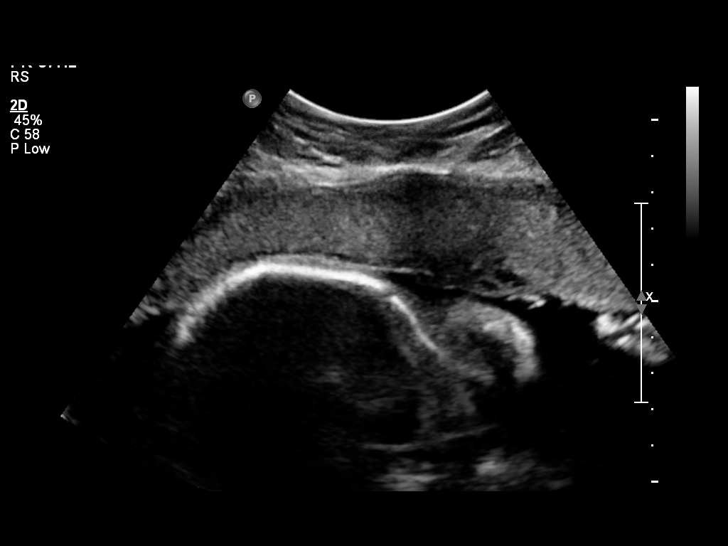
[im 13/36]
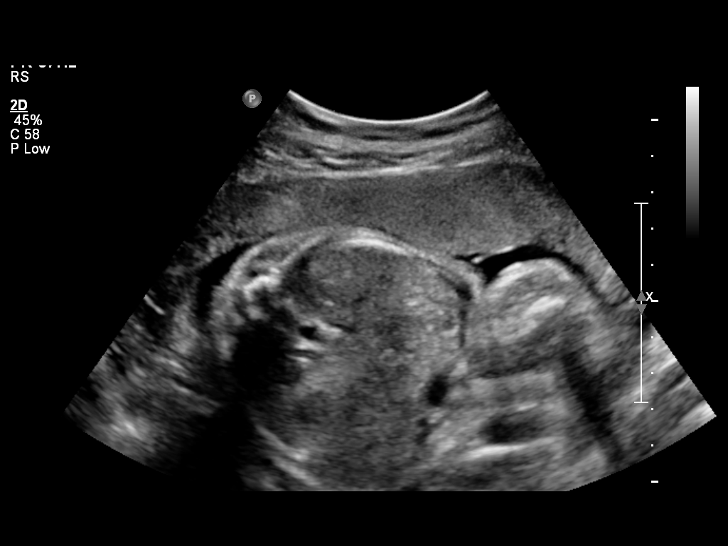
[im 16/36]
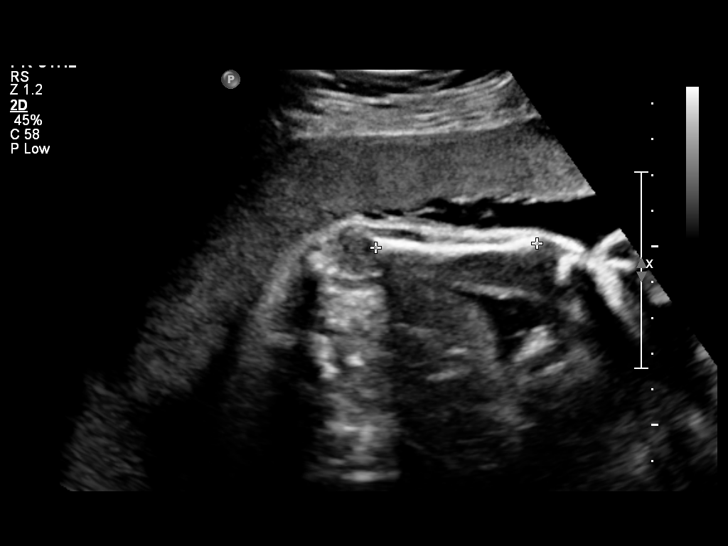
[im 20/36]
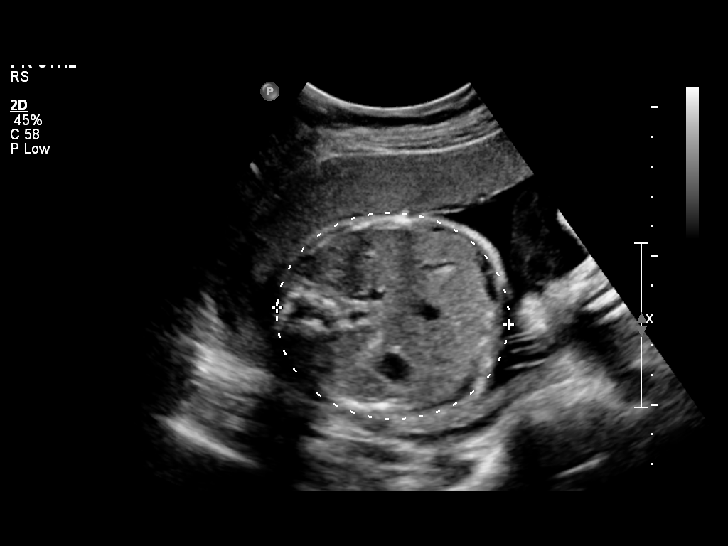
[im 23/36]
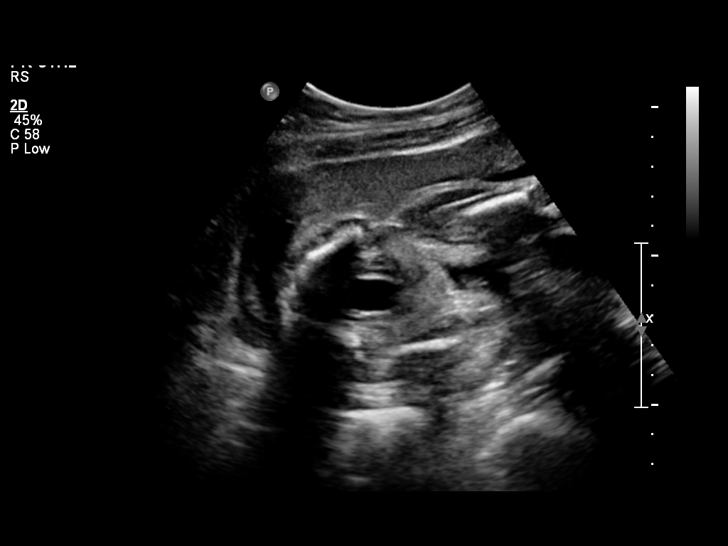
[im 25/36]
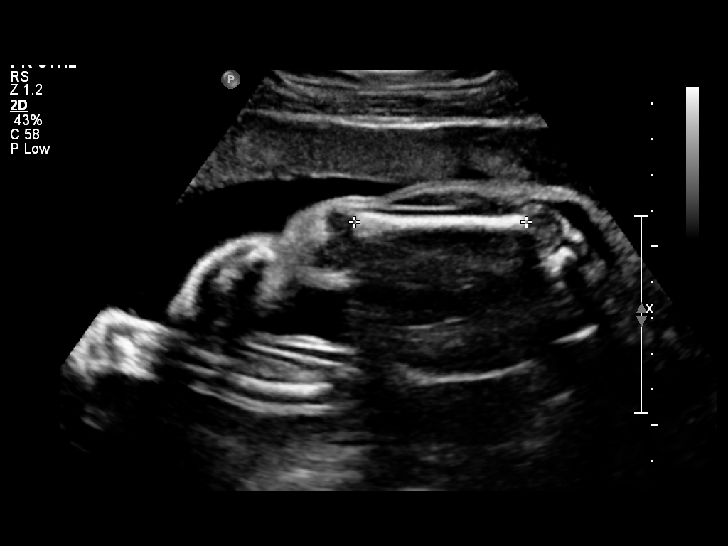
[im 29/36]
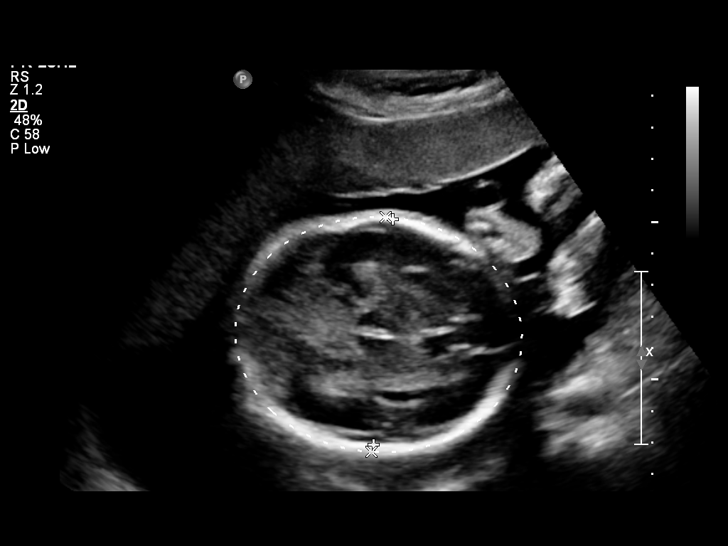
[im 32/36]
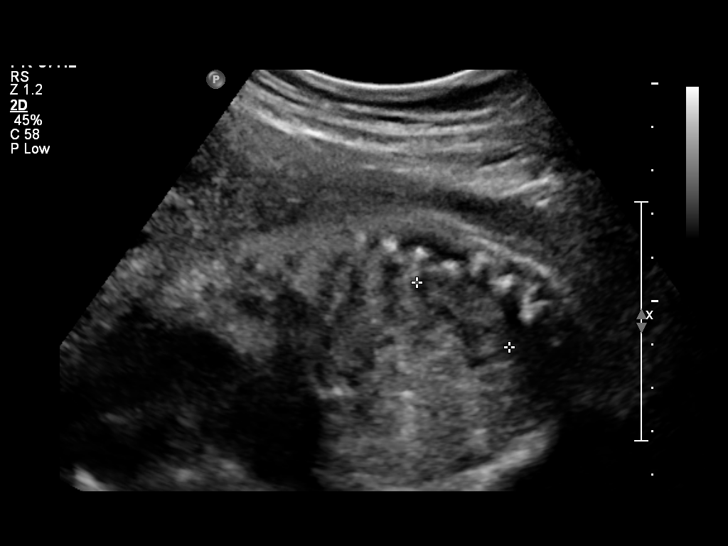
[im 34/36]
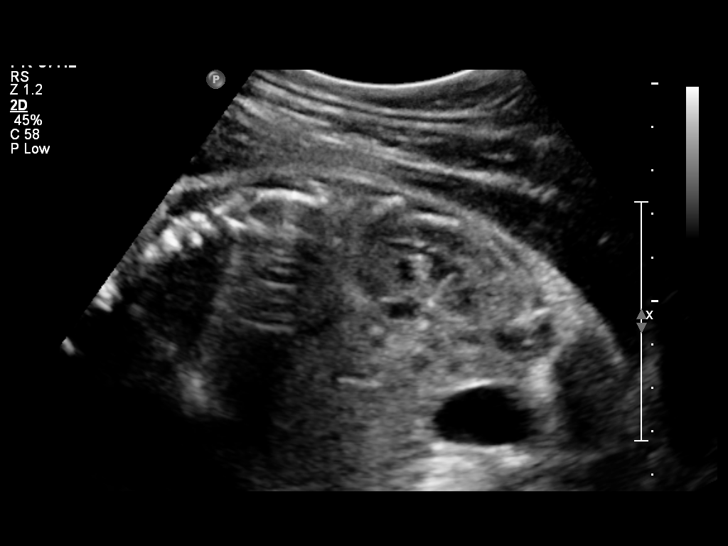

[12 of 28 positions shown; findings below may reference images not displayed]

OBSTETRICS REPORT
                      (Signed Final 11/16/2012 [DATE])

Service(s) Provided

 US OB FOLLOW UP                                       76816.1
Indications

 Size-Date Discrepancy
Fetal Evaluation

 Num Of Fetuses:    1
 Fetal Heart Rate:  133                         bpm
 Cardiac Activity:  Observed
 Presentation:      Breech
 Placenta:          Anterior, above cervical os
 P. Cord            Previously Visualized
 Insertion:

 Amniotic Fluid
 AFI FV:      Subjectively within normal limits
                                             Larg Pckt:     6.9  cm
Biometry

 BPD:     71.6  mm    G. Age:   28w 5d                CI:         75.1   70 - 86
                                                      FL/HC:      18.4   18.6 -

 HC:     262.1  mm    G. Age:   28w 4d       56  %    HC/AC:      1.13   1.05 -

 AC:     232.5  mm    G. Age:   27w 4d       47  %    FL/BPD:     67.5   71 - 87
 FL:      48.3  mm    G. Age:   26w 2d        9  %    FL/AC:      20.8   20 - 24
 HUM:     45.4  mm    G. Age:   26w 6d       34  %

 Est. FW:    2272  gm      2 lb 5 oz     49  %
Gestational Age

 LMP:           26w 1d       Date:   05/17/12                 EDD:   02/21/13
 U/S Today:     27w 5d                                        EDD:   02/10/13
 Best:          27w 3d    Det. By:   U/S    (10/13/12)        EDD:   02/12/13
Anatomy

 Cranium:          Appears normal         Aortic Arch:      Previously seen
 Fetal Cavum:      Previously seen        Ductal Arch:      Previously seen
 Ventricles:       Appears normal         Diaphragm:        Previously seen
 Choroid Plexus:   Previously seen        Stomach:          Appears normal, left
                                                            sided
 Cerebellum:       Previously seen        Abdomen:          Previously seen
 Posterior Fossa:  Previously seen        Abdominal Wall:   Previously seen
 Nuchal Fold:      Not applicable (>20    Cord Vessels:     Appears normal (3
                   wks GA)                                  vessel cord)
 Face:             Orbits and profile     Kidneys:          Appear normal
                   previously seen
 Lips:             Previously seen        Bladder:          Appears normal
 Heart:            Appears normal         Spine:            Previously seen
                   (4CH, axis, and
                   situs)
 RVOT:             Previously seen        Lower             Previously seen
                                          Extremities:
 LVOT:             Previously seen        Upper             Previously seen
                                          Extremities:

 Other:  Fetus appears to be a male. Heels and 5th digit visualized. Nasal
         bone visualized. Technically difficult due to fetal position.
Cervix Uterus Adnexa

 Cervical Length:   3         cm

 Cervix:       Normal appearance by transabdominal scan.
 Uterus:       No abnormality visualized.
 Left Ovary:   No adnexal mass visualized.
 Right Ovary:  No adnexal mass visualized.
 Adnexa:     No abnormality visualized.
Comments

 Patient states that her LMP is uncertain and 1st ultrasound is
 being used for dating.
Impression

 Single intrauterine gestation demonstrating an estimated
 gestational age by ultrasound of 27w 5d. This is correlated
 with expected estimated gestational age by initial ultrasound
 of 27w 3d. EFW is currently at the 49%.

 No late developing fetal anatomic abnormalities are noted
 associated with the lateral ventricles, four chamber heart,
 stomach, kidneys or bladder.

 Subjectively and quantitatively normal amniotic fluid volume.

 Normal cervical length and appearance.

## 2014-08-12 ENCOUNTER — Encounter: Payer: Self-pay | Admitting: Obstetrics and Gynecology

## 2015-03-06 ENCOUNTER — Ambulatory Visit: Payer: Self-pay | Admitting: Family Medicine

## 2015-03-26 ENCOUNTER — Encounter: Payer: Self-pay | Admitting: *Deleted

## 2015-09-08 ENCOUNTER — Ambulatory Visit (INDEPENDENT_AMBULATORY_CARE_PROVIDER_SITE_OTHER): Payer: Self-pay | Admitting: Advanced Practice Midwife

## 2015-09-08 ENCOUNTER — Encounter: Payer: Self-pay | Admitting: Advanced Practice Midwife

## 2015-09-08 VITALS — BP 127/76 | HR 78 | Temp 98.5°F | Ht 65.0 in | Wt 183.0 lb

## 2015-09-08 DIAGNOSIS — Z538 Procedure and treatment not carried out for other reasons: Secondary | ICD-10-CM

## 2015-09-08 DIAGNOSIS — Z975 Presence of (intrauterine) contraceptive device: Principal | ICD-10-CM

## 2015-09-08 DIAGNOSIS — Z3046 Encounter for surveillance of implantable subdermal contraceptive: Secondary | ICD-10-CM

## 2015-09-08 MED ORDER — OXYCODONE-ACETAMINOPHEN 5-325 MG PO TABS: ORAL_TABLET | ORAL | Status: DC

## 2015-09-08 MED ORDER — OXYCODONE-ACETAMINOPHEN 5-325 MG PO TABS: 1.0000 | ORAL_TABLET | ORAL | Status: DC | PRN

## 2015-09-08 NOTE — Progress Notes (Signed)
Subjective:     Patient ID: Jenny [name], female   DOB: 03-02-1986, 29 y.o.   MRN: [id number]  HPI Pt presents with desire to have Paraguard IUD removed today.  She noticed thin clear discharge, like saliva, and then wiped and the white strings of the Paraguard came out on the tissue.  She brings them to the office today in a bag.  She reports hx of yeast infections off and on since IUD placed.  She also reports concerns about the IUD because of research done on the internet.  She does not desire pregnancy right now but she and her husband would be OK if it occurred.  She does not use hormonal birth control for religious reasons.  She denies vaginal bleeding, vaginal itching/burning, urinary symptoms, h/a, dizziness, n/v, or fever/chills.    Review of Systems  Constitutional: Negative for fever, chills and fatigue.  HENT: Negative for sinus pressure.   Eyes: Negative for photophobia.  Respiratory: Negative for shortness of breath.   Cardiovascular: Negative for chest pain.  Gastrointestinal: Negative for nausea, vomiting, diarrhea and constipation.  Genitourinary: Negative for dysuria, frequency, flank pain, vaginal bleeding, vaginal discharge, difficulty urinating, vaginal pain and pelvic pain.  Musculoskeletal: Negative for neck pain.  Neurological: Negative for dizziness, weakness and headaches.  Psychiatric/Behavioral: Negative.        Objective:   Physical Exam VS reviewed, nursing note reviewed,  Constitutional: well developed, well nourished, no distress HEENT: normocephalic CV: normal rate Pulm/chest wall: normal effort Abdomen: soft Neuro: alert and oriented x 3 Skin: warm, dry Psych: affect normal Pelvic exam: Cervix pink, visually closed, without lesion, IUD strings not visible at os, scant white creamy discharge, vaginal walls and external genitalia normal Bimanual exam: Cervix 0/long/high, firm, anterior, neg CMT, uterus nontender, nonenlarged, adnexa without  tenderness, enlargement, or mass  IUD Removal  Patient was in the dorsal lithotomy position, normal external genitalia was noted.  A speculum was placed in the patient's vagina, normal discharge was noted, no lesions. The multiparous cervix was visualized, no lesions, no abnormal discharge.  The strings of the IUD were not visible.  A Kelly clamp was used to explore the os but unable to grasp the IUD.  Dr Jolayne Pantheronstant called to room for assistance and Kelly clamp and hook used to try to remove IUD but attempts unsuccessful. Patient tolerated the procedure well but declined further exploration in office today.       Assessment:     1. Attempted IUD removal, unsuccessful  - US Pelvis Limited; Future  --Percocet 5/325, take 2 tablets 30 minutes prior to clinic visit, x 2 tabs with no refills --US scheduled and follow up appt in clinic immediately after ultrasound for another attempt to removed device without going to OR.  Pt aware that if unsuccessful, IUD will have to be removed in OR under anesthesia.    Plan:     D/C home See above plan for US and second attempt at removal of IUD Pt plans to use condoms after IUD removal, counseled regarding effectiveness, alternative methods of contraception.  Encouraged pt to take prenatal vitamin or multivitamin and at least 800 mcg folic acid daily.

## 2015-09-10 ENCOUNTER — Other Ambulatory Visit: Payer: Self-pay | Admitting: Advanced Practice Midwife

## 2015-09-10 ENCOUNTER — Ambulatory Visit (HOSPITAL_COMMUNITY)
Admission: RE | Admit: 2015-09-10 | Discharge: 2015-09-10 | Disposition: A | Discharge status: Home / Self Care | Payer: Medicaid Other | Source: Ambulatory Visit | Attending: Advanced Practice Midwife | Admitting: Advanced Practice Midwife

## 2015-09-10 DIAGNOSIS — Z538 Procedure and treatment not carried out for other reasons: Secondary | ICD-10-CM

## 2015-09-10 DIAGNOSIS — T8332XA Displacement of intrauterine contraceptive device, initial encounter: Secondary | ICD-10-CM | POA: Diagnosis not present

## 2015-09-10 DIAGNOSIS — Z975 Presence of (intrauterine) contraceptive device: Secondary | ICD-10-CM

## 2015-09-11 ENCOUNTER — Telehealth: Payer: Self-pay | Admitting: General Practice

## 2015-09-11 NOTE — Telephone Encounter (Signed)
Patient called and left message requesting results from ultrasound. Called patient, no answer- left message stating we are trying to reach you to return your phone call, please call us back at the clinics

## 2015-09-11 NOTE — Telephone Encounter (Signed)
Received message left on nurse line on 09/11/15 at 1458.  Patient states she missed our call earlier and would like a return call.

## 2015-09-12 ENCOUNTER — Telehealth: Payer: Self-pay | Admitting: General Practice

## 2015-09-12 DIAGNOSIS — Z30432 Encounter for removal of intrauterine contraceptive device: Secondary | ICD-10-CM

## 2015-09-12 MED ORDER — MISOPROSTOL 200 MCG PO TABS: 200.0000 ug | ORAL_TABLET | Freq: Once | ORAL | Status: DC

## 2015-09-12 NOTE — Telephone Encounter (Signed)
Per Misty StanleyLisa, patient needs cytotec 200mg  by mouth & per vagina 30 minutes prior to appt. Called patient and informed her of medication & directions. Patient verbalized understanding & had no questions

## 2015-09-12 NOTE — Telephone Encounter (Signed)
Patient called and left message stating she is calling for her results. Called patient and informed her that the IUD is in place. Patient verbalized understanding and asked about any cysts on her ovaries. Told patient her ovaries looked fine. Patient verbalized understanding and had no other questions

## 2015-09-17 ENCOUNTER — Ambulatory Visit: Payer: Self-pay | Admitting: Advanced Practice Midwife

## 2015-09-17 ENCOUNTER — Telehealth: Payer: Self-pay | Admitting: General Practice

## 2015-09-17 ENCOUNTER — Encounter: Payer: Self-pay | Admitting: Obstetrics & Gynecology

## 2015-09-17 ENCOUNTER — Ambulatory Visit (INDEPENDENT_AMBULATORY_CARE_PROVIDER_SITE_OTHER): Payer: Medicaid Other | Admitting: Obstetrics & Gynecology

## 2015-09-17 VITALS — BP 113/82 | HR 66 | Temp 98.6°F | Wt 182.0 lb

## 2015-09-17 DIAGNOSIS — Z30432 Encounter for removal of intrauterine contraceptive device: Secondary | ICD-10-CM

## 2015-09-17 MED ORDER — MISOPROSTOL 200 MCG PO TABS: 200.0000 ug | ORAL_TABLET | Freq: Once | ORAL | Status: DC

## 2015-09-17 NOTE — Progress Notes (Signed)
Patient ID: Jenny [name], female   DOB: October 26, 1985, 29 y.o.   MRN: [id number] Cc: wants IUD removed, unsuccessful at last attempt  29 y.o. G2P1011 Patient's last menstrual period was 07/28/2015. Patient has Paragard, has had problems and will use condoms instead.She took cytotec today  Past Medical History  Diagnosis Date  . Shortness of breath     when running  . Urinary tract infection    Past Surgical History  Procedure Laterality Date  . Lip repair     Allergies  Allergen Reactions  . 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 Uterine dressing forceps used to remove IUD intact without difficulty. She tolerated well.   Adam PhenixJames G Kenzee Bassin, MD 09/17/2015 4:06 PM

## 2015-09-17 NOTE — Telephone Encounter (Signed)
Patient called and left message stating she was supposed to have a Rx go to her pharmacy and she went by and it was not there at her [facility] pharmacy. Per chart review, medication went to [facility]. Sent medication to [facility]. Called patient, no answer- left message stating we have received your message and sent the medication to your [facility] pharmacy.

## 2015-09-17 NOTE — Patient Instructions (Signed)
Contraceptive Barrier Methods A barrier method is a type of birth control (contraception) that is used to prevent pregnancy. These methods include:   Female condom.   Female condom.   Diaphragm.   Cervical cap.   Sponge.   Spermicide.  Your health care provider can help you decide what form of contraception is best for you. Always keep in mind the risks of sexually transmitted infections (STIs).  FEMALE CONDOM A female condom is a thin sheath (latex or rubber) that is worn over the penis during sexual intercourse. The condom prevents pregnancy by catching and stopping the sperm from reaching the uterus. Condoms may come with a spermicide on them, and they can only be worn once. Condoms should not be used with petroleum jelly, lotions, or oils. These things decrease their effectiveness. Condoms can be used with water-based lubricants. Condoms help protect against STIs. Latex and polyurethane condoms provide the best available protection against many STIs, including HIV.  FEMALE CONDOM The female condom is a soft, loose-fitting sheath that is put into the vagina before sexual intercourse. It prevents pregnancy by catching the sperm in the condom and blocking the passage of sperm to the uterus. It is intended for one-time use only. A female partner should not use a condom at the same time. The female and female condoms may stick together and break. A female condom can be inserted as long as 8 hours before intercourse. Condoms help protect against STIs.  DIAPHRAGM A diaphragm is a soft, latex, dome-shaped barrier that is placed in the vagina with spermicidal jelly before sexual intercourse. It covers the cervix, kills sperm, and blocks the passage of semen into the cervix. The diaphragm can be inserted up to 2 hours before sex. If it is inserted more than 2 hours before intercourse, then spermicide must be applied again. The diaphragm should be left in the vagina for 6-8 hours after intercourse. It must  be fitted by a health care provider. This method does not protect against STIs.  CERVICAL CAP A cervical cap is a round, soft, latex or plastic cup that is put in the vagina and fits over the cervix. It may be inserted as long as 6 hours before sexual activity. It must be left in place for at least 6 hours after intercourse and can be left in place for as long as 48 hours. It provides continuous protection as long as it is in place, regardless of the number of intercourse acts. The cervical cap cannot be used during your period. It must be fitted by a health care provider. Cervical caps do not protect against STIs. SPONGE A sponge is a soft, circular piece of polyurethane foam that has spermicide in it. The sponge has a loop for removal. It is inserted into the vagina after wetting it and is placed over the cervix before sexual intercourse. The foam is designed to trap and absorb sperm before it enters the cervix. The spermicide kills or immobilizes sperm. The sponge offers an immediate and continuous presence of spermicide throughout a 24-hour period regardless of the number of intercourse acts. The sponge should be left in place for at least 6 hours after sex. It should not be left in for more than 24 hours, and it cannot be reused. The sponge does not protect against STIs. SPERMICIDES Spermicides are chemicals that kill or block sperm from entering the cervix and uterus. They come in the form of creams, jellies, suppositories, foam, film, or tablets. The film, tablets, and   suppositories should be inserted 10 to 30 minutes before sexual intercourse so they can dissolve. They are inserted into the vagina with an applicator before having sexual intercourse. This must be repeated every time you have sexual intercourse. The use of spermicides does not protect against STIs.   This information is not intended to replace advice given to you by your health care provider. Make sure you discuss any questions you  have with your health care provider.   Document Released: 07/25/2007 Document Revised: 05/30/2013 Document Reviewed: 03/11/2013 Elsevier Interactive Patient Education [employer]2016 [employer] Inc.

## 2015-10-12 NOTE — L&D Delivery Note (Signed)
Delivery Note  First stage of labor: SROM in latent labor at 1200 on 07/28/16, [facility] hrs post [facility] then transfer to hospital for augmentation of labor. Misoprostil PO for cervical ripening x 1 then Pitocin augmentation started in AM. Epidural for pain management at noon.   Complete dilation at 1620 Onset of pushing at 1620 FHR second stage category 1  Analgesia /Anesthesia intrapartum: epidural  Delivery of a viable baby boy at 1655 by CNM in OA position.  Nuchal Cord none. Cord double clamped after cessation of pulsation, cut by FOB.  Cord blood sample collected. Collection of cord blood donation completed.  Placenta delivered spontaneous complete and intact with 3 VC.  Placenta to unit for disposal. Uterine tone firm, bleeding small  no laceration identified.   Est. Blood Loss (mL): 150  Complications: GBS positive, adequate prophylaxis APGAR: 9/9 Mom to postpartum.  Baby to Couplet care / Skin to Skin.  Neta Mendsaniela C Chakia Counts, CNM, MSN 07/29/2016, 5:37 PM

## 2016-04-24 ENCOUNTER — Other Ambulatory Visit: Payer: Self-pay

## 2016-04-24 ENCOUNTER — Inpatient Hospital Stay (HOSPITAL_COMMUNITY)
Admission: AD | Admit: 2016-04-24 | Discharge: 2016-04-24 | Disposition: A | Discharge status: Home / Self Care | Payer: Medicaid Other | Source: Ambulatory Visit | Attending: Obstetrics and Gynecology | Admitting: Obstetrics and Gynecology

## 2016-04-24 DIAGNOSIS — Z6791 Unspecified blood type, Rh negative: Secondary | ICD-10-CM | POA: Diagnosis not present

## 2016-04-24 DIAGNOSIS — O26892 Other specified pregnancy related conditions, second trimester: Secondary | ICD-10-CM | POA: Insufficient documentation

## 2016-04-24 DIAGNOSIS — O26893 Other specified pregnancy related conditions, third trimester: Secondary | ICD-10-CM | POA: Diagnosis not present

## 2016-04-24 DIAGNOSIS — O360130 Maternal care for anti-D [Rh] antibodies, third trimester, not applicable or unspecified: Secondary | ICD-10-CM

## 2016-04-24 DIAGNOSIS — Z3A00 Weeks of gestation of pregnancy not specified: Secondary | ICD-10-CM | POA: Insufficient documentation

## 2016-04-24 MED ORDER — RHO D IMMUNE GLOBULIN 1500 UNIT/2ML IJ SOSY
300.0000 ug | PREFILLED_SYRINGE | Freq: Once | INTRAMUSCULAR | Status: AC
  Administered 2016-04-24: 300 ug via INTRAMUSCULAR
  Filled 2016-04-24: qty 2

## 2016-04-24 MED ORDER — RHO D IMMUNE GLOBULIN 1500 UNIT/2ML IJ SOSY
300.0000 ug | PREFILLED_SYRINGE | Freq: Once | INTRAMUSCULAR | Status: DC
Start: 2016-04-24 — End: 2016-07-30

## 2016-04-24 NOTE — Progress Notes (Signed)
Pt sent to MAU for Rhogam injection.  Denies LOF/VB/contractions.

## 2016-04-25 LAB — RH IG WORKUP (INCLUDES ABO/RH)
ABO/RH(D): B NEG
ANTIBODY SCREEN: NEGATIVE
Gestational Age(Wks): 28
UNIT DIVISION: 0

## 2016-07-28 ENCOUNTER — Other Ambulatory Visit: Payer: Self-pay | Admitting: Certified Nurse Midwife

## 2016-07-29 ENCOUNTER — Inpatient Hospital Stay (HOSPITAL_COMMUNITY)
Admission: AD | Admit: 2016-07-29 | Discharge: 2016-07-30 | DRG: 775 | Disposition: A | Discharge status: Home / Self Care | Payer: Medicaid Other | Source: Ambulatory Visit | Attending: Obstetrics and Gynecology | Admitting: Obstetrics and Gynecology

## 2016-07-29 ENCOUNTER — Inpatient Hospital Stay (HOSPITAL_COMMUNITY): Payer: Medicaid Other | Admitting: Anesthesiology

## 2016-07-29 ENCOUNTER — Encounter (HOSPITAL_COMMUNITY): Payer: Self-pay

## 2016-07-29 DIAGNOSIS — O4202 Full-term premature rupture of membranes, onset of labor within 24 hours of rupture: Secondary | ICD-10-CM | POA: Diagnosis present

## 2016-07-29 DIAGNOSIS — Z823 Family history of stroke: Secondary | ICD-10-CM

## 2016-07-29 DIAGNOSIS — O26892 Other specified pregnancy related conditions, second trimester: Secondary | ICD-10-CM

## 2016-07-29 DIAGNOSIS — O99824 Streptococcus B carrier state complicating childbirth: Secondary | ICD-10-CM | POA: Diagnosis present

## 2016-07-29 DIAGNOSIS — Z6791 Unspecified blood type, Rh negative: Secondary | ICD-10-CM

## 2016-07-29 DIAGNOSIS — Z833 Family history of diabetes mellitus: Secondary | ICD-10-CM | POA: Diagnosis not present

## 2016-07-29 DIAGNOSIS — Z3A41 41 weeks gestation of pregnancy: Secondary | ICD-10-CM

## 2016-07-29 DIAGNOSIS — O4290 Premature rupture of membranes, unspecified as to length of time between rupture and onset of labor, unspecified weeks of gestation: Secondary | ICD-10-CM | POA: Diagnosis present

## 2016-07-29 DIAGNOSIS — O4292 Full-term premature rupture of membranes, unspecified as to length of time between rupture and onset of labor: Secondary | ICD-10-CM | POA: Diagnosis present

## 2016-07-29 HISTORY — DX: Unspecified abnormal cytological findings in specimens from vagina: R87.629

## 2016-07-29 LAB — RPR: RPR Ser Ql: NONREACTIVE

## 2016-07-29 LAB — CBC
HCT: 34.3 % — ABNORMAL LOW (ref 36.0–46.0)
Hemoglobin: 11.2 g/dL — ABNORMAL LOW (ref 12.0–15.0)
MCH: 30.3 pg (ref 26.0–34.0)
MCHC: 32.7 g/dL (ref 30.0–36.0)
MCV: 92.7 fL (ref 78.0–100.0)
Platelets: 189 10*3/uL (ref 150–400)
RBC: 3.7 MIL/uL — ABNORMAL LOW (ref 3.87–5.11)
RDW: 15.3 % (ref 11.5–15.5)
WBC: 11.6 10*3/uL — ABNORMAL HIGH (ref 4.0–10.5)

## 2016-07-29 LAB — TYPE AND SCREEN
ABO/RH(D): B NEG
Antibody Screen: NEGATIVE

## 2016-07-29 MED ORDER — ONDANSETRON HCL 4 MG/2ML IJ SOLN: 4.0000 mg | INTRAMUSCULAR | Status: DC | PRN

## 2016-07-29 MED ORDER — LIDOCAINE HCL (PF) 1 % IJ SOLN
30.0000 mL | INTRAMUSCULAR | Status: DC | PRN
  Filled 2016-07-29: qty 30

## 2016-07-29 MED ORDER — DEXTROSE 5 % IV SOLN
2.5000 10*6.[IU] | INTRAVENOUS | Status: DC
  Administered 2016-07-29 (×4): 2.5 10*6.[IU] via INTRAVENOUS
  Filled 2016-07-29 (×6): qty 2.5

## 2016-07-29 MED ORDER — SOD CITRATE-CITRIC ACID 500-334 MG/5ML PO SOLN: 30.0000 mL | ORAL | Status: DC | PRN

## 2016-07-29 MED ORDER — PRENATAL MULTIVITAMIN CH
1.0000 | ORAL_TABLET | Freq: Every day | ORAL | Status: DC
  Administered 2016-07-30: 1 via ORAL
  Filled 2016-07-29: qty 1

## 2016-07-29 MED ORDER — ACETAMINOPHEN 325 MG PO TABS: 650.0000 mg | ORAL_TABLET | ORAL | Status: DC | PRN

## 2016-07-29 MED ORDER — DIPHENHYDRAMINE HCL 50 MG/ML IJ SOLN: 12.5000 mg | INTRAMUSCULAR | Status: DC | PRN

## 2016-07-29 MED ORDER — DIPHENHYDRAMINE HCL 25 MG PO CAPS: 25.0000 mg | ORAL_CAPSULE | Freq: Four times a day (QID) | ORAL | Status: DC | PRN

## 2016-07-29 MED ORDER — FENTANYL 2.5 MCG/ML BUPIVACAINE 1/10 % EPIDURAL INFUSION (WH - ANES): 14.0000 mL/h | INTRAMUSCULAR | Status: DC | PRN

## 2016-07-29 MED ORDER — EPHEDRINE 5 MG/ML INJ
10.0000 mg | INTRAVENOUS | Status: DC | PRN
  Filled 2016-07-29: qty 4

## 2016-07-29 MED ORDER — SENNOSIDES-DOCUSATE SODIUM 8.6-50 MG PO TABS
2.0000 | ORAL_TABLET | ORAL | Status: DC
Start: 2016-07-30 — End: 2016-07-30
  Filled 2016-07-29: qty 2

## 2016-07-29 MED ORDER — ONDANSETRON HCL 4 MG PO TABS: 4.0000 mg | ORAL_TABLET | ORAL | Status: DC | PRN

## 2016-07-29 MED ORDER — PHENYLEPHRINE 40 MCG/ML (10ML) SYRINGE FOR IV PUSH (FOR BLOOD PRESSURE SUPPORT)
80.0000 ug | PREFILLED_SYRINGE | INTRAVENOUS | Status: DC | PRN
  Filled 2016-07-29: qty 5

## 2016-07-29 MED ORDER — LIDOCAINE HCL (PF) 1 % IJ SOLN
INTRAMUSCULAR | Status: DC | PRN
  Administered 2016-07-29 (×2): 5 mL

## 2016-07-29 MED ORDER — FENTANYL 2.5 MCG/ML BUPIVACAINE 1/10 % EPIDURAL INFUSION (WH - ANES)
14.0000 mL/h | INTRAMUSCULAR | Status: DC | PRN
  Administered 2016-07-29 (×2): 14 mL/h via EPIDURAL
  Filled 2016-07-29: qty 125

## 2016-07-29 MED ORDER — PHENYLEPHRINE 40 MCG/ML (10ML) SYRINGE FOR IV PUSH (FOR BLOOD PRESSURE SUPPORT)
80.0000 ug | PREFILLED_SYRINGE | INTRAVENOUS | Status: DC | PRN
  Filled 2016-07-29: qty 5
  Filled 2016-07-29: qty 10

## 2016-07-29 MED ORDER — MISOPROSTOL 200 MCG PO TABS
400.0000 ug | ORAL_TABLET | Freq: Once | ORAL | Status: AC
  Administered 2016-07-29: 400 ug via RECTAL

## 2016-07-29 MED ORDER — MISOPROSTOL 50MCG HALF TABLET
50.0000 ug | ORAL_TABLET | ORAL | Status: DC
  Administered 2016-07-29 (×2): 50 ug via ORAL
  Filled 2016-07-29 (×2): qty 0.5

## 2016-07-29 MED ORDER — LACTATED RINGERS IV SOLN: 500.0000 mL | Freq: Once | INTRAVENOUS | Status: DC

## 2016-07-29 MED ORDER — TERBUTALINE SULFATE 1 MG/ML IJ SOLN
0.2500 mg | Freq: Once | INTRAMUSCULAR | Status: DC | PRN
  Filled 2016-07-29: qty 1

## 2016-07-29 MED ORDER — TETANUS-DIPHTH-ACELL PERTUSSIS 5-2.5-18.5 LF-MCG/0.5 IM SUSP: 0.5000 mL | Freq: Once | INTRAMUSCULAR | Status: DC

## 2016-07-29 MED ORDER — BISACODYL 10 MG RE SUPP: 10.0000 mg | Freq: Every day | RECTAL | Status: DC | PRN

## 2016-07-29 MED ORDER — OXYTOCIN 40 UNITS IN LACTATED RINGERS INFUSION - SIMPLE MED: 2.5000 [IU]/h | INTRAVENOUS | Status: DC

## 2016-07-29 MED ORDER — FLEET ENEMA 7-19 GM/118ML RE ENEM: 1.0000 | ENEMA | Freq: Every day | RECTAL | Status: DC | PRN

## 2016-07-29 MED ORDER — ZOLPIDEM TARTRATE 5 MG PO TABS: 5.0000 mg | ORAL_TABLET | Freq: Every evening | ORAL | Status: DC | PRN

## 2016-07-29 MED ORDER — SIMETHICONE 80 MG PO CHEW: 80.0000 mg | CHEWABLE_TABLET | ORAL | Status: DC | PRN

## 2016-07-29 MED ORDER — BENZOCAINE-MENTHOL 20-0.5 % EX AERO: 1.0000 "application " | INHALATION_SPRAY | CUTANEOUS | Status: DC | PRN

## 2016-07-29 MED ORDER — COCONUT OIL OIL: 1.0000 "application " | TOPICAL_OIL | Status: DC | PRN

## 2016-07-29 MED ORDER — IBUPROFEN 600 MG PO TABS
600.0000 mg | ORAL_TABLET | Freq: Four times a day (QID) | ORAL | Status: DC
  Administered 2016-07-29 – 2016-07-30 (×5): 600 mg via ORAL
  Filled 2016-07-29 (×5): qty 1

## 2016-07-29 MED ORDER — LACTATED RINGERS IV SOLN: 500.0000 mL | INTRAVENOUS | Status: DC | PRN

## 2016-07-29 MED ORDER — OXYCODONE-ACETAMINOPHEN 5-325 MG PO TABS: 2.0000 | ORAL_TABLET | ORAL | Status: DC | PRN

## 2016-07-29 MED ORDER — OXYTOCIN 40 UNITS IN LACTATED RINGERS INFUSION - SIMPLE MED
1.0000 m[IU]/min | INTRAVENOUS | Status: DC
  Administered 2016-07-29: 6 m[IU]/min via INTRAVENOUS
  Administered 2016-07-29: 2 m[IU]/min via INTRAVENOUS
  Filled 2016-07-29: qty 1000

## 2016-07-29 MED ORDER — OXYCODONE-ACETAMINOPHEN 5-325 MG PO TABS
1.0000 | ORAL_TABLET | ORAL | Status: DC | PRN
  Administered 2016-07-29: 1 via ORAL
  Filled 2016-07-29: qty 1

## 2016-07-29 MED ORDER — RHO D IMMUNE GLOBULIN 1500 UNIT/2ML IJ SOSY
300.0000 ug | PREFILLED_SYRINGE | INTRAMUSCULAR | Status: DC | PRN
  Filled 2016-07-29: qty 2

## 2016-07-29 MED ORDER — MISOPROSTOL 200 MCG PO TABS
ORAL_TABLET | ORAL | Status: AC
  Administered 2016-07-29: 400 ug via RECTAL
  Filled 2016-07-29: qty 2

## 2016-07-29 MED ORDER — OXYTOCIN BOLUS FROM INFUSION
500.0000 mL | Freq: Once | INTRAVENOUS | Status: AC
  Administered 2016-07-29: 500 mL via INTRAVENOUS

## 2016-07-29 MED ORDER — WITCH HAZEL-GLYCERIN EX PADS: 1.0000 "application " | MEDICATED_PAD | CUTANEOUS | Status: DC | PRN

## 2016-07-29 MED ORDER — PENICILLIN G POTASSIUM 5000000 UNITS IJ SOLR
5.0000 10*6.[IU] | Freq: Once | INTRAVENOUS | Status: AC
  Administered 2016-07-29: 5 10*6.[IU] via INTRAVENOUS
  Filled 2016-07-29: qty 5

## 2016-07-29 MED ORDER — TERBUTALINE SULFATE 1 MG/ML IJ SOLN: 0.2500 mg | Freq: Once | INTRAMUSCULAR | Status: DC | PRN

## 2016-07-29 MED ORDER — DIBUCAINE 1 % RE OINT: 1.0000 "application " | TOPICAL_OINTMENT | RECTAL | Status: DC | PRN

## 2016-07-29 MED ORDER — LACTATED RINGERS IV SOLN
INTRAVENOUS | Status: DC
  Administered 2016-07-29: 125 mL/h via INTRAVENOUS
  Administered 2016-07-29: 500 mL/h via INTRAVENOUS
  Administered 2016-07-29: 02:00:00 via INTRAVENOUS

## 2016-07-29 NOTE — Anesthesia Procedure Notes (Signed)
Epidural Patient location during procedure: OB Start time: 07/29/2016 12:30 PM End time: 07/29/2016 12:37 PM  Staffing Anesthesiologist: Bonita QuinGUIDETTI, Jenny Lorimer S Performed: anesthesiologist   Preanesthetic Checklist Completed: patient identified, site marked, surgical consent, pre-op evaluation, timeout performed, IV checked, risks and benefits discussed and monitors and equipment checked  Epidural Patient position: sitting Prep: site prepped and draped and DuraPrep Patient monitoring: continuous pulse ox and blood pressure Approach: midline Location: L4-L5 Injection technique: LOR saline  Needle:  Needle type: Tuohy  Needle gauge: 17 G Needle length: 9 cm and 9 Needle insertion depth: 5 cm cm Catheter type: closed end flexible Catheter size: 19 Gauge Catheter at skin depth: 10 cm Test dose: negative  Assessment Events: blood not aspirated, injection not painful, no injection resistance, negative IV test and no paresthesia

## 2016-07-29 NOTE — Anesthesia Preprocedure Evaluation (Signed)
Anesthesia Evaluation  Patient identified by MRN, date of birth, ID band Patient awake    Reviewed: Allergy & Precautions, H&P , NPO status , Patient's Chart, lab work & pertinent test results, reviewed documented beta blocker date and time   History of Anesthesia Complications Negative for: history of anesthetic complications  Airway Mallampati: III  TM Distance: >3 FB Neck ROM: full    Dental  (+) Teeth Intact   Pulmonary neg pulmonary ROS,    breath sounds clear to auscultation       Cardiovascular Hypertension: GHTN.  Rhythm:regular Rate:Normal     Neuro/Psych negative neurological ROS  negative psych ROS   GI/Hepatic negative GI ROS, Neg liver ROS,   Endo/Other  Obese BMI 35.4  Renal/GU negative Renal ROS  negative genitourinary   Musculoskeletal   Abdominal   Peds  Hematology negative hematology ROS (+)   Anesthesia Other Findings   Reproductive/Obstetrics (+) Pregnancy                             Anesthesia Physical  Anesthesia Plan  ASA: II  Anesthesia Plan: Epidural   Post-op Pain Management:    Induction:   Airway Management Planned:   Additional Equipment:   Intra-op Plan:   Post-operative Plan:   Informed Consent: I have reviewed the patients History and Physical, chart, labs and discussed the procedure including the risks, benefits and alternatives for the proposed anesthesia with the patient or authorized representative who has indicated his/her understanding and acceptance.     Plan Discussed with:   Anesthesia Plan Comments:         Anesthesia Quick Evaluation

## 2016-07-29 NOTE — H&P (Signed)
  OB ADMISSION/ HISTORY & PHYSICAL:  Admission Date: 07/29/2016 12:01 AM  Admit Diagnosis: PROM  Jenny [name] is a 30 y.o. female presenting for labor augmentation after PROM at term.  Prenatal History: G3P1011   EDC : 07/17/2016, by Last Menstrual Period  [facility]  [facility] Prenatal course complicated by hx PEC / + GBS  Prenatal Labs: ABO, Rh: --/--/B NEG (10/19 0130) - Rhogam given antepartum Antibody: NEG (10/19 0130) Rubella:   Immune RPR:   NR HBsAg:   Negative HIV:   NR GTT: NL GBS:   POSITIVE  Medical / Surgical History :  Past medical history:  Past Medical History:  Diagnosis Date  . Shortness of breath    when running  . Urinary tract infection   . Vaginal Pap smear, abnormal     Past surgical history:  Past Surgical History:  Procedure Laterality Date  . LIP REPAIR     Family History:  Family History  Problem Relation Age of Onset  . Diabetes Paternal Grandfather   . Stroke Paternal Grandmother   . Diabetes Paternal Grandmother   . Stroke Maternal Grandmother   . Stroke Maternal Grandfather     Social History:  reports that she has never smoked. She has never used smokeless tobacco. She reports that she does not drink alcohol or use drugs.  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 rate 16, height 5\' 5"  (1.651 m), weight 99.8 kg (220 lb), last menstrual period 10/11/2015.  General: alert and oriented, appears comfortable Heart: RRR Lungs: Clear lung fields Abdomen: Gravid, soft and non-tender, non-distended / uterus: Gravid Extremities: 1+ pedal edema  Genitalia / VE:    FHR: baseline rate 125 / variability moderate / accelerations + / no decelerations TOCO: rare ctx  Assessment: [redacted] weeks  gestation prodromal stage of labor FHR category 1   Plan:  cytotec oral x 1-3 doses Continue PCN  Dr Billy Coasttaavon notified of admission / plan of care   Marlinda MikeBAILEY, Jenny [name] CNM, MSN, [facility] 07/29/2016, 0100 AM

## 2016-07-29 NOTE — Anesthesia Pain Management Evaluation Note (Signed)
  CRNA Pain Management Visit Note  Patient: Jenny [name], 30 y.o., female  "Hello [facility] am a [facility]. We have an anesthesia team available at all times to provide care throughout the hospital, including epidural management and anesthesia for C-section. I don't know your plan for the delivery whether it a natural birth, water birth, IV sedation, nitrous supplementation, doula or epidural, but we want to meet your pain goals."   1.Was your pain managed to your expectations on prior hospitalizations?   Unable to assess - patient sleeping  2.What is your expectation for pain management during this hospitalization?     Unable to assess - patient sleeping  3.How can we help you reach that goal?   Record the patient's initial score and the patient's pain goal.   Pain: Patient sleeping - unable to assess  Pain Goal: Patient sleeping - unable to assess [facility] wants you to be able to say your pain was always managed very well.  [facility],Danielly Ackerley 07/29/2016

## 2016-07-29 NOTE — Lactation Note (Signed)
This note was copied from a baby's chart. Lactation Consultation Note  Patient Name: Jenny [name] ZOXWR'UToday's Date: 07/29/2016 Reason for consult: Initial assessment   Initial assessment with Exp BF mom [facility] < [facility]. Infant awake and alert and STS with mom. Mom noted to have large compressible breasts and areola, nipples are small, short shafted and everted. Colostrum easily expressible from both breasts. Mom was noted to have increased bleeding and passing clots when infant was BF, she has a history of PPH with her first infant.   Assisted mom in latching infant to left breast in the laid back cross cradle hold. Infant initially fussy and took about 5 minutes to latch. Infant fed on both breasts intermittently over an hour with active feeding 30 minutes during that time. Mom did well with assisting with latch, she was wanting to pull back on breast tissue, enc her to compress/massage breast vs pulling back on breast tissue. Infant with flanged lips, rhythmic suckling and intermittent swallows. Infant was left STS with mom. Discussed positioning in the NB period. Enc mom to massage/compress breast with feedings. Mom was shown how to hand express, colostrum was noted from both breasts.   Enc mom to feed infant 8-12 x in 24 hours at first feeding cues. Enc mom to feed STS and to stimulate infant during feedings as needed to maintain suckling. Enc family to maintain feeding log. BF Resources Handout and LC Brochure given, mom informed of IP/OP Services, BF Support Groups and LC phone #. Enc mom to call her RN for feeding assistance as needed. Follow up tomorrow and prn.        Maternal Data Formula Feeding for Exclusion: No Has patient been taught Hand Expression?: Yes Does the patient have breastfeeding experience prior to this delivery?: Yes  Feeding Feeding Type: Breast Fed Length of feed: 30 min  LATCH Score/Interventions Latch: Repeated attempts needed  to sustain latch, nipple held in mouth throughout feeding, stimulation needed to elicit sucking reflex. Intervention(s): Adjust position;Assist with latch;Breast massage;Breast compression  Audible Swallowing: A few with stimulation Intervention(s): Alternate breast massage;Hand expression;Skin to skin  Type of Nipple: Everted at rest and after stimulation  Comfort (Breast/Nipple): Soft / non-tender     Hold (Positioning): Assistance needed to correctly position infant at breast and maintain latch. Intervention(s): Breastfeeding basics reviewed;Support Pillows;Position options;Skin to skin  LATCH Score: 7  Lactation Tools Discussed/Used WIC Program: No   Consult Status Consult Status: Follow-up Date: 07/30/16 Follow-up type: In-patient    Silas FloodSharon S Kayston Jodoin 07/29/2016, 6:43 PM

## 2016-07-29 NOTE — Progress Notes (Signed)
Pt standing and leaning on counter during contractions. Unable to trace contractions due to this position.  Pt states contractions still coming as frequently as previous charting

## 2016-07-29 NOTE — Progress Notes (Signed)
S:  Aware of ctx - some pressure with ctx in hips         Not able to sleep  O:  VS: Blood pressure 118/70, pulse 74, temperature 97.9 F (36.6 C), temperature source Oral, resp. rate 16, height 5\' 5"  (1.651 m), weight 99.8 kg (220 lb), last menstrual period 10/11/2015.        FHR : baseline 130 / variability moderate / accelerations + / no decelerations        Toco: contractions every 2 minutes / moderate         Cervix : deferred        Membranes: clear leaking  A: latent  labor     FHR category 1  P: continue current management course     Susa LofflerBAILEY, Ubaldo Daywalt CNM, MSN, [facility] 07/29/2016, 5:40 AM

## 2016-07-29 NOTE — Progress Notes (Signed)
S: Standing rocking w/ ctx, + back pain. Pain control options reviewed. Denies PEC s/s. Feels very tired.   O: VSSAF  Mild elevation in BP last 2 check since pain started.  FHR 130, mod var, + accels, no decels Ctx q 2-3 min, palp mod.   SVE 4/90/-1  A/P: Labor augmentation on Pitocin, progressing well. FHT category 1  Elevated BP, no neural s/s  Pain control - epidural now  I/D: GBS prophylaxis ongoing.   Jenny [name], CNM 12:21 PM 07/29/2016

## 2016-07-29 NOTE — Progress Notes (Signed)
Jenny [name] is a 30 y.o. G3P1011 at [id number] by LMP admitted for Labor augmentation, prodromal and SROM  Subjective: Rested some overnight, one dose miso PO so far at 0400. Ctx mild, some pelvic pressure but able to sleep through. No HA/NV/RUQ pain.   Objective: Vitals:   07/29/16 0700 07/29/16 0731 07/29/16 0800 07/29/16 0801  BP: 119/68 125/74  119/68  Pulse: 62 67  68  Resp:  20 16   Temp:   98.1 F (36.7 C)   TempSrc:      Weight:      Height:        No intake/output data recorded. No intake/output data recorded.   FHT:  FHR: 120 bpm, variability: moderate,  accelerations:  Present,  decelerations:  Absent UC:   irregular, every 2-5 minutes SVE:    deferred Bedside sono - vertex, LOP, fetal spine to maternal L, AFI subjectively normal  Labs:   Recent Labs  07/29/16 0130  WBC 11.6*  HGB 11.2*  HCT 34.3*  PLT 189    Assessment / Plan: Protracted labor, SROM x 20 hrs, afebrile  Labor: Start Pitocin augmentation per protocol Preeclampsia:  no signs or symptoms of toxicity Fetal Wellbeing:  Category I Pain Control:  Labor support without medications, freedom of movement, encourage birthing ball and ambulation when ctx become uncomfortable I/D:  GBS prophylaxis per protocol, PCN Anticipated MOD:  NSVD  Neta Mendsaniela C Paul, CNM, MSN 07/29/2016, 9:03 AM

## 2016-07-30 LAB — CBC
HEMATOCRIT: 27.6 % — AB (ref 36.0–46.0)
Hemoglobin: 9.4 g/dL — ABNORMAL LOW (ref 12.0–15.0)
MCH: 31 pg (ref 26.0–34.0)
MCHC: 34.1 g/dL (ref 30.0–36.0)
MCV: 91.1 fL (ref 78.0–100.0)
Platelets: 150 10*3/uL (ref 150–400)
RBC: 3.03 MIL/uL — AB (ref 3.87–5.11)
RDW: 15.6 % — ABNORMAL HIGH (ref 11.5–15.5)
WBC: 9.8 10*3/uL (ref 4.0–10.5)

## 2016-07-30 MED ORDER — RHO D IMMUNE GLOBULIN 1500 UNIT/2ML IJ SOSY
300.0000 ug | PREFILLED_SYRINGE | Freq: Once | INTRAMUSCULAR | Status: AC
  Administered 2016-07-30: 300 ug via INTRAVENOUS
  Filled 2016-07-30: qty 2

## 2016-07-30 MED ORDER — IBUPROFEN 600 MG PO TABS: 600.0000 mg | ORAL_TABLET | Freq: Four times a day (QID) | ORAL | 0 refills | Status: DC

## 2016-07-30 NOTE — Lactation Note (Signed)
This note was copied from a baby's chart. Lactation Consultation Note  Patient Name: Jenny Warner MccreedyLauren Marron ZOXWR'UToday's Date: 07/30/2016 Reason for consult: Follow-up assessment Baby is 24 hours old , and mom requesting early D/C due to self pay.  Baby has been spitty , and has only stool x 1. Has been to the breast breast with latch  Score of 7 with LC after birth and 2nd latch score 5. @ this consult 2 different latches - latch scores 7's  Baby had recently fed for 20 mins at 1400 and 10 mins at 1500. Baby noted to be juicy, not spitty and gaggy  Requiring burping before latch.  Baby latched at the consult 2 different latches , few swallows, sluggish. It is easy to hand express. LC recommended to mom not to allow baby to nipple on to the breast and work to tickle upper lip until wide open mouth  And then latch with breast compressions. LC also encouraged mom to ask dad to help obtain the depth. This is mom's  2nd baby and she breast fed her 1st baby 2 years. Mom denies having engorgement issues  Mother informed of post-discharge support and given phone number to the lactation department, including services for phone  call assistance; out-patient appointments; and breastfeeding support group. List of other breastfeeding resources in the community  given in the handout. Encouraged mother to call for problems or concerns related to breastfeeding. With her 1st baby when her milk came in , it was when she went back to work . Sore nipple and engorgement  Prevention and tx reviewed. Per mom has manual pump and DEBP at home.    Maternal Data Has patient been taught Hand Expression?: Yes  Feeding Feeding Type: Breast Fed Length of feed:  (few sucks )  LATCH Score/Interventions Latch: Repeated attempts needed to sustain latch, nipple held in mouth throughout feeding, stimulation needed to elicit sucking reflex. Intervention(s): Teach feeding cues;Waking techniques;Skin to skin Intervention(s):  Adjust position;Assist with latch;Breast massage;Breast compression  Audible Swallowing: A few with stimulation  Type of Nipple: Everted at rest and after stimulation  Comfort (Breast/Nipple): Soft / non-tender     Hold (Positioning): Assistance needed to correctly position infant at breast and maintain latch. Intervention(s): Breastfeeding basics reviewed;Support Pillows;Position options;Skin to skin  LATCH Score: 7  Lactation Tools Discussed/Used     Consult Status Consult Status: Complete Date: 07/30/16 Follow-up type: In-patient    Matilde SprangMargaret Ann Rikki Smestad 07/30/2016, 5:30 PM

## 2016-07-30 NOTE — Anesthesia Postprocedure Evaluation (Signed)
Anesthesia Post Note  Patient: Jenny [name]  Procedure(s) Performed: * No procedures listed *  Patient location during evaluation: Mother Baby Anesthesia Type: Epidural Level of consciousness: awake and alert Pain management: pain level controlled Vital Signs Assessment: post-procedure vital signs reviewed and stable Respiratory status: spontaneous breathing, nonlabored ventilation and respiratory function stable Cardiovascular status: stable Postop Assessment: no headache, no backache, epidural receding and patient able to bend at knees Anesthetic complications: no     Last Vitals:  Vitals:   07/30/16 0030 07/30/16 0600  BP: 132/73 125/72  Pulse: 64 63  Resp: 18 16  Temp: 37.2 C 36.7 C    Last Pain:  Vitals:   07/30/16 0600  TempSrc: Oral  PainSc:    Pain Goal:                 Rica RecordsICKELTON,Shylo Zamor

## 2016-07-30 NOTE — Progress Notes (Signed)
PPD 1 SVD no repair  S:  Reports feeling good             Tolerating po/ No nausea or vomiting             Bleeding is light             Pain controlled withmotrin             Up ad lib / ambulatory / voiding QS  Newborn breast feeding  [facility]  O:               VS: BP 125/72 (BP Location: Left Arm)   Pulse 63   Temp 98 F (36.7 C) (Oral)   Resp 16   Ht 5\' 5"  (1.651 m)   Wt 99.8 kg (220 lb)   LMP 10/11/2015   Breastfeeding? Unknown   BMI 36.61 kg/m    LABS:              Recent Labs  07/29/16 0130 07/30/16 0612  WBC 11.6* 9.8  HGB 11.2* 9.4*  PLT 189 150               Blood type: --/--/B NEG (10/19 0130) - needs rhogam newborn RH +  Rubella:    Immune                              Physical Exam:             Alert and oriented X3  Abdomen: soft, non-tender, non-distended              Fundus: firm, non-tender, Ueven  Perineum: moderate edema - ice pack inplace  Lochia: moderate  Extremities: 1+ pedal edema, no calf pain or tenderness    A: PPD # 1   Doing well - stable status  P: Routine post partum orders  DC home - if newborn stays tonight will room-in  Marlinda MikeBAILEY, Jarius Dieudonne CNM, MSN, [facility] 07/30/2016, 10:25 AM

## 2016-07-30 NOTE — Discharge Summary (Signed)
Obstetric Discharge Summary  Reason for Admission: rupture of membranes Prenatal Procedures: NST and ultrasound Intrapartum Procedures: spontaneous vaginal delivery, GBS prophylaxis and epidural Postpartum Procedures: Rho(D) Ig Complications-Operative and Postpartum: none Hemoglobin  Date Value Ref Range Status  07/30/2016 9.4 (L) 12.0 - 15.0 g/dL Final   HCT  Date Value Ref Range Status  07/30/2016 27.6 (L) 36.0 - 46.0 % Final    Physical Exam:  General: alert, cooperative and no distress Lochia: appropriate Uterine Fundus: firm Incision: healing well DVT Evaluation: No evidence of DVT seen on physical exam.  Discharge Diagnoses: Term Pregnancy-delivered  Discharge Information: Date: 07/30/2016 Activity: pelvic rest Diet: routine Medications: PNV and Ibuprofen Condition: stable Instructions: refer to practice specific booklet Discharge to: home Follow-up Information    Marlinda MikeBAILEY, Gurvir Schrom, CNM. Schedule an appointment as soon as possible for a visit in 2 week(s).   Specialty:  Obstetrics and Gynecology Contact information: [address] [place] [address] [phone]           Newborn Data: Live born female  Birth Weight: 9 lb 7 oz (4280 g) APGAR: 9, 9  Home with mother.  Marlinda MikeBAILEY, Roshaunda Starkey 07/30/2016, 11:39 AM

## 2016-07-31 LAB — RH IG WORKUP (INCLUDES ABO/RH)
ABO/RH(D): B NEG
Fetal Screen: NEGATIVE
Gestational Age(Wks): 5
Unit division: 0

## 2017-10-11 NOTE — L&D Delivery Note (Signed)
Delivery Note  IOL for Pre ecl w/ DiDi Vtx/Vtx twin gestation  First Stage:  Labor onset: 1154 Induction : Cervical balloon, Pitocin, AROM Analgesia /Anesthesia intrapartum: Epidural AROM Twin A at 1107 AROM Twin B at 1313  Second Stage: Complete dilation at 1237 Onset of pushing at 1235 FHR second stage Cat 1  Bedside U/S confirms vertex presentation of twin A. Sub-optimal pain relief w/ epidural and mother reports strong urge to push. Delivery of Twin A female at 69 with force of ctx x2 by SNM and CNM in attendance in OA to ROA position. No nuchal cord. Infant placed skin-to-skin. Shortened cord double clamped after 2 min and cut by father. Cord blood sample collected.   Bedside U/S confirms Twin B in transverse position and out of pelvis. Pt c/o extreme back pain and intense urge to push w/ ctx. Epidural ineffective for pain management and pt remote from delivery. No relief w/ position changes, voluntary pushing, and dosing of epidural by CRNA. Delay in birth of Twin B R/T pain and epidural replacement. Vac assist delivery for Twin B (see Prog Note by Dr. Juliene Pina for details). Cord blood collected and sent.    Third Stage: Placenta delivered Tomasa Blase S/C/ intact with 3 VC @ 1537 Placenta disposition: Parents desire to donate Uterine tone firm at fundus w/ boggy LUS / bleeding brisk w/ clots and then decreased with massage. PP uterotonics given for poor tone and bleeding.   Intact perineum identified Quant Blood Loss (mL): 998  Complications: Vacuum assist, PPH  Mom to AICU.  Baby to Couplet care / Skin to Skin.  Newborn: Birth Weight: 6# 4.9 oz (2860 g)  Apgar Scores: 1-minute:    Genever, Hentges [[id number]]  8   Nechuma, Boven Mabry [[id number]]  9                           5-minute:    Tiphany, Fayson [[id number]]  [address] [place] [[id number]]  9   Feeding planned: Breast  Rhea Pink, SNM 08/06/2018 7:51 PM

## 2018-03-16 ENCOUNTER — Encounter (HOSPITAL_COMMUNITY): Payer: Self-pay

## 2018-03-16 ENCOUNTER — Other Ambulatory Visit: Payer: Self-pay

## 2018-03-16 ENCOUNTER — Inpatient Hospital Stay (HOSPITAL_COMMUNITY)
Admission: AD | Admit: 2018-03-16 | Discharge: 2018-03-16 | Disposition: A | Discharge status: Home / Self Care | Payer: Medicaid Other | Source: Ambulatory Visit | Attending: Obstetrics & Gynecology | Admitting: Obstetrics & Gynecology

## 2018-03-16 DIAGNOSIS — Z833 Family history of diabetes mellitus: Secondary | ICD-10-CM | POA: Insufficient documentation

## 2018-03-16 DIAGNOSIS — Z79899 Other long term (current) drug therapy: Secondary | ICD-10-CM | POA: Diagnosis not present

## 2018-03-16 DIAGNOSIS — O9989 Other specified diseases and conditions complicating pregnancy, childbirth and the puerperium: Secondary | ICD-10-CM | POA: Diagnosis not present

## 2018-03-16 DIAGNOSIS — Z3482 Encounter for supervision of other normal pregnancy, second trimester: Secondary | ICD-10-CM

## 2018-03-16 DIAGNOSIS — O26892 Other specified pregnancy related conditions, second trimester: Secondary | ICD-10-CM | POA: Diagnosis not present

## 2018-03-16 DIAGNOSIS — Z91018 Allergy to other foods: Secondary | ICD-10-CM | POA: Insufficient documentation

## 2018-03-16 DIAGNOSIS — Z9889 Other specified postprocedural states: Secondary | ICD-10-CM | POA: Insufficient documentation

## 2018-03-16 DIAGNOSIS — Z8249 Family history of ischemic heart disease and other diseases of the circulatory system: Secondary | ICD-10-CM | POA: Insufficient documentation

## 2018-03-16 DIAGNOSIS — Z3A17 17 weeks gestation of pregnancy: Secondary | ICD-10-CM | POA: Insufficient documentation

## 2018-03-16 DIAGNOSIS — R109 Unspecified abdominal pain: Secondary | ICD-10-CM | POA: Diagnosis not present

## 2018-03-16 LAB — URINALYSIS, ROUTINE W REFLEX MICROSCOPIC
Bilirubin Urine: NEGATIVE
GLUCOSE, UA: NEGATIVE mg/dL
Hgb urine dipstick: NEGATIVE
KETONES UR: 20 mg/dL — AB
LEUKOCYTES UA: NEGATIVE
NITRITE: NEGATIVE
PH: 6 (ref 5.0–8.0)
Protein, ur: NEGATIVE mg/dL
SPECIFIC GRAVITY, URINE: 1.018 (ref 1.005–1.030)

## 2018-03-16 LAB — WET PREP, GENITAL
CLUE CELLS WET PREP: NONE SEEN
Sperm: NONE SEEN
Trich, Wet Prep: NONE SEEN
WBC, Wet Prep HPF POC: NONE SEEN
Yeast Wet Prep HPF POC: NONE SEEN

## 2018-03-16 LAB — POCT PREGNANCY, URINE: Preg Test, Ur: POSITIVE — AB

## 2018-03-16 NOTE — MAU Note (Addendum)
Pt states that she had a positive pregnancy test about 2 months ago.  Unknown exact LMP due to irregular periods.   Pt states she has not felt her baby move for a week.  She states she started experiencing abdominal cramping yesterday afternoon. She states that the cramping worsened this morning.   Denies vaginal bleeding or LOF

## 2018-03-16 NOTE — Discharge Instructions (Signed)
Second Trimester of Pregnancy The second trimester is from week 13 through week 28, month 4 through 6. This is often the time in pregnancy that you feel your best. Often times, morning sickness has lessened or quit. You may have more energy, and you may get hungry more often. Your unborn baby (fetus) is growing rapidly. At the end of the sixth month, he or she is about 9 inches long and weighs about 1 pounds. You will likely feel the baby move (quickening) between 18 and 20 weeks of pregnancy. Follow these instructions at home:  Avoid all smoking, herbs, and alcohol. Avoid drugs not approved by your doctor.  Do not use any tobacco products, including cigarettes, chewing tobacco, and electronic cigarettes. If you need help quitting, ask your doctor. You may get counseling or other support to help you quit.  Only take medicine as told by your doctor. Some medicines are safe and some are not during pregnancy.  Exercise only as told by your doctor. Stop exercising if you start having cramps.  Eat regular, healthy meals.  Wear a good support bra if your breasts are tender.  Do not use hot tubs, steam rooms, or saunas.  Wear your seat belt when driving.  Avoid raw meat, uncooked cheese, and liter boxes and soil used by cats.  Take your prenatal vitamins.  Take 1500-2000 milligrams of calcium daily starting at the 20th week of pregnancy until you deliver your baby.  Try taking medicine that helps you poop (stool softener) as needed, and if your doctor approves. Eat more fiber by eating fresh fruit, vegetables, and whole grains. Drink enough fluids to keep your pee (urine) clear or pale yellow.  Take warm water baths (sitz baths) to soothe pain or discomfort caused by hemorrhoids. Use hemorrhoid cream if your doctor approves.  If you have puffy, bulging veins (varicose veins), wear support hose. Raise (elevate) your feet for 15 minutes, 3-4 times a day. Limit salt in your diet.  Avoid heavy  lifting, wear low heals, and sit up straight.  Rest with your legs raised if you have leg cramps or low back pain.  Visit your dentist if you have not gone during your pregnancy. Use a soft toothbrush to brush your teeth. Be gentle when you floss.  You can have sex (intercourse) unless your doctor tells you not to.  Go to your doctor visits. Get help if:  You feel dizzy.  You have mild cramps or pressure in your lower belly (abdomen).  You have a nagging pain in your belly area.  You continue to feel sick to your stomach (nauseous), throw up (vomit), or have watery poop (diarrhea).  You have bad smelling fluid coming from your vagina.  You have pain with peeing (urination). Get help right away if:  You have a fever.  You are leaking fluid from your vagina.  You have spotting or bleeding from your vagina.  You have severe belly cramping or pain.  You lose or gain weight rapidly.  You have trouble catching your breath and have chest pain.  You notice sudden or extreme puffiness (swelling) of your face, hands, ankles, feet, or legs.  You have not felt the baby move in over an hour.  You have severe headaches that do not go away with medicine.  You have vision changes. This information is not intended to replace advice given to you by your health care provider. Make sure you discuss any questions you have with your health care   provider. Document Released: 12/22/2009 Document Revised: 03/04/2016 Document Reviewed: 11/28/2012 Elsevier Interactive Patient Education  2017 [employer]  

## 2018-03-16 NOTE — MAU Provider Note (Addendum)
History     CSN: [id number]  Arrival date and time: 03/16/18 1524   None     Chief Complaint  Patient presents with  . Abdominal Cramping    reports occasional cramping within past 24 hours   HPI  Jenny [name] is a 32 y.o. [id number] at [redacted]w[redacted]d by LMP who presents to MAU for cramping that she describes as "weird" and "different from my other pregnancies" occurring earlier today and occasionally over the past 24 hours. Denies vaginal bleeding, headaches, fever, recent fall.  Patient states that she is scheduled to have her initial NOB appointment at [facility] Wed 03/22/18  Pertinent Gynecological History: Menses: irregular occurring approximately every 28-40 days with spotting approximately 5-7 days per month Bleeding: N/A Contraception: none, previously used IUD but reports it was removed due to abdominal pain DES exposure: denies Blood transfusions: none Sexually transmitted diseases: no past history Previous GYN Procedures: N/A  Last mammogram: N/A age 5  Last pap: normal Date: 11/02/2012   Past Medical History:  Diagnosis Date  . Shortness of breath    when running  . Urinary tract infection   . Vaginal Pap smear, abnormal     Past Surgical History:  Procedure Laterality Date  . LIP REPAIR      Family History  Problem Relation Age of Onset  . Diabetes Paternal Grandfather   . Stroke Paternal Grandmother   . Diabetes Paternal Grandmother   . Stroke Maternal Grandmother   . Stroke Maternal Grandfather     Social History   Tobacco Use  . Smoking status: Never Smoker  . Smokeless tobacco: Never Used  Substance Use Topics  . Alcohol use: No  . Drug use: No    Allergies:  Allergies  Allergen Reactions  . 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 Prenatal Vit-Fe Fumarate-FA (PRENATAL MULTIVITAMIN) TABS tablet Take 1 tablet by mouth daily at 12 noon.    Past Week at Unknown time    Review of Systems  Constitutional: Negative for activity change and appetite change.       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   Respiratory: Negative for shortness of breath and wheezing.   Cardiovascular: Negative for chest pain, palpitations and leg swelling.  Gastrointestinal: Positive for abdominal pain. Negative for nausea and vomiting.       "crampy" all over low abdomen  Genitourinary: Negative for flank pain, pelvic pain, vaginal bleeding, vaginal discharge and vaginal pain.  Neurological: Negative for weakness, light-headedness and headaches.  Psychiatric/Behavioral: Suicidal ideas: A:   Physical Exam   Blood pressure 127/73, pulse 99, temperature 98.3 F (36.8 C), temperature source Oral, resp. rate 19, weight 194 lb 0.1 oz (88 kg), last menstrual period 11/15/2017, SpO2 96 %, unknown if currently breastfeeding.  Physical Exam  Nursing note and vitals reviewed. Constitutional: She is oriented to person, place, and time. She appears well-developed and well-nourished.  HENT:  Head: Normocephalic and atraumatic.  Neck: Normal range of motion. Neck supple.  Cardiovascular: Normal rate and regular rhythm.  GI: Soft. Bowel sounds are normal. She exhibits no distension and no mass. There is no tenderness. There is no rebound and no guarding.  Genitourinary: Vagina normal and uterus normal.  Genitourinary Comments: SVE closed/thick/posterior  Musculoskeletal: Normal range of motion.  Neurological: She is alert and oriented to person, place,  and time. She has normal reflexes.  Skin: Skin is warm and dry.  Psychiatric: She has a normal mood and affect. Her behavior is normal. Judgment and thought content normal.    MAU Course  Procedures None  MDM Orders Placed This Encounter  Procedures  . Wet prep, genital  . Urinalysis, Routine w reflex  microscopic  . Pregnancy, urine POC  . Discharge patient   Lab results and nursing notes independently reviewed by me  Assessment and Plan  A: --32 y.o. female [phone] at 3539w2d by LMP --FHT 140 by doppler today --Unremarkable physical exam  P: --[facility] --Patient to continue taking Prenatal Vitamins  --Second trimester development and precautions reviewed (see AVS) --Discharge home in stable condition   Calvert CantorSamantha C Weinhold, CNM 03/16/2018, 5:30 PM

## 2018-03-17 LAB — GC/CHLAMYDIA PROBE AMP (~~LOC~~) NOT AT ARMC
CHLAMYDIA, DNA PROBE: NEGATIVE
NEISSERIA GONORRHEA: NEGATIVE

## 2018-03-22 LAB — OB RESULTS CONSOLE RPR: RPR: NONREACTIVE

## 2018-03-22 LAB — OB RESULTS CONSOLE RUBELLA ANTIBODY, IGM: Rubella: IMMUNE

## 2018-03-22 LAB — OB RESULTS CONSOLE HIV ANTIBODY (ROUTINE TESTING): HIV: NONREACTIVE

## 2018-03-22 LAB — OB RESULTS CONSOLE HEPATITIS B SURFACE ANTIGEN: Hepatitis B Surface Ag: NEGATIVE

## 2018-03-27 ENCOUNTER — Other Ambulatory Visit (HOSPITAL_COMMUNITY): Payer: Self-pay | Admitting: Certified Nurse Midwife

## 2018-03-27 ENCOUNTER — Other Ambulatory Visit (HOSPITAL_COMMUNITY): Payer: Self-pay

## 2018-03-27 DIAGNOSIS — Z3A19 19 weeks gestation of pregnancy: Secondary | ICD-10-CM

## 2018-03-27 DIAGNOSIS — Z363 Encounter for antenatal screening for malformations: Secondary | ICD-10-CM

## 2018-03-31 ENCOUNTER — Other Ambulatory Visit (HOSPITAL_COMMUNITY): Payer: Self-pay

## 2018-03-31 ENCOUNTER — Ambulatory Visit (HOSPITAL_COMMUNITY)
Admission: RE | Admit: 2018-03-31 | Discharge: 2018-03-31 | Disposition: A | Discharge status: Home / Self Care | Payer: Medicaid Other | Source: Ambulatory Visit

## 2018-03-31 DIAGNOSIS — Z3A19 19 weeks gestation of pregnancy: Secondary | ICD-10-CM

## 2018-03-31 DIAGNOSIS — O30043 Twin pregnancy, dichorionic/diamniotic, third trimester: Secondary | ICD-10-CM

## 2018-03-31 DIAGNOSIS — O30042 Twin pregnancy, dichorionic/diamniotic, second trimester: Secondary | ICD-10-CM | POA: Insufficient documentation

## 2018-03-31 DIAGNOSIS — Z363 Encounter for antenatal screening for malformations: Secondary | ICD-10-CM

## 2018-04-03 ENCOUNTER — Other Ambulatory Visit (HOSPITAL_COMMUNITY): Payer: Self-pay | Admitting: *Deleted

## 2018-04-03 DIAGNOSIS — O30042 Twin pregnancy, dichorionic/diamniotic, second trimester: Secondary | ICD-10-CM

## 2018-04-28 ENCOUNTER — Ambulatory Visit (HOSPITAL_COMMUNITY)
Admission: RE | Admit: 2018-04-28 | Discharge: 2018-04-28 | Disposition: A | Discharge status: Home / Self Care | Payer: Medicaid Other | Source: Ambulatory Visit

## 2018-04-28 ENCOUNTER — Encounter (HOSPITAL_COMMUNITY): Payer: Self-pay

## 2018-04-28 ENCOUNTER — Other Ambulatory Visit (HOSPITAL_COMMUNITY): Payer: Self-pay | Admitting: Maternal and Fetal Medicine

## 2018-04-28 DIAGNOSIS — O30042 Twin pregnancy, dichorionic/diamniotic, second trimester: Secondary | ICD-10-CM

## 2018-04-28 DIAGNOSIS — Z362 Encounter for other antenatal screening follow-up: Secondary | ICD-10-CM | POA: Insufficient documentation

## 2018-04-28 DIAGNOSIS — Z3A22 22 weeks gestation of pregnancy: Secondary | ICD-10-CM | POA: Diagnosis not present

## 2018-05-20 ENCOUNTER — Encounter (HOSPITAL_COMMUNITY): Payer: Self-pay

## 2018-05-20 ENCOUNTER — Inpatient Hospital Stay (HOSPITAL_COMMUNITY)
Admission: AD | Admit: 2018-05-20 | Discharge: 2018-05-21 | Disposition: A | Discharge status: Home / Self Care | Payer: Medicaid Other | Source: Ambulatory Visit | Attending: Obstetrics and Gynecology | Admitting: Obstetrics and Gynecology

## 2018-05-20 ENCOUNTER — Other Ambulatory Visit: Payer: Self-pay

## 2018-05-20 DIAGNOSIS — O26899 Other specified pregnancy related conditions, unspecified trimester: Secondary | ICD-10-CM | POA: Diagnosis present

## 2018-05-20 DIAGNOSIS — O9989 Other specified diseases and conditions complicating pregnancy, childbirth and the puerperium: Secondary | ICD-10-CM | POA: Diagnosis not present

## 2018-05-20 DIAGNOSIS — O30042 Twin pregnancy, dichorionic/diamniotic, second trimester: Secondary | ICD-10-CM | POA: Diagnosis present

## 2018-05-20 DIAGNOSIS — R197 Diarrhea, unspecified: Secondary | ICD-10-CM | POA: Insufficient documentation

## 2018-05-20 DIAGNOSIS — Z3A25 25 weeks gestation of pregnancy: Secondary | ICD-10-CM | POA: Diagnosis not present

## 2018-05-20 LAB — CBC
HCT: 34.1 % — ABNORMAL LOW (ref 36.0–46.0)
Hemoglobin: 11.5 g/dL — ABNORMAL LOW (ref 12.0–15.0)
MCH: 31.7 pg (ref 26.0–34.0)
MCHC: 33.7 g/dL (ref 30.0–36.0)
MCV: 93.9 fL (ref 78.0–100.0)
Platelets: 175 10*3/uL (ref 150–400)
RBC: 3.63 MIL/uL — ABNORMAL LOW (ref 3.87–5.11)
RDW: 14.3 % (ref 11.5–15.5)
WBC: 10.4 10*3/uL (ref 4.0–10.5)

## 2018-05-20 LAB — URINALYSIS, ROUTINE W REFLEX MICROSCOPIC
Bilirubin Urine: NEGATIVE
GLUCOSE, UA: NEGATIVE mg/dL
HGB URINE DIPSTICK: NEGATIVE
Ketones, ur: 20 mg/dL — AB
Leukocytes, UA: NEGATIVE
Nitrite: NEGATIVE
PH: 5 (ref 5.0–8.0)
PROTEIN: NEGATIVE mg/dL
Specific Gravity, Urine: 1.019 (ref 1.005–1.030)

## 2018-05-20 MED ORDER — LACTATED RINGERS IV BOLUS: 1000.0000 mL | Freq: Once | INTRAVENOUS | Status: DC

## 2018-05-20 MED ORDER — LACTATED RINGERS IV BOLUS
1000.0000 mL | Freq: Once | INTRAVENOUS | Status: AC
  Administered 2018-05-20: 1000 mL via INTRAVENOUS

## 2018-05-20 MED ORDER — LOPERAMIDE HCL 2 MG PO CAPS: 4.0000 mg | ORAL_CAPSULE | Freq: Once | ORAL | Status: DC

## 2018-05-20 NOTE — MAU Note (Addendum)
For 4 days I have had diarrhea. Have been watery until this afternoon. No BM for 4 hrs and last couple stools were loose. No one at home sick. My husband has been on antibiotics and has hx of C Diff but last time 2 yrs ago. Some nausea and heartburn but no vomiting. Some abd cramping

## 2018-05-20 NOTE — MAU Note (Signed)
Pt. States she is having diarrhea and cramping. States she has gone 12 times today and it is very runny. Reports it started 4 days ago. States 4 1/2 since last going. Thinks it may be [facility]. Reports husband has had C. Diff several times and his colon is now removed. States he is a carrier. 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 +FM Denies vaginal bleeding.

## 2018-05-21 DIAGNOSIS — R197 Diarrhea, unspecified: Secondary | ICD-10-CM

## 2018-05-21 DIAGNOSIS — O26899 Other specified pregnancy related conditions, unspecified trimester: Secondary | ICD-10-CM | POA: Diagnosis present

## 2018-05-21 MED ORDER — LOPERAMIDE HCL 2 MG PO CAPS
2.0000 mg | ORAL_CAPSULE | Freq: Once | ORAL | Status: AC
  Administered 2018-05-21: 2 mg via ORAL
  Filled 2018-05-21: qty 1

## 2018-05-21 NOTE — MAU Provider Note (Signed)
History     CSN: [id number]  Arrival date and time: 05/20/18 2035   First Provider Initiated Contact with Patient 05/20/18 2126      Chief Complaint  Patient presents with  . Diarrhea  . Abdominal Pain   HPI  OB History    Gravida  4   Para  2   Term  2   Preterm  0   AB  1   Living  2     SAB  1   TAB  0   Ectopic  0   Multiple  0   Live Births  2         Jenny [name] is a 32 y.o. [id number] at [redacted]w[redacted]d with di/di twin IUP.  Patient presents to MAU w/ hx of diarrhea for past 4 days, today notes last episode of loose stool at 1600, no further watery diarrhea, more like formed soft stool. Concerned about C-Dif, spouse has previously been diagnosed with this. Denies bloody stool. Multiple episodes of loose stool, minimal food intake. Felt tired and dehydrated last couple days. + FM, no LOF/VB, no ctx.  Received prenatal care at [facility].  Past Medical History:  Diagnosis Date  . Shortness of breath    when running  . Urinary tract infection   . Vaginal Pap smear, abnormal     Past Surgical History:  Procedure Laterality Date  . LIP REPAIR      Family History  Problem Relation Age of Onset  . Diabetes Paternal Grandfather   . Stroke Paternal Grandmother   . Diabetes Paternal Grandmother   . Stroke Maternal Grandmother   . Stroke Maternal Grandfather     Social History   Tobacco Use  . Smoking status: Never Smoker  . Smokeless tobacco: Never Used  Substance Use Topics  . Alcohol use: No  . Drug use: No    Allergies:  Allergies  Allergen Reactions  . 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 Prenatal Vit-Fe Fumarate-FA (PRENATAL MULTIVITAMIN) TABS tablet Take 1 tablet by mouth daily at 12 noon.   Taking    Review of Systems Physical Exam   Blood pressure 120/73, pulse (!) 101, temperature 97.6 F (36.4 C), resp. rate 20, height 5\' 5"   (1.651 m), weight 93 kg, last menstrual period 11/15/2017, unknown if currently breastfeeding.  Physical Exam  AAO x 3, NAD Abd: gravid, NT GU deferred Ext: no edema  FHT: A - 140, mod var, + small accels, no decels          B - 140, mod var, + accels, no decels Toco: no ctx  MAU Course  Procedures LR 1 L bolus given Labs CBC    Component Value Date/Time   WBC 10.4 05/20/2018 2223   RBC 3.63 (L) 05/20/2018 2223   HGB 11.5 (L) 05/20/2018 2223   HCT 34.1 (L) 05/20/2018 2223   PLT 175 05/20/2018 2223   MCV 93.9 05/20/2018 2223   MCH 31.7 05/20/2018 2223   MCHC 33.7 05/20/2018 2223   RDW 14.3 05/20/2018 2223   LYMPHSABS 1.8 11/02/2012 1200   MONOABS 0.7 11/02/2012 1200   EOSABS 0.2 11/02/2012 1200   BASOSABS 0.0 11/02/2012 1200   Urinalysis    Component Value Date/Time   COLORURINE YELLOW 05/20/2018 2102   APPEARANCEUR HAZY (A) 05/20/2018 2102   LABSPEC 1.019 05/20/2018 2102   PHURINE 5.0 05/20/2018 2102   GLUCOSEU NEGATIVE  05/20/2018 2102   HGBUR NEGATIVE 05/20/2018 2102   BILIRUBINUR NEGATIVE 05/20/2018 2102   KETONESUR 20 (A) 05/20/2018 2102   PROTEINUR NEGATIVE 05/20/2018 2102   UROBILINOGEN 0.2 02/17/2013 1740   NITRITE NEGATIVE 05/20/2018 2102   LEUKOCYTESUR NEGATIVE 05/20/2018 2102   No further BM during MAU stay for collection  Assessment and Plan  G4P2012 at 4475w6d , twin IUP Di/Di Gastric upset - food vs viral enterocolitis FHT cat 1 x 2 NIL  UA shows no significant dehydration CBC grossly wnl  Will DC home with precautions, BRAT diet next few days, encouraged hydration and rest, no dairy products for next week.  Loperamide 2 mg now then OTC as needed.   F/[facility] as scheduled at [facility] in 1 weeks as scheduled.  POC in consult with Dr. Alvin Critchleyaavon   Kamaljit Hizer C Donie Moulton 05/21/2018, 12:49 AM

## 2018-05-26 ENCOUNTER — Ambulatory Visit (HOSPITAL_COMMUNITY)
Admission: RE | Admit: 2018-05-26 | Discharge: 2018-05-26 | Disposition: A | Discharge status: Home / Self Care | Payer: Medicaid Other | Source: Ambulatory Visit | Attending: Obstetrics & Gynecology | Admitting: Obstetrics & Gynecology

## 2018-05-26 ENCOUNTER — Encounter (HOSPITAL_COMMUNITY): Payer: Self-pay

## 2018-05-26 DIAGNOSIS — R197 Diarrhea, unspecified: Secondary | ICD-10-CM

## 2018-05-26 DIAGNOSIS — Z3A26 26 weeks gestation of pregnancy: Secondary | ICD-10-CM | POA: Diagnosis not present

## 2018-05-26 DIAGNOSIS — O30042 Twin pregnancy, dichorionic/diamniotic, second trimester: Secondary | ICD-10-CM | POA: Diagnosis not present

## 2018-05-26 DIAGNOSIS — Z362 Encounter for other antenatal screening follow-up: Secondary | ICD-10-CM | POA: Diagnosis not present

## 2018-05-26 DIAGNOSIS — O26899 Other specified pregnancy related conditions, unspecified trimester: Secondary | ICD-10-CM

## 2018-05-29 ENCOUNTER — Other Ambulatory Visit (HOSPITAL_COMMUNITY): Payer: Self-pay | Admitting: *Deleted

## 2018-05-29 DIAGNOSIS — O30043 Twin pregnancy, dichorionic/diamniotic, third trimester: Secondary | ICD-10-CM

## 2018-06-23 ENCOUNTER — Other Ambulatory Visit (HOSPITAL_COMMUNITY): Payer: Self-pay | Admitting: *Deleted

## 2018-06-23 ENCOUNTER — Encounter (HOSPITAL_COMMUNITY): Payer: Self-pay

## 2018-06-23 ENCOUNTER — Ambulatory Visit (HOSPITAL_COMMUNITY)
Admission: RE | Admit: 2018-06-23 | Discharge: 2018-06-23 | Disposition: A | Discharge status: Home / Self Care | Payer: Medicaid Other | Source: Ambulatory Visit | Attending: Family Medicine | Admitting: Family Medicine

## 2018-06-23 DIAGNOSIS — Z3A30 30 weeks gestation of pregnancy: Secondary | ICD-10-CM | POA: Insufficient documentation

## 2018-06-23 DIAGNOSIS — Z362 Encounter for other antenatal screening follow-up: Secondary | ICD-10-CM | POA: Insufficient documentation

## 2018-06-23 DIAGNOSIS — O30043 Twin pregnancy, dichorionic/diamniotic, third trimester: Secondary | ICD-10-CM

## 2018-06-23 DIAGNOSIS — O30049 Twin pregnancy, dichorionic/diamniotic, unspecified trimester: Secondary | ICD-10-CM

## 2018-06-26 ENCOUNTER — Other Ambulatory Visit (HOSPITAL_COMMUNITY): Payer: Self-pay | Admitting: *Deleted

## 2018-06-26 DIAGNOSIS — O30043 Twin pregnancy, dichorionic/diamniotic, third trimester: Secondary | ICD-10-CM

## 2018-07-21 ENCOUNTER — Ambulatory Visit (HOSPITAL_COMMUNITY)
Admission: RE | Admit: 2018-07-21 | Discharge: 2018-07-21 | Disposition: A | Discharge status: Home / Self Care | Payer: Medicaid Other | Source: Ambulatory Visit

## 2018-07-21 DIAGNOSIS — O30043 Twin pregnancy, dichorionic/diamniotic, third trimester: Secondary | ICD-10-CM | POA: Insufficient documentation

## 2018-07-21 DIAGNOSIS — Z3A34 34 weeks gestation of pregnancy: Secondary | ICD-10-CM | POA: Insufficient documentation

## 2018-07-21 DIAGNOSIS — Z362 Encounter for other antenatal screening follow-up: Secondary | ICD-10-CM | POA: Diagnosis not present

## 2018-07-21 DIAGNOSIS — O30042 Twin pregnancy, dichorionic/diamniotic, second trimester: Secondary | ICD-10-CM

## 2018-07-21 DIAGNOSIS — O30049 Twin pregnancy, dichorionic/diamniotic, unspecified trimester: Secondary | ICD-10-CM

## 2018-07-25 LAB — OB RESULTS CONSOLE GBS: GBS: NEGATIVE

## 2018-08-01 ENCOUNTER — Encounter (HOSPITAL_COMMUNITY): Payer: Self-pay | Admitting: *Deleted

## 2018-08-01 ENCOUNTER — Other Ambulatory Visit: Payer: Self-pay

## 2018-08-01 ENCOUNTER — Inpatient Hospital Stay (HOSPITAL_COMMUNITY)
Admission: AD | Admit: 2018-08-01 | Discharge: 2018-08-09 | DRG: 768 | Disposition: A | Discharge status: Home / Self Care | Payer: Medicaid Other

## 2018-08-01 DIAGNOSIS — O30043 Twin pregnancy, dichorionic/diamniotic, third trimester: Secondary | ICD-10-CM | POA: Diagnosis present

## 2018-08-01 DIAGNOSIS — O1490 Unspecified pre-eclampsia, unspecified trimester: Secondary | ICD-10-CM | POA: Diagnosis present

## 2018-08-01 DIAGNOSIS — O1403 Mild to moderate pre-eclampsia, third trimester: Secondary | ICD-10-CM

## 2018-08-01 DIAGNOSIS — Z8759 Personal history of other complications of pregnancy, childbirth and the puerperium: Secondary | ICD-10-CM

## 2018-08-01 DIAGNOSIS — Z3490 Encounter for supervision of normal pregnancy, unspecified, unspecified trimester: Secondary | ICD-10-CM | POA: Diagnosis not present

## 2018-08-01 DIAGNOSIS — R197 Diarrhea, unspecified: Secondary | ICD-10-CM

## 2018-08-01 DIAGNOSIS — Z3A36 36 weeks gestation of pregnancy: Secondary | ICD-10-CM

## 2018-08-01 DIAGNOSIS — O9962 Diseases of the digestive system complicating childbirth: Secondary | ICD-10-CM | POA: Diagnosis present

## 2018-08-01 DIAGNOSIS — O26893 Other specified pregnancy related conditions, third trimester: Secondary | ICD-10-CM | POA: Diagnosis present

## 2018-08-01 DIAGNOSIS — O9081 Anemia of the puerperium: Secondary | ICD-10-CM | POA: Diagnosis not present

## 2018-08-01 DIAGNOSIS — O1493 Unspecified pre-eclampsia, third trimester: Secondary | ICD-10-CM

## 2018-08-01 DIAGNOSIS — O26899 Other specified pregnancy related conditions, unspecified trimester: Secondary | ICD-10-CM

## 2018-08-01 DIAGNOSIS — Z6791 Unspecified blood type, Rh negative: Secondary | ICD-10-CM

## 2018-08-01 DIAGNOSIS — D62 Acute posthemorrhagic anemia: Secondary | ICD-10-CM | POA: Diagnosis not present

## 2018-08-01 DIAGNOSIS — O1404 Mild to moderate pre-eclampsia, complicating childbirth: Principal | ICD-10-CM | POA: Diagnosis present

## 2018-08-01 DIAGNOSIS — R12 Heartburn: Secondary | ICD-10-CM | POA: Diagnosis present

## 2018-08-01 HISTORY — DX: Mild to moderate pre-eclampsia, third trimester: O14.03

## 2018-08-01 LAB — CBC
HCT: 31.2 % — ABNORMAL LOW (ref 36.0–46.0)
Hemoglobin: 10.4 g/dL — ABNORMAL LOW (ref 12.0–15.0)
MCH: 29.7 pg (ref 26.0–34.0)
MCHC: 33.3 g/dL (ref 30.0–36.0)
MCV: 89.1 fL (ref 80.0–100.0)
Platelets: 192 10*3/uL (ref 150–400)
RBC: 3.5 MIL/uL — ABNORMAL LOW (ref 3.87–5.11)
RDW: 13.6 % (ref 11.5–15.5)
WBC: 8.6 10*3/uL (ref 4.0–10.5)
nRBC: 0 % (ref 0.0–0.2)

## 2018-08-01 LAB — PROTEIN / CREATININE RATIO, URINE
Creatinine, Urine: 333 mg/dL
Protein Creatinine Ratio: 0.76 mg/mg{Cre} — ABNORMAL HIGH (ref 0.00–0.15)
Total Protein, Urine: 252 mg/dL

## 2018-08-01 LAB — COMPREHENSIVE METABOLIC PANEL
ALT: 17 U/L (ref 0–44)
AST: 22 U/L (ref 15–41)
Albumin: 2.3 g/dL — ABNORMAL LOW (ref 3.5–5.0)
Alkaline Phosphatase: 137 U/L — ABNORMAL HIGH (ref 38–126)
Anion gap: 8 (ref 5–15)
BUN: 11 mg/dL (ref 6–20)
CO2: 17 mmol/L — ABNORMAL LOW (ref 22–32)
Calcium: 8.2 mg/dL — ABNORMAL LOW (ref 8.9–10.3)
Chloride: 113 mmol/L — ABNORMAL HIGH (ref 98–111)
Creatinine, Ser: 0.78 mg/dL (ref 0.44–1.00)
GFR calc Af Amer: >60 mL/min (ref >60)
GFR calc non Af Amer: >60 mL/min (ref >60)
Glucose, Bld: 106 mg/dL — ABNORMAL HIGH (ref 70–99)
Potassium: 3.8 mmol/L (ref 3.5–5.1)
Sodium: 138 mmol/L (ref 135–145)
Total Bilirubin: 0.7 mg/dL (ref 0.3–1.2)
Total Protein: 4.8 g/dL — ABNORMAL LOW (ref 6.5–8.1)

## 2018-08-01 LAB — URIC ACID: Uric Acid, Serum: 6 mg/dL (ref 2.5–7.1)

## 2018-08-01 MED ORDER — ZOLPIDEM TARTRATE 5 MG PO TABS: 5.0000 mg | ORAL_TABLET | Freq: Every evening | ORAL | Status: DC | PRN

## 2018-08-01 MED ORDER — ACETAMINOPHEN 325 MG PO TABS
650.0000 mg | ORAL_TABLET | ORAL | Status: DC | PRN
Start: 2018-08-01 — End: 2018-08-06
  Administered 2018-08-03: 650 mg via ORAL
  Filled 2018-08-01: qty 2

## 2018-08-01 MED ORDER — BETAMETHASONE SOD PHOS & ACET 6 (3-3) MG/ML IJ SUSP
12.0000 mg | INTRAMUSCULAR | Status: AC
  Administered 2018-08-01 – 2018-08-02 (×2): 12 mg via INTRAMUSCULAR
  Filled 2018-08-01 (×2): qty 2

## 2018-08-01 MED ORDER — PRENATAL MULTIVITAMIN CH
1.0000 | ORAL_TABLET | Freq: Every day | ORAL | Status: DC
  Administered 2018-08-02 – 2018-08-05 (×4): 1 via ORAL
  Filled 2018-08-01 (×4): qty 1

## 2018-08-01 MED ORDER — CALCIUM CARBONATE ANTACID 500 MG PO CHEW: 2.0000 | CHEWABLE_TABLET | ORAL | Status: DC | PRN

## 2018-08-01 MED ORDER — DOCUSATE SODIUM 100 MG PO CAPS
100.0000 mg | ORAL_CAPSULE | Freq: Every day | ORAL | Status: DC
  Administered 2018-08-02: 100 mg via ORAL
  Filled 2018-08-01 (×3): qty 1

## 2018-08-01 NOTE — MAU Provider Note (Signed)
History     CSN: [id number]  Arrival date and time: 08/01/18 1858 Call from nurse - orders given @1921  Provider on unit @ 2020  Chief Complaint  Patient presents with  . Leg Swelling   HPI Seen in office today - URI and dependent edema Sinus congestion and pain Elevated BP with 10 pound weight gain in 1 week  Dating: EDC by LMP 08/22/2018 and EDC by sono (twinB biometry) 08/28/2018 Di-Di twins - female with vtx/vtx presentation at last growth sono with no significant discordance  Past Medical History:  Diagnosis Date  . Shortness of breath    when running  . Urinary tract infection   . Vaginal Pap smear, abnormal    Past Surgical History:  Procedure Laterality Date  . LIP REPAIR     Family History  Problem Relation Age of Onset  . Diabetes Paternal Grandfather   . Stroke Paternal Grandmother   . Diabetes Paternal Grandmother   . Stroke Maternal Grandmother   . Stroke Maternal Grandfather    Social History   Tobacco Use  . Smoking status: Never Smoker  . Smokeless tobacco: Never Used  Substance Use Topics  . Alcohol use: No  . Drug use: No   Allergies:  Allergies  Allergen Reactions  . Horseradish [Armoracia Rusticana Ext (Horseradish)] Anaphylaxis    Medications Prior to Admission  Medication Sig Dispense Refill Last Dose  . Prenatal Vit-Fe Fumarate-FA (PRENATAL MULTIVITAMIN) TABS tablet Take 1 tablet by mouth daily at 12 noon.   Past Week at Unknown time   Review of Systems Physical Exam   Blood pressure 140/89, pulse 85, temperature 98.2 F (36.8 C), temperature source Oral, last menstrual period 11/15/2017, unknown if currently breastfeeding.   BP: 150/93 - 140/95  Physical Exam  Alert and oriented - no acute neuro symptoms, generalized edema present Lung clear, no cough, moderate sinus congestion Gravid abdomen - panus with marked dependent edema with peau d'orange appearance to surface NST reactive x 2 VE: 2cm / posterior / soft / 60% vtx  -3 Extremities 3+ pedal edema  Results for Jenny [name], Jenny [name] (MRN [id number]) as of 08/01/2018 20:25  Ref. Range 08/01/2018 19:42  Sodium Latest Ref Range: 135 - 145 mmol/L 138  Potassium Latest Ref Range: 3.5 - 5.1 mmol/L 3.8  Chloride Latest Ref Range: 98 - 111 mmol/L 113 (H)  CO2 Latest Ref Range: 22 - 32 mmol/L 17 (L)  Glucose Latest Ref Range: 70 - 99 mg/dL 973 (H)  BUN Latest Ref Range: 6 - 20 mg/dL 11  Creatinine Latest Ref Range: 0.44 - 1.00 mg/dL 5.32  Calcium Latest Ref Range: 8.9 - 10.3 mg/dL 8.2 (L)  Anion gap Latest Ref Range: 5 - 15  8  Alkaline Phosphatase Latest Ref Range: 38 - 126 U/L 137 (H)  Albumin Latest Ref Range: 3.5 - 5.0 g/dL 2.3 (L)  Uric Acid, Serum Latest Ref Range: 2.5 - 7.1 mg/dL 6.0  AST Latest Ref Range: 15 - 41 U/L 22  ALT Latest Ref Range: 0 - 44 U/L 17  Total Protein Latest Ref Range: 6.5 - 8.1 g/dL 4.8 (L)  WBC Latest Ref Range: 4.0 - 10.5 K/uL 8.6  RBC Latest Ref Range: 3.87 - 5.11 MIL/uL 3.50 (L)  Hemoglobin Latest Ref Range: 12.0 - 15.0 g/dL 99.2 (L)  HCT Latest Ref Range: 36.0 - 46.0 % 31.2 (L)  Platelets Latest Ref Range: 150 - 400 K/uL 192   Results for Jenny [name], Jenny [name] (MRN [id number]) as of 08/01/2018 21:23  Ref. Range 08/01/2018 19:44  Total Protein, Urine Latest Units: mg/dL 161  Protein Creatinine Ratio Latest Ref Range: 0.00 - 0.15 mg/mgCre 0.76 (H)  Creatinine, Urine Latest Units: mg/dL 096.04    MAU Course  Procedures - NST - twins  Assessment and Plan  36.1 weeks DI-DI twins with development of PEC gestational hypertension with new onset proteinuria (abnormal protein-creat ratio) and dependent edema  Consult Dr Billy Coast - recommend BMZ course / admit - repeat labs in AM / MFM consult  Marlinda Mike 08/01/2018, 8:31 PM

## 2018-08-01 NOTE — MAU Note (Signed)
Pt presents to MAU per Marlinda Mike CNM, pt is c/o swelling and a HA. Pt reports a hx of preeclampsia. Pt reports +FM no bleeding no leaking and no ctx. Pt is pregnant with twins. No RUQ pain or blurry vision.

## 2018-08-01 NOTE — H&P (Signed)
  OB ADMISSION/ HISTORY & PHYSICAL:  Admission Date: 08/01/2018  6:58 PM  Admit Diagnosis: DI-DI twins / new onset gestational hypertension with proteinuria / mild PEC  Jenny [name] is a 32 y.o. female presenting for dependent edema and elevated BP in office today.  Prenatal History: [id number]   EDC: 08/22/2018 by LMP dating and 08/28/2018, by Ultrasound (twin B biometry for dating) Prenatal care at [facility] Prenatal course complicated by twins / hx PEC in first pregnancy  Prenatal Labs: ABO, Rh:  B negative Antibody:  Negative Rubella:   Immune RPR:   NR HBsAg:   Negative HIV:   NR GTT: normal GBS:   negative  OB History  Gravida Para Term Preterm AB Living  4 2 2  0 1 2  SAB TAB Ectopic Multiple Live Births  1 0 0 0 2    # Outcome Date GA Lbr Len/2nd Weight Sex Delivery Anes PTL Lv  4 Current           3 Term 07/29/16 [redacted]w[redacted]d 03:21 / 00:34 4280 g M Vag-Spont EPI  LIV  2 Term 02/06/13 [redacted]w[redacted]d 29:06 / 05:05 3714 g M Vag-Spont EPI  LIV  1 SAB             Medical / Surgical History :  Past medical history:  Past Medical History:  Diagnosis Date  . Shortness of breath    when running  . Urinary tract infection   . Vaginal Pap smear, abnormal      Past surgical history:  Past Surgical History:  Procedure Laterality Date  . LIP REPAIR      Family History:  Family History  Problem Relation Age of Onset  . Diabetes Paternal Grandfather   . Stroke Paternal Grandmother   . Diabetes Paternal Grandmother   . Stroke Maternal Grandmother   . Stroke Maternal Grandfather      Social History:  reports that she has never smoked. She has never used smokeless tobacco. She reports that she does not drink alcohol or use drugs.  Allergies: Horseradish [armoracia rusticana ext (horseradish)]    Current Medications at time of admission:  Prior to Admission medications   Medication Sig Start Date End Date Taking? Authorizing Provider  Prenatal Vit-Fe  Fumarate-FA (PRENATAL MULTIVITAMIN) TABS tablet Take 1 tablet by mouth daily at 12 noon.   Yes [provider]   Review of Systems: Active FM No headache or vision changes or epigastric pain Dependent edema past 5 days with weight gain 10 pounds in 1 week URI symptoms x 1 week - sinus congestion  Physical Exam: VS: Blood pressure (!) 154/95, pulse 88, temperature 98.2 F (36.8 C), temperature source Oral, last menstrual period 11/15/2017, unknown if currently breastfeeding.  General: alert and oriented, appears calm and comfortable Heart: RRR Lungs: Clear lung fields Abdomen: Gravid, soft and non-tender, non-distended / uterus: gravid Extremities: 3+ edema  Genitalia / VE:  soft and posterior / 2cm / 60% / vtx -3  FHR reactive x 2 TOCO: occasional ctx  Assessment: [redacted] weeks gestation Di-DI twin gestation - concordant growth / vtx/vtx presentation  developing pre-eclampsia - new onset hypertension with proteinuria and edema desires vaginal birth with favorable Bishop score   Plan:  Admit  Expectant management unless severe range BP or onset of neuro symptoms BMZ course Serial labs with 24-hour urine collection MFM consult in AM  Dr Billy Coast consult / plan of care   Marlinda Mike CNM, MSN, [facility] 08/01/2018, 9:45 PM

## 2018-08-02 ENCOUNTER — Observation Stay (HOSPITAL_BASED_OUTPATIENT_CLINIC_OR_DEPARTMENT_OTHER): Payer: Medicaid Other

## 2018-08-02 ENCOUNTER — Observation Stay (HOSPITAL_COMMUNITY): Payer: Medicaid Other

## 2018-08-02 DIAGNOSIS — R197 Diarrhea, unspecified: Secondary | ICD-10-CM | POA: Diagnosis not present

## 2018-08-02 DIAGNOSIS — O26893 Other specified pregnancy related conditions, third trimester: Secondary | ICD-10-CM | POA: Diagnosis not present

## 2018-08-02 DIAGNOSIS — O1493 Unspecified pre-eclampsia, third trimester: Secondary | ICD-10-CM

## 2018-08-02 DIAGNOSIS — Z3A36 36 weeks gestation of pregnancy: Secondary | ICD-10-CM | POA: Diagnosis not present

## 2018-08-02 DIAGNOSIS — O30043 Twin pregnancy, dichorionic/diamniotic, third trimester: Secondary | ICD-10-CM | POA: Diagnosis not present

## 2018-08-02 LAB — CBC
HCT: 29.6 % — ABNORMAL LOW (ref 36.0–46.0)
Hemoglobin: 9.9 g/dL — ABNORMAL LOW (ref 12.0–15.0)
MCH: 29.9 pg (ref 26.0–34.0)
MCHC: 33.4 g/dL (ref 30.0–36.0)
MCV: 89.4 fL (ref 80.0–100.0)
Platelets: 170 10*3/uL (ref 150–400)
RBC: 3.31 MIL/uL — ABNORMAL LOW (ref 3.87–5.11)
RDW: 13.5 % (ref 11.5–15.5)
WBC: 6.3 10*3/uL (ref 4.0–10.5)
nRBC: 0 % (ref 0.0–0.2)

## 2018-08-02 LAB — COMPREHENSIVE METABOLIC PANEL
ALT: 16 U/L (ref 0–44)
AST: 17 U/L (ref 15–41)
Albumin: 2.2 g/dL — ABNORMAL LOW (ref 3.5–5.0)
Alkaline Phosphatase: 133 U/L — ABNORMAL HIGH (ref 38–126)
Anion gap: 8 (ref 5–15)
BUN: 11 mg/dL (ref 6–20)
CO2: 17 mmol/L — ABNORMAL LOW (ref 22–32)
Calcium: 8 mg/dL — ABNORMAL LOW (ref 8.9–10.3)
Chloride: 111 mmol/L (ref 98–111)
Creatinine, Ser: 0.69 mg/dL (ref 0.44–1.00)
GFR calc Af Amer: >60 mL/min (ref >60)
GFR calc non Af Amer: >60 mL/min (ref >60)
Glucose, Bld: 109 mg/dL — ABNORMAL HIGH (ref 70–99)
Potassium: 4.2 mmol/L (ref 3.5–5.1)
Sodium: 136 mmol/L (ref 135–145)
Total Bilirubin: 0.7 mg/dL (ref 0.3–1.2)
Total Protein: 5.1 g/dL — ABNORMAL LOW (ref 6.5–8.1)

## 2018-08-02 LAB — RPR: RPR Ser Ql: NONREACTIVE

## 2018-08-02 NOTE — Consult Note (Signed)
Maternal-Fetal Medicine Patient's Name: Jenny [name] MRN: [id number] Requesting Provider: Arlan Organ, CNM  I had the pleasure of seeing Jenny [name] today at the High-Risk Pregnancy Unit. She is a G4 P2012 at [redacted]w[redacted]d gestation with twins. She is admitted with the diagnosis of preeclampsia without severe features. Patient had swelling of feet yesterday and on your office evaluation, her blood pressures were increased. Subsequently, increased proteinuria confirmed the diagnosis of preeclampsia.  She has no symptoms of severe headache or visual disturbances or right upper quadrant pain or vaginal bleeding. She reports good fetal movements.  Since admission, she received first dose of betamethasone. Her prenatal course, has otherwise, been uneventful. Fetal growths has been concordant. Her pregnancy is dated by 18-week ultrasound.  Obstetric history is significant for 2 previous term vaginal deliveries. Her first pregnancy was complicated by preeclampsia and retained placenta that required manual removal. Her second pregnancy and delivery were uneventful. PMH: No history of hypertension or diabetes or any other chronic medical conditions. PSH: Nil of note. Allergies: NKDA. Medications: Prenatal vitamins, Zoloft. Social: Denies tobacco or drug or alcohol use. She has been married 6 years. Family: No history of venous thromboembolism. P/E: Patient is comfortably lying in bed; not in distress. BP: 135-156/78-94 mm Hg. Abd: Soft gravid uterus; no tenderness. Bilateral pedal edema present. NST reactive.  Labs: Hb 9.9, Hct 29.6, PLT 170, WBC 6.3, ALT 16, AST 17, creatinine 0.69. Protein/creatinine ratio: 0.76.  Ultrasound: Dichorionic-diamniotic twin pregnancy. Twin A: Maternal left, cephalic, anterior placenta. Fetal growth is appropriate for gestational age. Amniotic fluid is normal. Antenatal testing is reassuring. BPP 8/8.  Twin B: Maternal right, cephalic, posterior. Fetal growth is  appropriate for gestational age. Amniotic fluid is normal. Antenatal testing is reassuring. BPP 8/8. I counseled the patient on the following: Preeclampsia without severe features: -I explained the diagnosis including potential maternal complications of stroke, eclampsia, end-organ failure and placental abruption. -Complications are less-likely to occur in hospital setting. -Discussed timing of delivery. In twin pregnancy complicated by preeclampsia, delivery is recommended at 36 weeks. Patient wants to wait till 37 weeks to avoid prematurity. We discussed neonatal benefits versus maternal risks. I informed her that if she would like to wait till 37 weeks, inpatient management is ideal and is likely to prevent maternal and fetal complications. -Discussed mode of delivery. Patient is very keen on vaginal delivery, which is not contraindicated.  Recommendations: -Inpatient management to continue. -Delivery at 37 weeks (patient's preference). -Daily NST. -Earlier delivery is indicated if mother has symptoms of severe features of preeclampsia or lab abnormalities are seen.  Thank you for your consult. Please do not hesitate to contact me if you have any questions or concerns.  Consultation including face-to-face counseling: 40 min.

## 2018-08-02 NOTE — Plan of Care (Signed)

## 2018-08-02 NOTE — Progress Notes (Signed)
Patient ID: [name], female   DOB: Dec 19, 1985, 32 y.o.   MRN: [id number] Presented for fu of elevated BPs in office. Good FM NO headaches, visual changes or epigastric pain BP 136/78 (BP Location: Right Arm)   Pulse 71   Temp 98.7 F (37.1 C) (Oral)   Resp 17   Ht 5\' 5"  (1.651 m)   Wt 105.7 kg   LMP 11/15/2017 (Approximate)   SpO2 95%   BMI 38.77 kg/m   CBC    Component Value Date/Time   WBC 8.6 08/01/2018 1942   RBC 3.50 (L) 08/01/2018 1942   HGB 10.4 (L) 08/01/2018 1942   HCT 31.2 (L) 08/01/2018 1942   PLT 192 08/01/2018 1942   MCV 89.1 08/01/2018 1942   MCH 29.7 08/01/2018 1942   MCHC 33.3 08/01/2018 1942   RDW 13.6 08/01/2018 1942   LYMPHSABS 1.8 11/02/2012 1200   MONOABS 0.7 11/02/2012 1200   EOSABS 0.2 11/02/2012 1200   BASOSABS 0.0 11/02/2012 1200   CMP     Component Value Date/Time   NA 136 08/02/2018 0518   K 4.2 08/02/2018 0518   CL 111 08/02/2018 0518   CO2 17 (L) 08/02/2018 0518   GLUCOSE 109 (H) 08/02/2018 0518   BUN 11 08/02/2018 0518   CREATININE 0.69 08/02/2018 0518   CREATININE 0.60 01/24/2013 1614   CALCIUM 8.0 (L) 08/02/2018 0518   PROT 5.1 (L) 08/02/2018 0518   ALBUMIN 2.2 (L) 08/02/2018 0518   AST 17 08/02/2018 0518   ALT 16 08/02/2018 0518   ALKPHOS 133 (H) 08/02/2018 0518   BILITOT 0.7 08/02/2018 0518   GFRNONAA >60 08/02/2018 0518   GFRAA >60 08/02/2018 0518   See HP IMP: DIDI twins with concordant growth 36 w 2 d twin gestation PEC without severe features. Serial labs stable.  P: Will complete BMZ course Likely candidate for outpt expectant managment

## 2018-08-03 DIAGNOSIS — R12 Heartburn: Secondary | ICD-10-CM | POA: Diagnosis present

## 2018-08-03 DIAGNOSIS — O1404 Mild to moderate pre-eclampsia, complicating childbirth: Secondary | ICD-10-CM | POA: Diagnosis present

## 2018-08-03 DIAGNOSIS — Z6791 Unspecified blood type, Rh negative: Secondary | ICD-10-CM | POA: Diagnosis not present

## 2018-08-03 DIAGNOSIS — O9962 Diseases of the digestive system complicating childbirth: Secondary | ICD-10-CM | POA: Diagnosis present

## 2018-08-03 DIAGNOSIS — O26893 Other specified pregnancy related conditions, third trimester: Secondary | ICD-10-CM | POA: Diagnosis present

## 2018-08-03 DIAGNOSIS — O9081 Anemia of the puerperium: Secondary | ICD-10-CM | POA: Diagnosis not present

## 2018-08-03 DIAGNOSIS — Z3A36 36 weeks gestation of pregnancy: Secondary | ICD-10-CM | POA: Diagnosis not present

## 2018-08-03 DIAGNOSIS — O1490 Unspecified pre-eclampsia, unspecified trimester: Secondary | ICD-10-CM | POA: Diagnosis present

## 2018-08-03 DIAGNOSIS — D62 Acute posthemorrhagic anemia: Secondary | ICD-10-CM | POA: Diagnosis not present

## 2018-08-03 DIAGNOSIS — O30043 Twin pregnancy, dichorionic/diamniotic, third trimester: Secondary | ICD-10-CM | POA: Diagnosis present

## 2018-08-03 LAB — COMPREHENSIVE METABOLIC PANEL
ALBUMIN: 2.1 g/dL — AB (ref 3.5–5.0)
ALK PHOS: 115 U/L (ref 38–126)
ALT: 13 U/L (ref 0–44)
ANION GAP: 10 (ref 5–15)
AST: 15 U/L (ref 15–41)
BILIRUBIN TOTAL: 0.9 mg/dL (ref 0.3–1.2)
BUN: 17 mg/dL (ref 6–20)
CALCIUM: 8 mg/dL — AB (ref 8.9–10.3)
CO2: 16 mmol/L — ABNORMAL LOW (ref 22–32)
Chloride: 110 mmol/L (ref 98–111)
Creatinine, Ser: 0.82 mg/dL (ref 0.44–1.00)
GFR calc Af Amer: >60 mL/min (ref >60)
GFR calc non Af Amer: >60 mL/min (ref >60)
GLUCOSE: 107 mg/dL — AB (ref 70–99)
Potassium: 4.2 mmol/L (ref 3.5–5.1)
Sodium: 136 mmol/L (ref 135–145)
TOTAL PROTEIN: 4.9 g/dL — AB (ref 6.5–8.1)

## 2018-08-03 LAB — CBC
HEMATOCRIT: 29.5 % — AB (ref 36.0–46.0)
HEMOGLOBIN: 9.6 g/dL — AB (ref 12.0–15.0)
MCH: 29.3 pg (ref 26.0–34.0)
MCHC: 32.5 g/dL (ref 30.0–36.0)
MCV: 89.9 fL (ref 80.0–100.0)
NRBC: 0 % (ref 0.0–0.2)
Platelets: 196 10*3/uL (ref 150–400)
RBC: 3.28 MIL/uL — AB (ref 3.87–5.11)
RDW: 13.7 % (ref 11.5–15.5)
WBC: 8.1 10*3/uL (ref 4.0–10.5)

## 2018-08-03 LAB — PROTEIN, URINE, 24 HOUR
Collection Interval-UPROT: 24 hours
Protein, 24H Urine: 1783 mg/d — ABNORMAL HIGH (ref 50–100)
Protein, Urine: 115 mg/dL
Urine Total Volume-UPROT: 1550 mL

## 2018-08-03 MED ORDER — TETANUS-DIPHTH-ACELL PERTUSSIS 5-2.5-18.5 LF-MCG/0.5 IM SUSP
0.5000 mL | Freq: Once | INTRAMUSCULAR | Status: AC
  Administered 2018-08-03: 0.5 mL via INTRAMUSCULAR
  Filled 2018-08-03: qty 0.5

## 2018-08-03 NOTE — Progress Notes (Signed)
HD # 3  S:  Resting in bed, comfortable, denies HA/NV/RUQ pain/visual changes.  Occasional ctx and mild pressure.  Requesting TdaP vaccine.   O:  BP 118/73 - 145/74  Gen: AAO x 3, NAD Abd: gravid, NT GU: deferred Ext: +1 edema up to knees Neuro: no clonus bilat  FHT A cat 1  FHT B Cat1   A/P:  Stable PEC w/o severe features, FWB reassuring x 2.  Continue modified bed rest, NST daily, TED hose for comfort/edema Inpatient management until delivery  Plan IOL for DiDi twin/vaginal delivery at 37 wks  Neta Mends, MSN, CNM 08/03/2018, 8:02 PM

## 2018-08-03 NOTE — Progress Notes (Addendum)
ANTEPARTUM NOTE - Hospital Day 3 Di-DI twins 36.3 weeks with development of preeclampsia  Subjective: Sleeping - did not awake on rounds / will return to see later in day  Objective: Vital signs: VS: Blood pressure 124/79, pulse 79, temperature 98.1 F (36.7 C), temperature source Oral, resp. rate 17, height 5\' 5"  (1.651 m), weight 105.2 kg, last menstrual period 11/15/2017, SpO2 97 %, unknown if currently breastfeeding.   BP: 124/79 - 137/78 - 132/82 - 145/74 - 143/80 Weight 105.7 (10/23) and 105.2 (10/24)     Fetal Assessment:  FHR A  - category 1 FHR B -category 1 TOCO occasional ctx  Assessment: 36.3 weeks Di-Di twin gestation with cephalic presentation FHR x2 category 1 Preeclampsia without severe features - stable labs - hypertension improved with bedrest S/p BMZ course   Plan:  Continue inpatient management per MFM recommendation PIH labs every 3 days or sooner with change in status (10/25 and 10/28) Planned twin vaginal delivery - IOL planned for 37 weeks but sooner if change in status  Dr Billy Coast updated with patient status  Marlinda Mike CNM, MSN, [facility] 08/03/2018, 8:29 AM

## 2018-08-04 ENCOUNTER — Ambulatory Visit (HOSPITAL_COMMUNITY)
Admission: RE | Admit: 2018-08-04 | Discharge: 2018-08-04 | Disposition: A | Discharge status: Home / Self Care | Payer: Medicaid Other | Source: Ambulatory Visit | Attending: Obstetrics and Gynecology | Admitting: Obstetrics and Gynecology

## 2018-08-04 ENCOUNTER — Encounter (HOSPITAL_COMMUNITY): Payer: Self-pay

## 2018-08-04 LAB — CBC
HCT: 27.6 % — ABNORMAL LOW (ref 36.0–46.0)
Hemoglobin: 9.2 g/dL — ABNORMAL LOW (ref 12.0–15.0)
MCH: 29.8 pg (ref 26.0–34.0)
MCHC: 33.3 g/dL (ref 30.0–36.0)
MCV: 89.3 fL (ref 80.0–100.0)
Platelets: 192 10*3/uL (ref 150–400)
RBC: 3.09 MIL/uL — ABNORMAL LOW (ref 3.87–5.11)
RDW: 13.9 % (ref 11.5–15.5)
WBC: 8.3 10*3/uL (ref 4.0–10.5)
nRBC: 0 % (ref 0.0–0.2)

## 2018-08-04 LAB — COMPREHENSIVE METABOLIC PANEL
ALT: 13 U/L (ref 0–44)
AST: 14 U/L — ABNORMAL LOW (ref 15–41)
Albumin: 2.2 g/dL — ABNORMAL LOW (ref 3.5–5.0)
Alkaline Phosphatase: 119 U/L (ref 38–126)
Anion gap: 7 (ref 5–15)
BUN: 21 mg/dL — ABNORMAL HIGH (ref 6–20)
CO2: 18 mmol/L — ABNORMAL LOW (ref 22–32)
Calcium: 8.1 mg/dL — ABNORMAL LOW (ref 8.9–10.3)
Chloride: 112 mmol/L — ABNORMAL HIGH (ref 98–111)
Creatinine, Ser: 0.87 mg/dL (ref 0.44–1.00)
GFR calc Af Amer: >60 mL/min (ref >60)
GFR calc non Af Amer: >60 mL/min (ref >60)
Glucose, Bld: 97 mg/dL (ref 70–99)
Potassium: 4.1 mmol/L (ref 3.5–5.1)
Sodium: 137 mmol/L (ref 135–145)
Total Bilirubin: 0.8 mg/dL (ref 0.3–1.2)
Total Protein: 5 g/dL — ABNORMAL LOW (ref 6.5–8.1)

## 2018-08-04 LAB — URIC ACID: Uric Acid, Serum: 8.2 mg/dL — ABNORMAL HIGH (ref 2.5–7.1)

## 2018-08-04 MED ORDER — FAMOTIDINE 20 MG PO TABS
20.0000 mg | ORAL_TABLET | Freq: Two times a day (BID) | ORAL | Status: DC
  Administered 2018-08-04 – 2018-08-05 (×2): 20 mg via ORAL
  Filled 2018-08-04 (×4): qty 1

## 2018-08-04 MED ORDER — MAGNESIUM OXIDE 400 (241.3 MG) MG PO TABS
400.0000 mg | ORAL_TABLET | Freq: Every day | ORAL | Status: DC
  Administered 2018-08-04 – 2018-08-05 (×2): 400 mg via ORAL
  Filled 2018-08-04 (×4): qty 1

## 2018-08-04 NOTE — Plan of Care (Signed)
  Problem: Pain Managment: Goal: General experience of comfort will improve Outcome: Progressing   Problem: Education: Goal: Knowledge of disease or condition will improve Outcome: Progressing   Problem: Pain Management: Goal: Relief or control of pain will improve Outcome: Progressing   Problem: Education: Goal: Knowledge of disease or condition will improve Outcome: Progressing

## 2018-08-04 NOTE — Progress Notes (Signed)
ANTEPARTUM NOTE - Hospital Day 4 Di-DI twins 36.4 weeks with development of preeclampsia  S: Doing well overall, denies HA/NV/RUQ pain.  + heartburn, would like to try pepcid.  LE edema improved with TED hose wearing.  Good FM x 2, no VB. Reports increased pelvic pressure, no LOF.   O:  Vitals:   08/04/18 0450 08/04/18 0450 08/04/18 0816 08/04/18 1234  BP:  130/87 (!) 142/81 132/77  Pulse:  (!) 58 60 75  Resp:  _0 Temp:  98.3 F (36.8 C) (!) 97.5 F (36.4 C) 98.2 F (36.8 C)  TempSrc:  Oral Oral Oral  SpO2:  98% 97% 99%  Weight: 103.4 kg     Height:       CBC Latest Ref Rng & Units 08/04/2018 08/03/2018 08/02/2018  WBC 4.0 - 10.5 K/uL 8.3 8.1 6.3  Hemoglobin 12.0 - 15.0 g/dL 9.2(L) 9.6(L) 9.9(L)  Hematocrit 36.0 - 46.0 % 27.6(L) 29.5(L) 29.6(L)  Platelets 150 - 400 K/uL 192 196 170   Hepatic Function Latest Ref Rng & Units 08/04/2018 08/03/2018 08/02/2018  Total Protein 6.5 - 8.1 g/dL 5.0(L) 4.9(L) 5.1(L)  Albumin 3.5 - 5.0 g/dL 2.2(L) 2.1(L) 2.2(L)  AST 15 - 41 U/L 14(L) 15 17  ALT 0 - 44 U/L _1 Alk Phosphatase 38 - 126 U/L 119 115 133(H)  Total Bilirubin 0.3 - 1.2 mg/dL 0.8 0.9 0.7   24 hr TUP Volume 1783 cc Protein 115 gm  GBS neg 07/25/2018    FHT: "A" Baseline: 130 bpm, Variability: Good {> 6 bpm), Accelerations: Reactive and Decelerations: Absent  FHT: "B" Baseline: 135 bpm, Variability: Good {> 6 bpm), Accelerations: Reactive and Decelerations: Absent Toco: occasional ctx, mild SVE: deferred LE's +1 pedal edema Neuro: no clonus bilat  A/P- 32 y.o. admitted with pre-eclampsia w/o severe features BP remains labile to mild range, no neural s/sx Dating:  57w4dper sono (twin B) PNL Needed:  none FWB:  Category 1 x 2, s/p BMZ course completed 08/03/18 ROD: induced vaginal IOL planned at 37 wks or sooner with worsening sx  Continue inpatient care in interim per MFM recommendation Patient may shower and ambulate briefly outside x 1 daily for  mental well being.    DJuliene Pina CNM, MSN 08/04/2018, 12:43 PM

## 2018-08-05 NOTE — Progress Notes (Signed)
S:  Resting in bed, reports increased pressure. + FM, denies PEC s/sx.   O:  Vitals:   08/04/18 2356 08/05/18 0600 08/05/18 0625 08/05/18 0839  BP: 133/88  134/71 138/85  Pulse: 69  64 61  Resp: 18  17 16   Temp: 98.5 F (36.9 C)  98 F (36.7 C) 98.5 F (36.9 C)  TempSrc: Oral  Oral Oral  SpO2: 99%  98% 99%  Weight:  105.6 kg    Height:        FHT "A" - Category 1 FHT "B" - Category 1 Irregular ctx  SVE 2-3/60/-2, IBOW  Assessment: 36.5 weeks Di-Di twin gestation with cephalic presentation FHR x2 category 1 Preeclampsia without severe features - stable labs - hypertension improved with bedrest S/p BMZ course MFM consult done - inpatient management until delivery  Plan: Desires to proceed with delivery Transfer to [facility] at 2000 Cervical ripening tonight - FB/cytotec  Neta Mends, MSN, CNM 08/05/2018, 1:09 PM

## 2018-08-05 NOTE — Progress Notes (Signed)
IOL on hold for now d/t BS unit acuity, no RN available to manage patient.  Will proceed as soon as staff available.  Patient updated w/ POC.

## 2018-08-06 ENCOUNTER — Inpatient Hospital Stay (HOSPITAL_COMMUNITY): Payer: Medicaid Other | Admitting: Anesthesiology

## 2018-08-06 ENCOUNTER — Encounter (HOSPITAL_COMMUNITY): Payer: Self-pay | Admitting: *Deleted

## 2018-08-06 DIAGNOSIS — Z8759 Personal history of other complications of pregnancy, childbirth and the puerperium: Secondary | ICD-10-CM

## 2018-08-06 DIAGNOSIS — R58 Hemorrhage, not elsewhere classified: Secondary | ICD-10-CM

## 2018-08-06 DIAGNOSIS — Z3490 Encounter for supervision of normal pregnancy, unspecified, unspecified trimester: Secondary | ICD-10-CM | POA: Diagnosis not present

## 2018-08-06 LAB — CBC
HCT: 24.4 % — ABNORMAL LOW (ref 36.0–46.0)
HCT: 29.4 % — ABNORMAL LOW (ref 36.0–46.0)
HEMATOCRIT: 29.5 % — AB (ref 36.0–46.0)
HEMATOCRIT: 29.1 % — AB (ref 36.0–46.0)
HEMOGLOBIN: 9.4 g/dL — AB (ref 12.0–15.0)
Hemoglobin: 9.8 g/dL — ABNORMAL LOW (ref 12.0–15.0)
Hemoglobin: 8.1 g/dL — ABNORMAL LOW (ref 12.0–15.0)
Hemoglobin: 9.8 g/dL — ABNORMAL LOW (ref 12.0–15.0)
MCH: 29.8 pg (ref 26.0–34.0)
MCH: 29.2 pg (ref 26.0–34.0)
MCH: 29.9 pg (ref 26.0–34.0)
MCH: 29.1 pg (ref 26.0–34.0)
MCHC: 33.2 g/dL (ref 30.0–36.0)
MCHC: 32.3 g/dL (ref 30.0–36.0)
MCHC: 33.2 g/dL (ref 30.0–36.0)
MCHC: 33.3 g/dL (ref 30.0–36.0)
MCV: 89.7 fL (ref 80.0–100.0)
MCV: 90.4 fL (ref 80.0–100.0)
MCV: 90 fL (ref 80.0–100.0)
MCV: 87.2 fL (ref 80.0–100.0)
NRBC: 0.2 % (ref 0.0–0.2)
PLATELETS: 194 10*3/uL (ref 150–400)
PLATELETS: 191 10*3/uL (ref 150–400)
Platelets: 171 10*3/uL (ref 150–400)
Platelets: 200 10*3/uL (ref 150–400)
RBC: 3.29 MIL/uL — ABNORMAL LOW (ref 3.87–5.11)
RBC: 3.22 MIL/uL — ABNORMAL LOW (ref 3.87–5.11)
RBC: 2.71 MIL/uL — AB (ref 3.87–5.11)
RBC: 3.37 MIL/uL — AB (ref 3.87–5.11)
RDW: 14 % (ref 11.5–15.5)
RDW: 13.9 % (ref 11.5–15.5)
RDW: 14 % (ref 11.5–15.5)
RDW: 13.9 % (ref 11.5–15.5)
WBC: 9.2 10*3/uL (ref 4.0–10.5)
WBC: 13.3 10*3/uL — ABNORMAL HIGH (ref 4.0–10.5)
WBC: 15 10*3/uL — ABNORMAL HIGH (ref 4.0–10.5)
WBC: 15.3 10*3/uL — ABNORMAL HIGH (ref 4.0–10.5)
nRBC: 0 % (ref 0.0–0.2)
nRBC: 0 % (ref 0.0–0.2)
nRBC: 0 % (ref 0.0–0.2)

## 2018-08-06 LAB — SAVE SMEAR (SSMR)

## 2018-08-06 LAB — APTT: APTT: 26 s (ref 24–36)

## 2018-08-06 LAB — FIBRINOGEN: Fibrinogen: 288 mg/dL (ref 210–475)

## 2018-08-06 LAB — COMPREHENSIVE METABOLIC PANEL
ALK PHOS: 97 U/L (ref 38–126)
ALT: 12 U/L (ref 0–44)
ANION GAP: 8 (ref 5–15)
AST: 21 U/L (ref 15–41)
Albumin: 1.5 g/dL — ABNORMAL LOW (ref 3.5–5.0)
BILIRUBIN TOTAL: 0.4 mg/dL (ref 0.3–1.2)
BUN: 15 mg/dL (ref 6–20)
CALCIUM: 7.6 mg/dL — AB (ref 8.9–10.3)
CO2: 18 mmol/L — AB (ref 22–32)
CREATININE: 0.69 mg/dL (ref 0.44–1.00)
Chloride: 111 mmol/L (ref 98–111)
Glucose, Bld: 99 mg/dL (ref 70–99)
Potassium: 4.1 mmol/L (ref 3.5–5.1)
SODIUM: 137 mmol/L (ref 135–145)
TOTAL PROTEIN: 3.6 g/dL — AB (ref 6.5–8.1)

## 2018-08-06 LAB — PLATELET COUNT: Platelets: 194 10*3/uL (ref 150–400)

## 2018-08-06 LAB — SYPHILIS: RPR W/REFLEX TO RPR TITER AND TREPONEMAL ANTIBODIES, TRADITIONAL SCREENING AND DIAGNOSIS ALGORITHM: RPR Ser Ql: NONREACTIVE

## 2018-08-06 LAB — PROTIME-INR
INR: 1.11
Prothrombin Time: 14.2 seconds (ref 11.4–15.2)

## 2018-08-06 LAB — PREPARE RBC (CROSSMATCH)

## 2018-08-06 MED ORDER — LABETALOL HCL 5 MG/ML IV SOLN: 20.0000 mg | INTRAVENOUS | Status: DC | PRN

## 2018-08-06 MED ORDER — CEFAZOLIN SODIUM-DEXTROSE 2-4 GM/100ML-% IV SOLN
2.0000 g | Freq: Once | INTRAVENOUS | Status: AC
  Administered 2018-08-06: 2 g via INTRAVENOUS
  Filled 2018-08-06: qty 100

## 2018-08-06 MED ORDER — CYCLOBENZAPRINE HCL 5 MG PO TABS
5.0000 mg | ORAL_TABLET | Freq: Four times a day (QID) | ORAL | Status: DC | PRN
  Filled 2018-08-06: qty 2

## 2018-08-06 MED ORDER — SODIUM CHLORIDE 0.9% FLUSH: 3.0000 mL | Freq: Two times a day (BID) | INTRAVENOUS | Status: DC

## 2018-08-06 MED ORDER — NIFEDIPINE ER OSMOTIC RELEASE 30 MG PO TB24
30.0000 mg | ORAL_TABLET | Freq: Every day | ORAL | Status: DC
  Administered 2018-08-06 – 2018-08-09 (×4): 30 mg via ORAL
  Filled 2018-08-06 (×4): qty 1

## 2018-08-06 MED ORDER — LABETALOL HCL 5 MG/ML IV SOLN
20.0000 mg | Freq: Once | INTRAVENOUS | Status: AC
  Administered 2018-08-06: 20 mg via INTRAVENOUS
  Filled 2018-08-06: qty 4

## 2018-08-06 MED ORDER — OXYCODONE HCL 5 MG PO TABS: 5.0000 mg | ORAL_TABLET | ORAL | Status: DC | PRN

## 2018-08-06 MED ORDER — HYDROCHLOROTHIAZIDE 25 MG PO TABS
25.0000 mg | ORAL_TABLET | Freq: Every day | ORAL | Status: DC
  Administered 2018-08-07 – 2018-08-09 (×3): 25 mg via ORAL
  Filled 2018-08-06 (×3): qty 1

## 2018-08-06 MED ORDER — COCONUT OIL OIL: 1.0000 "application " | TOPICAL_OIL | Status: DC | PRN

## 2018-08-06 MED ORDER — SODIUM CHLORIDE 0.9% IV SOLUTION: Freq: Once | INTRAVENOUS | Status: DC

## 2018-08-06 MED ORDER — ACETAMINOPHEN 325 MG PO TABS
650.0000 mg | ORAL_TABLET | ORAL | Status: DC | PRN
  Administered 2018-08-06 – 2018-08-08 (×4): 650 mg via ORAL
  Filled 2018-08-06 (×4): qty 2

## 2018-08-06 MED ORDER — PHENYLEPHRINE 40 MCG/ML (10ML) SYRINGE FOR IV PUSH (FOR BLOOD PRESSURE SUPPORT)
80.0000 ug | PREFILLED_SYRINGE | INTRAVENOUS | Status: DC | PRN
  Filled 2018-08-06: qty 5

## 2018-08-06 MED ORDER — SIMETHICONE 80 MG PO CHEW: 80.0000 mg | CHEWABLE_TABLET | ORAL | Status: DC | PRN

## 2018-08-06 MED ORDER — OXYCODONE-ACETAMINOPHEN 5-325 MG PO TABS: 1.0000 | ORAL_TABLET | ORAL | Status: DC | PRN

## 2018-08-06 MED ORDER — SOD CITRATE-CITRIC ACID 500-334 MG/5ML PO SOLN
30.0000 mL | ORAL | Status: DC | PRN
  Filled 2018-08-06: qty 15

## 2018-08-06 MED ORDER — ONDANSETRON HCL 4 MG/2ML IJ SOLN
4.0000 mg | INTRAMUSCULAR | Status: DC | PRN
  Administered 2018-08-07: 4 mg via INTRAVENOUS
  Filled 2018-08-06: qty 2

## 2018-08-06 MED ORDER — OXYCODONE-ACETAMINOPHEN 5-325 MG PO TABS: 2.0000 | ORAL_TABLET | ORAL | Status: DC | PRN

## 2018-08-06 MED ORDER — ONDANSETRON HCL 4 MG PO TABS: 4.0000 mg | ORAL_TABLET | ORAL | Status: DC | PRN

## 2018-08-06 MED ORDER — TETANUS-DIPHTH-ACELL PERTUSSIS 5-2.5-18.5 LF-MCG/0.5 IM SUSP: 0.5000 mL | Freq: Once | INTRAMUSCULAR | Status: DC

## 2018-08-06 MED ORDER — OXYTOCIN BOLUS FROM INFUSION: 500.0000 mL | Freq: Once | INTRAVENOUS | Status: DC

## 2018-08-06 MED ORDER — OXYTOCIN BOLUS FROM INFUSION
500.0000 mL | Freq: Once | INTRAVENOUS | Status: AC
  Administered 2018-08-06: 500 mL via INTRAVENOUS

## 2018-08-06 MED ORDER — OXYTOCIN 40 UNITS IN LACTATED RINGERS INFUSION - SIMPLE MED: 999.0000 m[IU]/min | INTRAVENOUS | Status: DC

## 2018-08-06 MED ORDER — TERBUTALINE SULFATE 1 MG/ML IJ SOLN: 0.2500 mg | Freq: Once | INTRAMUSCULAR | Status: DC | PRN

## 2018-08-06 MED ORDER — LACTATED RINGERS IV SOLN: 500.0000 mL | INTRAVENOUS | Status: DC | PRN

## 2018-08-06 MED ORDER — MISOPROSTOL 200 MCG PO TABS: 600.0000 ug | ORAL_TABLET | Freq: Once | ORAL | Status: DC

## 2018-08-06 MED ORDER — CEFAZOLIN (ANCEF) 1 G IV SOLR: 2.0000 g | Freq: Once | INTRAVENOUS | Status: DC

## 2018-08-06 MED ORDER — CARBOPROST TROMETHAMINE 250 MCG/ML IM SOLN: 250.0000 ug | Freq: Once | INTRAMUSCULAR | Status: DC

## 2018-08-06 MED ORDER — FENTANYL 2.5 MCG/ML BUPIVACAINE 1/10 % EPIDURAL INFUSION (WH - ANES)
14.0000 mL/h | INTRAMUSCULAR | Status: DC | PRN
  Administered 2018-08-06: 14 mL/h via EPIDURAL
  Filled 2018-08-06: qty 100

## 2018-08-06 MED ORDER — CARBOPROST TROMETHAMINE 250 MCG/ML IM SOLN
INTRAMUSCULAR | Status: AC
  Administered 2018-08-06: 250 ug via INTRAMUSCULAR
  Filled 2018-08-06: qty 1

## 2018-08-06 MED ORDER — BENZOCAINE-MENTHOL 20-0.5 % EX AERO
1.0000 "application " | INHALATION_SPRAY | CUTANEOUS | Status: DC | PRN
  Administered 2018-08-06: 1 via TOPICAL
  Filled 2018-08-06: qty 56

## 2018-08-06 MED ORDER — CARBOPROST TROMETHAMINE 250 MCG/ML IM SOLN
250.0000 ug | Freq: Once | INTRAMUSCULAR | Status: AC
  Administered 2018-08-06: 250 ug via INTRAMUSCULAR

## 2018-08-06 MED ORDER — HYDRALAZINE HCL 20 MG/ML IJ SOLN: 10.0000 mg | INTRAMUSCULAR | Status: DC | PRN

## 2018-08-06 MED ORDER — LIDOCAINE HCL (PF) 1 % IJ SOLN
30.0000 mL | INTRAMUSCULAR | Status: DC | PRN
  Filled 2018-08-06: qty 30

## 2018-08-06 MED ORDER — DIBUCAINE 1 % RE OINT: 1.0000 "application " | TOPICAL_OINTMENT | RECTAL | Status: DC | PRN

## 2018-08-06 MED ORDER — EPHEDRINE 5 MG/ML INJ
10.0000 mg | INTRAVENOUS | Status: DC | PRN
  Filled 2018-08-06: qty 2

## 2018-08-06 MED ORDER — FENTANYL CITRATE (PF) 100 MCG/2ML IJ SOLN
100.0000 ug | Freq: Once | INTRAMUSCULAR | Status: AC
  Administered 2018-08-06: 100 ug via INTRAVENOUS

## 2018-08-06 MED ORDER — ACETAMINOPHEN 325 MG PO TABS: 650.0000 mg | ORAL_TABLET | ORAL | Status: DC | PRN

## 2018-08-06 MED ORDER — SENNOSIDES-DOCUSATE SODIUM 8.6-50 MG PO TABS: 2.0000 | ORAL_TABLET | ORAL | Status: DC

## 2018-08-06 MED ORDER — IBUPROFEN 600 MG PO TABS
600.0000 mg | ORAL_TABLET | Freq: Four times a day (QID) | ORAL | Status: DC
  Administered 2018-08-08 – 2018-08-09 (×6): 600 mg via ORAL
  Filled 2018-08-06 (×6): qty 1

## 2018-08-06 MED ORDER — WITCH HAZEL-GLYCERIN EX PADS: 1.0000 "application " | MEDICATED_PAD | CUTANEOUS | Status: DC | PRN

## 2018-08-06 MED ORDER — OXYTOCIN 40 UNITS IN LACTATED RINGERS INFUSION - SIMPLE MED
1.0000 m[IU]/min | INTRAVENOUS | Status: DC
  Administered 2018-08-06: 2 m[IU]/min via INTRAVENOUS
  Filled 2018-08-06: qty 1000

## 2018-08-06 MED ORDER — PRENATAL MULTIVITAMIN CH
1.0000 | ORAL_TABLET | Freq: Every day | ORAL | Status: DC
  Administered 2018-08-07 – 2018-08-09 (×3): 1 via ORAL
  Filled 2018-08-06 (×3): qty 1

## 2018-08-06 MED ORDER — OXYTOCIN 40 UNITS IN LACTATED RINGERS INFUSION - SIMPLE MED
2.5000 [IU]/h | INTRAVENOUS | Status: DC
  Administered 2018-08-06: 2.5 [IU]/h via INTRAVENOUS
  Filled 2018-08-06: qty 1000

## 2018-08-06 MED ORDER — SODIUM CHLORIDE 0.9 % IV SOLN: 250.0000 mL | INTRAVENOUS | Status: DC | PRN

## 2018-08-06 MED ORDER — MISOPROSTOL 200 MCG PO TABS
400.0000 ug | ORAL_TABLET | Freq: Once | ORAL | Status: AC
  Administered 2018-08-06: 400 ug via RECTAL

## 2018-08-06 MED ORDER — PHENYLEPHRINE 40 MCG/ML (10ML) SYRINGE FOR IV PUSH (FOR BLOOD PRESSURE SUPPORT)
80.0000 ug | PREFILLED_SYRINGE | INTRAVENOUS | Status: DC | PRN
  Filled 2018-08-06: qty 5
  Filled 2018-08-06: qty 10

## 2018-08-06 MED ORDER — ONDANSETRON HCL 4 MG/2ML IJ SOLN: 4.0000 mg | Freq: Four times a day (QID) | INTRAMUSCULAR | Status: DC | PRN

## 2018-08-06 MED ORDER — OXYTOCIN 40 UNITS IN LACTATED RINGERS INFUSION - SIMPLE MED
INTRAVENOUS | Status: AC
  Administered 2018-08-06: 19:00:00
  Filled 2018-08-06: qty 1000

## 2018-08-06 MED ORDER — LABETALOL HCL 5 MG/ML IV SOLN: 40.0000 mg | INTRAVENOUS | Status: DC | PRN

## 2018-08-06 MED ORDER — FENTANYL CITRATE (PF) 100 MCG/2ML IJ SOLN
INTRAMUSCULAR | Status: AC
  Administered 2018-08-06: 100 ug via INTRAVENOUS
  Filled 2018-08-06: qty 2

## 2018-08-06 MED ORDER — LACTATED RINGERS IV SOLN: 500.0000 mL | Freq: Once | INTRAVENOUS | Status: DC

## 2018-08-06 MED ORDER — DIPHENHYDRAMINE HCL 50 MG/ML IJ SOLN: 12.5000 mg | INTRAMUSCULAR | Status: DC | PRN

## 2018-08-06 MED ORDER — HYDROCHLOROTHIAZIDE 25 MG PO TABS: 25.0000 mg | ORAL_TABLET | Freq: Every day | ORAL | Status: DC

## 2018-08-06 MED ORDER — LACTATED RINGERS IV SOLN
INTRAVENOUS | Status: DC
  Administered 2018-08-06 (×2): via INTRAVENOUS

## 2018-08-06 MED ORDER — OXYCODONE HCL 5 MG PO TABS: 10.0000 mg | ORAL_TABLET | ORAL | Status: DC | PRN

## 2018-08-06 MED ORDER — MISOPROSTOL 200 MCG PO TABS
200.0000 ug | ORAL_TABLET | ORAL | Status: DC
  Administered 2018-08-06 – 2018-08-07 (×4): 200 ug via ORAL
  Filled 2018-08-06 (×4): qty 1

## 2018-08-06 MED ORDER — MISOPROSTOL 50MCG HALF TABLET
50.0000 ug | ORAL_TABLET | ORAL | Status: DC | PRN
  Filled 2018-08-06: qty 1

## 2018-08-06 MED ORDER — SODIUM CHLORIDE 0.9% FLUSH: 3.0000 mL | INTRAVENOUS | Status: DC | PRN

## 2018-08-06 MED ORDER — MISOPROSTOL 200 MCG PO TABS
ORAL_TABLET | ORAL | Status: AC
  Administered 2018-08-06: 200 ug via BUCCAL
  Filled 2018-08-06: qty 5

## 2018-08-06 MED ORDER — LABETALOL HCL 5 MG/ML IV SOLN: 80.0000 mg | INTRAVENOUS | Status: DC | PRN

## 2018-08-06 MED ORDER — DIPHENOXYLATE-ATROPINE 2.5-0.025 MG PO TABS
2.0000 | ORAL_TABLET | Freq: Four times a day (QID) | ORAL | Status: DC | PRN
  Administered 2018-08-06: 2 via ORAL
  Filled 2018-08-06: qty 2

## 2018-08-06 MED ORDER — LIDOCAINE HCL (PF) 1 % IJ SOLN
INTRAMUSCULAR | Status: DC | PRN
  Administered 2018-08-06: 13 mL via EPIDURAL

## 2018-08-06 MED ORDER — LIDOCAINE-EPINEPHRINE (PF) 2 %-1:200000 IJ SOLN
INTRAMUSCULAR | Status: DC | PRN
  Administered 2018-08-06 (×2): 5 mL via INTRADERMAL

## 2018-08-06 MED ORDER — OXYTOCIN 10 UNIT/ML IJ SOLN: 10.0000 [IU] | Freq: Once | INTRAMUSCULAR | Status: DC

## 2018-08-06 MED ORDER — MISOPROSTOL 200 MCG PO TABS
200.0000 ug | ORAL_TABLET | Freq: Once | ORAL | Status: AC
  Administered 2018-08-06: 200 ug via BUCCAL

## 2018-08-06 MED ORDER — DIPHENHYDRAMINE HCL 25 MG PO CAPS: 25.0000 mg | ORAL_CAPSULE | Freq: Four times a day (QID) | ORAL | Status: DC | PRN

## 2018-08-06 MED ORDER — ZOLPIDEM TARTRATE 5 MG PO TABS: 5.0000 mg | ORAL_TABLET | Freq: Every evening | ORAL | Status: DC | PRN

## 2018-08-06 MED ORDER — MISOPROSTOL 200 MCG PO TABS: 200.0000 ug | ORAL_TABLET | Freq: Once | ORAL | Status: DC

## 2018-08-06 MED ORDER — FENTANYL CITRATE (PF) 100 MCG/2ML IJ SOLN
100.0000 ug | Freq: Once | INTRAMUSCULAR | Status: AC
  Administered 2018-08-06: 100 ug via INTRAVENOUS
  Filled 2018-08-06: qty 2

## 2018-08-06 MED ORDER — TRANEXAMIC ACID-NACL 1000-0.7 MG/100ML-% IV SOLN
1000.0000 mg | Freq: Once | INTRAVENOUS | Status: AC
  Administered 2018-08-06: 1000 mg via INTRAVENOUS
  Filled 2018-08-06: qty 100

## 2018-08-06 NOTE — Lactation Note (Signed)
This note was copied from a baby's chart. Lactation Consultation Note  Patient Name: Jenny [name] [id number] Date: 08/06/2018   [facility] Initial Visit:  Checked with secretary at desk about visiting mother and was informed not to visit at this time.  Mother has had some complications and many hospital staff were in her room.  She was getting ready to receive her second unit of blood.  LC will return in the early a.m.ttomorrow.                  Yusef Lamp R Toshiro Hanken 08/06/2018, 7:24 PM

## 2018-08-06 NOTE — Progress Notes (Signed)
Hemabate 1 ampule given 1818 TXA 1mg  ordered and given  Dr. Amado Nash at bedside 1829 Stat CBC, CMP, DIC panel ordered  PRBC began 1835  3rd L Pitocin bolus infusing  [id number]  Fundus firm, bleeding stable patient to be transported to [facility] for telemetry monitoring.

## 2018-08-06 NOTE — Progress Notes (Signed)
S: Slept last few hours, feels well rested. Denies PEC s/sx.   Low dose Pitocin overnight, cervical balloon expelled after 3 hours in situ.   O: Vitals:   08/06/18 0700 08/06/18 0726 08/06/18 0731 08/06/18 0800  BP: (!) 167/96 (!) 148/88 (!) 155/81 (!) 159/83  Pulse: (!) 58 65 61 (!) 55  Resp: 18 18 18 18   Temp:   98.1 F (36.7 C)   TempSrc:   Oral   SpO2:      Weight:      Height:        Intake/Output Summary (Last 24 hours) at 08/06/2018 2956 Last data filed at 08/05/2018 2100 Gross per 24 hour  Intake -  Output 850 ml  Net -850 ml    FHT:   Twin A: 150/ variability mod/ accelerations present/ absentdecelerations Twin B: 155/ variability mod/ accelerations present/ absentdecelerations UC:   regular, every 3-4 minutes SVE:   Dilation: 5.5 Effacement (%): 80 Station: -2 Exam by:: Colon Flattery, CNM  Vertex, IBOW  A / P: DiDi twins at [redacted]w[redacted]d per 17 wks sono IOL 2/2 gest HTN. Vertex/vertex, Twin "A" on maternal Left, "B" on maternal R  Increase Pitocin titration to contractions Plan AROM when comfortable with epidural  HELLP labs stable, no neural s/sx, continue to monitor closely  Fetal Wellbeing:  Category I x 2 Pain Control:  Epidural planned in active labor GBS neg  Anticipated MOD:  NSVD  Neta Mends, CNM, MSN 08/06/2018, 8:06 AM

## 2018-08-06 NOTE — Progress Notes (Signed)
Patient ID: Jenny [name], female   DOB: [birth year], 32 y.o.   MRN: [id number]  Delivery note- twin B- vacuum assisted vaginal delivery  IOL for PEC, Di-Di twins. Twin A - SVD attended by CNM team.  Twin B high station at -4, clear amniotic fluid. LOT. UCs adequate. Tired hands and knees, lateral position, epidural redone due to severe back pain. Maternal exhaustion at initial exam.  So after redoing epidural, resting, she was reassessed.  Exam now at -3 but pt more rested. Reviewed Vacuum assisted vaginal delivery and agrees. Risks/ complications, failure, needing c/section, hemorrhage on scalp/ skull reviewed and NICU team called in.   Mushroom vacuum used, 2 applications and rested pressure in between. Controlled pull forward and then up to allow controlled head delivery. Baby transferred to mother's belly since was active with good cry and good tone.   Other details in CNM note.   V.Terria Deschepper  MD

## 2018-08-06 NOTE — Progress Notes (Signed)
Called at 1800 by RN on High Risk PP unit for c/o severe pain in hips and back.  Patient crying and unable to settle down, states feel like when the baby was coming and she needs to push. Uterus 4 FB above U. Vaginal exam done and Bakri balloon noted in vaginal canal, not in uterus. Bakri remove and uterus massaged firmly, expelled 800 cc in clots.  Patient notes relief of pain.  Uterus explored for several small clots removed, noted firming with continued massage.  Foley cath in place, minimal urine, tea color.  Bolus LR with Pitocin 20 units started.  Orders for repeat Hemabate 250 mcg  IM and start tranexamic acid 1 gm. Call to Dr. Amado Nash and requested MD come to bedside.  Blood transfusion ordered, 2 units PRBC's to be given consecutively and labs ordered - CBC, CMP and coag profile.  Uterus remained firm during this period of time, Dr. Amado Nash arrived at bedside at 1830 and resumed care.     Neta Mends, MSN, CNM 08/06/2018, 7:49 PM

## 2018-08-06 NOTE — Progress Notes (Signed)
S:  Ambulated to BR and reports successful BM. Denies feeling ctx. Attempt at AROM poorly tolerated. Will re-attempt after epidural.   O:  VS: BP (!) 163/93   Pulse (!) 59   Temp 98.1 F (36.7 C) (Oral)   Resp 18   Ht 5\' 5"  (1.651 m)   Wt 105.6 kg   LMP 11/15/2017 (Approximate)   SpO2 99%   BMI 38.73 kg/m         FHR :  Twin A baseline 150 / variability moderate / accelerations present / absent decelerations Twin B baseline 145 / variability moderate / accelerations present/ absent variable        Toco: contractions every 2-4.5 minutes / 30-50/ mild-mod        Cervix : Dilation: 5.5 Effacement (%): 80 Cervical Position: Middle Station: -2 Presentation: Vertex Exam by:: Maureen Ralphs, CNM student         Membranes: intact        Pitocin 10 mU/min A:  32 y.o. [redacted]w[redacted]d [id number] Mild pre-ecl    - no neuro S/S Early labor FHR category 1 x2  P:  Epidural for pain management AROM when comfortable Active management Anticipate NSVB  Roma Schanz , SNM 08/06/2018, 11:18 AM

## 2018-08-06 NOTE — Progress Notes (Signed)
S:  Traction applied and cervical balloon dislodged. Pt thinks "my water broke". Reports mild discomfort w/ ctx.   O:  VS: BP (!) 149/82   Pulse (!) 53   Temp 98.2 F (36.8 C) (Oral)   Resp 16   Ht 5\' 5"  (1.651 m)   Wt 105.6 kg   LMP 11/15/2017 (Approximate)   SpO2 99%   BMI 38.73 kg/m         FHR: Twin A: baseline 150 / variability mod / accelerations present / absent decelerations Twin B: baseline 155 / variability mod / accelerations present / absent decelerations        Toco: contractions every 3-4.52minutes / mild-mod        Cervix : Dilation: 4.5 Effacement (%): 80 Cervical Position: Middle Station: -2 Presentation: Vertex Exam by:: Maureen Ralphs CNM student         Membranes: Intact        Pitocin 4 mU/min  A/P:  32 y.o. [id number] [redacted]w[redacted]d Mild pre-eclampsia proteinuria - stable BP - no neuro S/S Cervical balloon expelled Early labor Cat I surveillance x2 Continue Pitocin at 4 mU/min Analgesia/Anesthesia PRN Anticipate NSVB AMTSL  Rhea Pink, SNM 08/06/2018 5:17 AM

## 2018-08-06 NOTE — Progress Notes (Signed)
CRNA in to redose pt's epidural. Pt c/o back pain.

## 2018-08-06 NOTE — Progress Notes (Signed)
Verbal order from Ivonne Andrew, CNM: notify her if bakari balloon draining more than 50 ml per hour. Was also told to keep foley in over-night.

## 2018-08-06 NOTE — Progress Notes (Signed)
Hemabate 250 mcg given

## 2018-08-06 NOTE — Progress Notes (Signed)
S:  Becoming comfortable w/ epidural. Spouse present  O:  VS: BP (!) 162/97   Pulse 65   Temp 98.5 F (36.9 C) (Oral)   Resp 18   Ht 5\' 5"  (1.651 m)   Wt 105.6 kg   LMP 11/15/2017 (Approximate)   SpO2 99%   BMI 38.73 kg/m         FHR : Twin A baseline 135 / variability mod / accelerations present / absent decelerations  Twin B baseline 140 / variability mod / accelerations present / absent decelerations        Toco: contractions every 3 minutes / mod        Cervix : Dilation: 6 Effacement (%): 90 Cervical Position: Middle Station: -1 Presentation: Vertex Exam by:: Colon Flattery, CNM Pitocin 10 mU/min        Membranes: AROM of Twin A, clear fluid A:  32 [id number] [redacted]w[redacted]d Active labor  FHR category 1  P:  Active management Pain management via epidural Anticipate NSVB     Rhea Pink, SNM  08/06/2018, 11:59 AM

## 2018-08-06 NOTE — Anesthesia Procedure Notes (Signed)
Epidural Patient location during procedure: OB Start time: 08/06/2018 11:22 AM End time: 08/06/2018 11:35 AM  Staffing Anesthesiologist: Lowella Curb, MD Performed: anesthesiologist   Preanesthetic Checklist Completed: patient identified, site marked, surgical consent, pre-op evaluation, timeout performed, IV checked, risks and benefits discussed and monitors and equipment checked  Epidural Patient position: sitting Prep: ChloraPrep Patient monitoring: heart rate, cardiac monitor, continuous pulse ox and blood pressure Approach: midline Location: L2-L3 Injection technique: LOR saline  Needle:  Needle type: Tuohy  Needle gauge: 17 G Needle length: 9 cm Needle insertion depth: 6 cm Catheter type: closed end flexible Catheter size: 20 Guage Catheter at skin depth: 10 cm Test dose: negative  Assessment Events: blood not aspirated, injection not painful, no injection resistance, negative IV test and no paresthesia  Additional Notes Reason for block:procedure for pain

## 2018-08-06 NOTE — Progress Notes (Signed)
S:  32 y.o.[redacted]w[redacted]d [id number] w/ spont Mercie Eon twin gest transferred to L&D for IOL for mild pre-ecl. Pt admitted on 08/01/18 for elevated BP and dependent edema. MFM consult completed and delivery at 37 wks recommended per pt's request or sooner for worsening of symptoms of pre-ecl. Hx of SVB. Denies HA, visual changes, and epigastric pain.   O:   VS: BP (!) 149/82   Pulse (!) 53   Temp 98.2 F (36.8 C) (Oral)   Resp 16   Ht 5\' 5"  (1.651 m)   Wt 105.6 kg   LMP 11/15/2017 (Approximate)   SpO2 99%   BMI 38.73 kg/m  FHR: Twin A: baseline 150 / variability mod / accelerations present / absent decelerations Twin B: baseline 155 / variability mod / accelerations present / absent decelerations Toco: irregular contractions  Cervix :  2/70%/-2       Membranes: Intact  CBC    Component Value Date/Time   WBC 9.2 08/06/2018 0209   RBC 3.29 (L) 08/06/2018 0209   HGB 9.8 (L) 08/06/2018 0209   HCT 29.5 (L) 08/06/2018 0209   PLT 171 08/06/2018 0209   MCV 89.7 08/06/2018 0209   MCH 29.8 08/06/2018 0209   MCHC 33.2 08/06/2018 0209   RDW 14.0 08/06/2018 0209   LYMPHSABS 1.8 11/02/2012 1200   MONOABS 0.7 11/02/2012 1200   EOSABS 0.2 11/02/2012 1200   BASOSABS 0.0 11/02/2012 1200   CMP     Component Value Date/Time   NA 137 08/04/2018 0441   K 4.1 08/04/2018 0441   CL 112 (H) 08/04/2018 0441   CO2 18 (L) 08/04/2018 0441   GLUCOSE 97 08/04/2018 0441   BUN 21 (H) 08/04/2018 0441   CREATININE 0.87 08/04/2018 0441   CREATININE 0.60 01/24/2013 1614   CALCIUM 8.1 (L) 08/04/2018 0441   PROT 5.0 (L) 08/04/2018 0441   ALBUMIN 2.2 (L) 08/04/2018 0441   AST 14 (L) 08/04/2018 0441   ALT 13 08/04/2018 0441   ALKPHOS 119 08/04/2018 0441   BILITOT 0.8 08/04/2018 0441   GFRNONAA >60 08/04/2018 0441   GFRAA >60 08/04/2018 0441    A: 32 y.o. Jenny [name] [redacted]w[redacted]d Mild pre-eclampsia proteinuria - stable BP Dependent edema -improving Mercie Eon twin gestation S/P BMZ course Cat 1 FHR surveillance x2   P:   Admit to L&D for IOL Routine orders Cervical balloon and low-dose Pitocin to max of 4 mU/min -placed w/o diff and inflated w/ 60 mL Active management Anticipate NSVD Epidural for possible Primary C/S for twin B   Dr. Juliene Pina Notified of admission and plan   Rhea Pink, [facility] 08/06/2018 5:15 AM

## 2018-08-06 NOTE — Progress Notes (Addendum)
S/P birth of Twin A, c/o extreme back pain. Attempt to push through to bring head done w/ strong urge to bear down, but not effective and remains high station. MD at bedside to evaluate for poss vac extraction. High cephalic. Suspect compound presentation vs direct OP. Epidural dosed and ineffective. Epidural replaced. Pain control improving and placed in lateral position for rest. BP in severe range, suspect sever pain. Will give IV antihypertensives per protocol. Cat I FHR surveillance. Will re-eval.   Rhea Pink, SNM 08/06/2018 3:10 PM

## 2018-08-06 NOTE — Progress Notes (Signed)
Pt c/o not being able to breathe. 02 at 10 liters by non rebreather face mask. 02 sat 100%.

## 2018-08-06 NOTE — Progress Notes (Addendum)
Postpartum Hemorrhage-   Called to see pt, postpartum hemorrhage following placenta delivery. I did twin B vacuum delivery and proceeded to attend another birth.  CNM at bedside, placenta delivery was uncomplicated but followed by [facility].  When I returned to the room, she has received Hemabate 250 mcg IM and Cytotec 600 mcg, while pitocin was still running and uterine massage was being done.  Fentanyl 100 mcg IV  I explored the uterus and removed clots from fundus. Lower segment atony noted. VS stable.   Bakri balloon placed at the fundus and inflated with 380 cc. She tolerated it well. Placement confirmed with sono 2 gm Ancef for prophylaxis.   Plan to monitor H/ H closed, transfuse if <8 Continue Cytotec every 6 hrs.  Plan reviewed with CNM, agrees.   Hilary Hertz MD

## 2018-08-06 NOTE — Anesthesia Preprocedure Evaluation (Signed)
Anesthesia Evaluation  Patient identified by MRN, date of birth, ID band Patient awake    Reviewed: Allergy & Precautions, H&P , NPO status , Patient's Chart, lab work & pertinent test results, reviewed documented beta blocker date and time   History of Anesthesia Complications Negative for: history of anesthetic complications  Airway Mallampati: III  TM Distance: >3 FB Neck ROM: full    Dental  (+) Teeth Intact   Pulmonary neg pulmonary ROS,    breath sounds clear to auscultation       Cardiovascular Hypertension: GHTN.  Rhythm:regular Rate:Normal     Neuro/Psych negative neurological ROS  negative psych ROS   GI/Hepatic negative GI ROS, Neg liver ROS,   Endo/Other  Obese BMI 35.4  Renal/GU negative Renal ROS  negative genitourinary   Musculoskeletal   Abdominal   Peds  Hematology negative hematology ROS (+)   Anesthesia Other Findings   Reproductive/Obstetrics (+) Pregnancy                             Anesthesia Physical  Anesthesia Plan  ASA: II  Anesthesia Plan: Epidural   Post-op Pain Management:    Induction:   PONV Risk Score and Plan:   Airway Management Planned:   Additional Equipment:   Intra-op Plan:   Post-operative Plan:   Informed Consent: I have reviewed the patients History and Physical, chart, labs and discussed the procedure including the risks, benefits and alternatives for the proposed anesthesia with the patient or authorized representative who has indicated his/her understanding and acceptance.     Plan Discussed with:   Anesthesia Plan Comments:         Anesthesia Quick Evaluation

## 2018-08-06 NOTE — Progress Notes (Signed)
Upon admission to unit, pt stating 10/10 pain, stating she felt pressure, feeling like she had to push. BP 175/122.  Ivonne Andrew, CNM notified. Orders received for labetalol protocol and flexeril 5-10 mg. CNM En route to department.  CNM at bedside at 1805. See other note for medication administrations.

## 2018-08-06 NOTE — Progress Notes (Addendum)
Called by  CNM that Bakri had been expelled  Arrived in room and pt pale, had just received iv pain medication and sleepy  Patient Vitals for the past 24 hrs:  BP Temp Temp src Pulse Resp SpO2  08/06/18 1918 (!) 151/96 98.8 F (37.1 C) Oral 63 (!) 22 99 %  08/06/18 1903 (!) 143/102 98.7 F (37.1 C) - 70 - -  08/06/18 1850 (!) 143/103 99.3 F (37.4 C) - 68 16 98 %  08/06/18 1835 (!) 143/101 98.7 F (37.1 C) - 70 16 99 %  08/06/18 1758 (!) 175/122 - - - - -  08/06/18 1700 (!) 154/97 - - 74 18 -  08/06/18 1650 (!) 157/102 - - 73 18 -  08/06/18 1645 (!) 151/101 - - 72 18 -  08/06/18 1640 (!) 135/105 - - 82 18 -  08/06/18 1635 (!) 147/99 - - 68 18 -  08/06/18 1631 132/71 - - 74 18 -  08/06/18 1630 (!) 153/92 - - 77 18 -  08/06/18 1621 (!) 152/100 98.1 F (36.7 C) Axillary 75 18 -  08/06/18 1619 (!) 144/95 - - 75 18 -  08/06/18 1611 (!) 157/126 - - (!) 139 18 -  08/06/18 1600 (!) 139/91 - - 66 18 -  08/06/18 1530 (!) 139/102 - - 75 18 -  08/06/18 1515 (!) 163/97 - - 71 20 -  08/06/18 1513 (!) 161/98 - - 76 20 -  08/06/18 1510 (!) 176/155 - - (!) 132 18 -  08/06/18 1505 (!) 172/96 - - 75 18 -  08/06/18 1500 (!) 171/135 - - 78 18 -  08/06/18 1455 (!) 155/113 - - 82 18 -  08/06/18 1450 (!) 164/104 - - 80 18 -  08/06/18 1446 (!) 160/90 - - 70 18 -  08/06/18 1440 (!) 153/83 98.8 F (37.1 C) Oral 73 20 -  08/06/18 1435 (!) 173/100 - - 84 (!) 22 98 %  08/06/18 1419 - - - - 20 -  08/06/18 1330 (!) 161/88 - - 68 18 -  08/06/18 1303 (!) 142/68 98.5 F (36.9 C) Axillary 66 18 -  08/06/18 1300 (!) 163/97 - - 65 18 -  08/06/18 1236 (!) 161/100 - - 77 18 -  08/06/18 1234 (!) 148/125 - - 71 18 -  08/06/18 1205 (!) 151/86 - - 74 18 -  08/06/18 1200 (!) 159/111 - - 78 18 -  08/06/18 1150 (!) 162/97 - - 65 18 -  08/06/18 1145 (!) 172/99 - - 62 18 -  08/06/18 1140 (!) 172/90 98.5 F (36.9 C) Oral 62 18 -  08/06/18 1135 (!) 177/97 - - 61 18 -  08/06/18 1130 (!) 153/97 - - 67 18 -   08/06/18 1128 (!) 170/93 - - 67 18 -  08/06/18 1105 (!) 163/93 - - (!) 59 18 -  08/06/18 1030 (!) 145/89 - - 64 18 -  08/06/18 1000 (!) 155/84 - - (!) 52 18 -  08/06/18 0930 (!) 150/89 - - (!) 55 18 -  08/06/18 0900 131/76 - - (!) 56 18 -  08/06/18 0830 (!) 150/86 - - (!) 55 18 -  08/06/18 0800 (!) 159/83 - - (!) 55 18 -  08/06/18 0731 (!) 155/81 98.1 F (36.7 C) Oral 61 18 -  08/06/18 0726 (!) 148/88 - - 65 18 -  08/06/18 0700 (!) 167/96 - - (!) 58 18 -  08/06/18 0630 (!) 153/95 - - 66 16 -  08/06/18 0600 (!) 159/90 - - 61 15 -  08/06/18 0530 (!) 158/97 - - (!) 58 16 -  08/06/18 0400 (!) 149/82 98.2 F (36.8 C) Oral (!) 53 16 -  08/06/18 0207 (!) 146/80 - - 65 16 -  08/06/18 0201 (!) 161/101 - - 67 - -  08/06/18 0025 (!) 158/97 - - 61 16 -  08/06/18 0019 - 98.3 F (36.8 C) Oral - - -  08/06/18 0002 (!) 149/93 98.7 F (37.1 C) - 60 17 99 %  08/05/18 2009 (!) 158/92 97.9 F (36.6 C) Oral (!) 58 18 100 %   A&ox3, drowsy but able to communicate well nml respirations No clots at perineum and bme performed - few small clots evacuated and LUS firm and fundus firm; patient given second hemabate and stat cbc drawn; 1 unit blood being given and transexamic acid being started; over 30 minutes fundus remained firm and no further clots expressed and good tone of LUS also; Will give total 60mU pitocin and continued close monitoring with fundal massage; plan bme 1 hr after expelled backri to ensure no further clots collecting in uterus  Plan repeat cbc in 3 hrs Transfuse 2 units prbc now and may need 3rd; follow closely Pt more comfortable s/p pain meds and monitor bps closely  - h/o mild pre-e; will follow bps as fluid replacemement Cbc/dic panel pending  Total face to face time 45 min

## 2018-08-07 ENCOUNTER — Encounter (HOSPITAL_COMMUNITY): Payer: Self-pay

## 2018-08-07 LAB — CBC
HEMATOCRIT: 25.5 % — AB (ref 36.0–46.0)
Hemoglobin: 8.7 g/dL — ABNORMAL LOW (ref 12.0–15.0)
MCH: 29.5 pg (ref 26.0–34.0)
MCHC: 34.1 g/dL (ref 30.0–36.0)
MCV: 86.4 fL (ref 80.0–100.0)
Platelets: 151 10*3/uL (ref 150–400)
RBC: 2.95 MIL/uL — ABNORMAL LOW (ref 3.87–5.11)
RDW: 14.3 % (ref 11.5–15.5)
WBC: 12.9 10*3/uL — AB (ref 4.0–10.5)
nRBC: 0 % (ref 0.0–0.2)

## 2018-08-07 MED ORDER — RHO D IMMUNE GLOBULIN 1500 UNIT/2ML IJ SOSY
300.0000 ug | PREFILLED_SYRINGE | Freq: Once | INTRAMUSCULAR | Status: AC
  Administered 2018-08-07: 300 ug via INTRAVENOUS
  Filled 2018-08-07: qty 2

## 2018-08-07 MED ORDER — POLYSACCHARIDE IRON COMPLEX 150 MG PO CAPS
150.0000 mg | ORAL_CAPSULE | Freq: Every day | ORAL | Status: DC
  Administered 2018-08-07 – 2018-08-09 (×3): 150 mg via ORAL
  Filled 2018-08-07 (×4): qty 1

## 2018-08-07 MED ORDER — MAGNESIUM OXIDE 400 (241.3 MG) MG PO TABS
400.0000 mg | ORAL_TABLET | Freq: Every day | ORAL | Status: DC
  Administered 2018-08-08 – 2018-08-09 (×2): 400 mg via ORAL
  Filled 2018-08-07 (×3): qty 1

## 2018-08-07 NOTE — Progress Notes (Signed)
PPD 1 SVD twin A and VAVD twins B / PPH due to atony  S:  Reports feeling really tired / no PIH symptoms other than edema             Tolerating po/ No nausea or vomiting             Bleeding is light             Pain controlled with Motrin mostly             Up ad lib / ambulatory / voiding QS  Newborn twin boys Breast and donor breastmilk  O: VS: BP 128/74 (BP Location: Right Arm)   Pulse 79   Temp 98.8 F (37.1 C) (Oral)   Resp 20   Ht 5\' 5"  (1.651 m)   Wt 105.6 kg   LMP 11/15/2017 (Approximate)   SpO2 97%   BMI 38.73 kg/m    BP: 128/74 - 132/84 - 138/95 - 144/95 - 137/98- 150/97  LABS:             Recent Labs    08/06/18 2201 08/07/18 0520  WBC 15.3* 12.9*  HGB 9.8* 8.7*  PLT 191 151               Blood type: --/--/B NEG (10/25 0441) / newborn x 2 RH positive - Rhogam ordered  Rubella: Immune (06/12 0000)                     I&O: Intake/Output      10/27 0701 - 10/28 0700 10/28 0701 - 10/29 0700   P.O. 710    I.V. (mL/kg) 1852.6 (17.5)    Blood 1000    Total Intake(mL/kg) 3562.6 (33.7)    Urine (mL/kg/hr) 1600 (0.6)    Blood 3486    Total Output 5086    Net -1523.4                     Physical Exam:             Alert and oriented X3, pale and fatigued  Lungs: Clear and unlabored  Heart: regular rate and rhythm / no mumurs  Abdomen: soft, non-tender, non-distended              Fundus: firm, non-tender, Ueven  Perineum: ice pack in place - moderate edema  Lochia: light  Extremities: 2+ edema, no calf pain or tenderness    A: PPD # 1 SVD twins             Mild preeclampsia - hypertension stable on Procardia 30 XL and HCTZ 25              ABL s/p Immediate PPH due to atony  Doing well - stable status  P: Routine post partum orders  Remove epidural cath this am / Dc telemetry / saline lock IV / remove foley ~noon after trial ambulation             Continue strict I&O / check daily weight today and tomorrow  Marlinda Mike CNM, MSN, [facility] 08/07/2018,  7:11 AM

## 2018-08-07 NOTE — Lactation Note (Signed)
This note was copied from a baby's chart. Lactation Consultation Note  Patient Name: Jenny [name] [id number] Date: 08/07/2018 Reason for consult: Initial assessment;Multiple gestation;Late-preterm 34-36.6wks Breastfeeding consultation services and support information given.  Discussed late preterm feeding behaviors.  Babies have been receiving donor milk feedings due to mom feeling weak post PPH.  Mom received 2 units of blood.  She is still feeling very sleepy and weak this morning.  She breastfed her 2 previous babies for 2 and 3 years.  Mom reports an abundant milk supply.  I recommended mom start pumping and hand expressing in addition to putting babies to breast.  Mom is unsure if she wants to start pumping.  Spoons in the room for hand expression and mom states she knows how to hand express.  Mom cannot stay awake so she may be more willing after she has rested.  Assisted with positioning baby B at breast.  After a few attempts he latched but mostly a sleepy feed with weak sucks.  Baby A latched well and more active at the breast.  Mom would like babies to avoid bottles for supplementation.  Both babies cup fed donor milk and tolerated well.  Instructed to feed with cues but at least every 3 hours.  Encouraged to call for assist/concerns.  Maternal Data Has patient been taught Hand Expression?: Yes Does the patient have breastfeeding experience prior to this delivery?: Yes  Feeding Feeding Type: Breast Fed  LATCH Score Latch: Repeated attempts needed to sustain latch, nipple held in mouth throughout feeding, stimulation needed to elicit sucking reflex.  Audible Swallowing: None  Type of Nipple: Everted at rest and after stimulation  Comfort (Breast/Nipple): Soft / non-tender  Hold (Positioning): No assistance needed to correctly position infant at breast.  LATCH Score: 7  Interventions    Lactation Tools Discussed/Used     Consult Status Consult Status:  Follow-up Date: 08/08/18 Follow-up type: In-patient    Huston Foley 08/07/2018, 11:11 AM

## 2018-08-08 LAB — BPAM RBC
BLOOD PRODUCT EXPIRATION DATE: 201911042359
BLOOD PRODUCT EXPIRATION DATE: 201911042359
BLOOD PRODUCT EXPIRATION DATE: 201911272359
BLOOD PRODUCT EXPIRATION DATE: 201911282359
Blood Product Expiration Date: 201911252359
Blood Product Expiration Date: 201911262359
ISSUE DATE / TIME: 201910271829
ISSUE DATE / TIME: 201910271900
UNIT TYPE AND RH: 1700
UNIT TYPE AND RH: 9500
UNIT TYPE AND RH: 9500
Unit Type and Rh: 1700
Unit Type and Rh: 1700
Unit Type and Rh: 1700

## 2018-08-08 LAB — RH IG WORKUP (INCLUDES ABO/RH)
ABO/RH(D): B NEG
Fetal Screen: NEGATIVE
GESTATIONAL AGE(WKS): 36
UNIT DIVISION: 0

## 2018-08-08 LAB — TYPE AND SCREEN
ABO/RH(D): B NEG
ANTIBODY SCREEN: POSITIVE
UNIT DIVISION: 0
UNIT DIVISION: 0
UNIT DIVISION: 0
UNIT DIVISION: 0
Unit division: 0
Unit division: 0

## 2018-08-08 MED ORDER — SODIUM CHLORIDE 0.9 % IV SOLN
500.0000 mg | Freq: Once | INTRAVENOUS | Status: AC
  Administered 2018-08-08: 500 mg via INTRAVENOUS
  Filled 2018-08-08: qty 2

## 2018-08-08 MED ORDER — CAFFEINE-SODIUM BENZOATE 125-125 MG/ML IJ SOLN: 1000.0000 mg | Freq: Once | INTRAMUSCULAR | Status: DC

## 2018-08-08 NOTE — Lactation Note (Signed)
This note was copied from a baby's chart. Lactation Consultation Note  Patient Name: Jenny [name] Date: 08/08/2018 Reason for consult: Follow-up assessment;Multiple gestation;Late-preterm 34-36.6wks Both babies are at a 8% weight loss.  Mom is putting babies to breast for brief times.  They continue to receive donor milk supplementation but not every 3 hours.  Babies cup feeding 5-12 mls.  Mom has not started pumping.  Recommended babies receive supplement of 20-30 mls every 3 hours.  Reviewed pump and encouraged her to pump and hand express every 3 hours.  Mom c/o a headache this morning.  Instructed to feed with cues and call for assist prn.  Maternal Data    Feeding Feeding Type: Donor Breast Milk  LATCH Score                   Interventions    Lactation Tools Discussed/Used     Consult Status Consult Status: Follow-up Date: 08/09/18 Follow-up type: In-patient    Huston Foley 08/08/2018, 8:47 AM

## 2018-08-08 NOTE — Plan of Care (Signed)
  Problem: Elimination: Goal: Will not experience complications related to bowel motility Outcome: Progressing   Problem: Elimination: Goal: Will not experience complications related to urinary retention Outcome: Progressing   Problem: Clinical Measurements: Goal: Ability to maintain clinical measurements within normal limits will improve Outcome: Progressing   Problem: Education: Goal: Knowledge of disease or condition will improve Outcome: Progressing   Problem: Education: Goal: Knowledge of the prescribed therapeutic regimen will improve Outcome: Progressing

## 2018-08-08 NOTE — Progress Notes (Addendum)
PPD # 2 SVD twin A and VAVD twins B / PPH due to atony    Jenny [name], Jenny [name] [[id number]]  Live born female Jenny [name] Birth Weight: 6 lb 4.9 oz (2860 g) APGAR: 8, 9  Newborn Delivery   Birth date/time:  08/06/2018 12:43:00 Delivery type:  Vaginal, Spontaneous      Jett, Kulzer [[id number]]  Live born female Jenny [name] Birth Weight: 6 lb 11.1 oz (3035 g) APGAR: 9, 9  Newborn Delivery   Birth date/time:  08/06/2018 15:28:00 Delivery type:  Vaginal, Vacuum (Extractor)     Delivering provider:    Tamecia, Mcdougald [[id number]]  Neta Mends   Eischeid, BoyB Madeliene [[id number]]  Arlan Organ C   circumcision planned outpatient  Feeding: breastfeeding and donor milk  Pain control at delivery:    Kerryann, Allaire [[id number]]  Epidural   Mieko, Kneebone [[id number]]  Epidural   S:  Reports feeling very tired, difficulty sleeping during night, reports bad HA every time she tried to sit up to breastfeed twins. HA improved with laying down and taking APAP.             Tolerating po/ No nausea or vomiting             Bleeding is light             Minimal cramping, taking NSAIDs.             Up ad lib / ambulatory / voiding without difficulties   O:  A & O x 3, in no apparent distress              VS:  Vitals:   08/07/18 2319 08/08/18 0537 08/08/18 0904 08/08/18 1214  BP: 133/75 120/80 (!) 133/92 (!) 114/57  Pulse: 75 76 74 78  Resp: 16 16 18 18   Temp: 98.2 F (36.8 C) 98.6 F (37 C) 98.7 F (37.1 C) 99 F (37.2 C)  TempSrc:   Oral Oral  SpO2: 96% 99% 96% 95%  Weight:  95.9 kg    Height:        LABS:  Recent Labs    08/06/18 2201 08/07/18 0520  WBC 15.3* 12.9*  HGB 9.8* 8.7*  HCT 29.4* 25.5*  PLT 191 151    Blood type: --/--/B NEG (10/28 0520)  Rubella: Immune (06/12 0000)   I&O: I/O last 3 completed shifts: In: 3910 [P.O.:2910; Blood:1000] Out: 8675 [Urine:8675]          Total I/O In: -  Out: 2200  [Urine:2200]   Abdomen: soft, non-tender, non-distended             Fundus: firm, non-tender, U-1  Perineum: no edema  Lochia: scant  Extremities: +1 pedal edema, no calf pain or tenderness   - ecchymosis bilateral antecubital d/t previous blood draws.   A/P: PPD # 2 32 y.o., [id number]   Principal Problem:   Postpartum care following vaginal twin delivery (10/27) Active Problems:    Mild preeclampsia improving   - normotensive with rare mild range lability, continue Procardia and HCTZ as established  - labs stable  - diuresing very well, total weight loss 10 lbs in last 24 hours  - postural HA with sitting, suspect spinal HA,  will request anesthesia evaluation for possible blood patch    SVD DiDi twins (10/27)   VAVD (Twin B) 10/27   Maternal Rh neg / twins B positive  - will give Rhogam prior to DC   Postpartum hemorrhage -  3400 cc ABL anemia superimposed on IDA  - started oral Fe and Mag ox, s/p blood transfusion x 2 PRBC  Newborns with 8% weight loss, will continue inpatient  - continue working towards breastfeeding with lactation support and donor milk   Neta Mends, MSN, CNM 08/08/2018, 12:57 PM

## 2018-08-09 MED ORDER — IBUPROFEN 600 MG PO TABS: 600.0000 mg | ORAL_TABLET | Freq: Four times a day (QID) | ORAL | 0 refills | Status: DC

## 2018-08-09 MED ORDER — BENZOCAINE-MENTHOL 20-0.5 % EX AERO: 1.0000 "application " | INHALATION_SPRAY | CUTANEOUS | Status: DC | PRN

## 2018-08-09 MED ORDER — POLYSACCHARIDE IRON COMPLEX 150 MG PO CAPS: 150.0000 mg | ORAL_CAPSULE | Freq: Every day | ORAL | Status: DC

## 2018-08-09 MED ORDER — MAGNESIUM OXIDE 400 (241.3 MG) MG PO TABS: 400.0000 mg | ORAL_TABLET | Freq: Every day | ORAL | Status: DC

## 2018-08-09 MED ORDER — HYDROCHLOROTHIAZIDE 25 MG PO TABS: 25.0000 mg | ORAL_TABLET | Freq: Every day | ORAL | 0 refills | Status: DC

## 2018-08-09 MED ORDER — NIFEDIPINE ER 30 MG PO TB24: 30.0000 mg | ORAL_TABLET | Freq: Two times a day (BID) | ORAL | 0 refills | Status: DC

## 2018-08-09 MED ORDER — COCONUT OIL OIL: 1.0000 "application " | TOPICAL_OIL | 0 refills | Status: DC | PRN

## 2018-08-09 NOTE — Anesthesia Postprocedure Evaluation (Signed)
Anesthesia Post Note  Patient: Jenny [name]  Procedure(s) Performed: AN AD HOC LABOR EPIDURAL     Patient location during evaluation: [facility] Anesthesia Type: Epidural Level of consciousness: awake and alert Pain management: pain level controlled Vital Signs Assessment: post-procedure vital signs reviewed and stable Respiratory status: spontaneous breathing, nonlabored ventilation and respiratory function stable Cardiovascular status: stable Postop Assessment: no headache, no backache, epidural receding, able to ambulate, adequate PO intake, no apparent nausea or vomiting and patient able to bend at knees Anesthetic complications: no    Last Vitals:  Vitals:   08/09/18 0524 08/09/18 0823  BP: 135/90 132/88  Pulse: 63 76  Resp: 20 20  Temp: 36.8 C 36.9 C  SpO2: 100% 100%    Last Pain:  Vitals:   08/09/18 0823  TempSrc: Oral  PainSc:    Pain Goal: Patients Stated Pain Goal: 3 (08/07/18 2120)               Laban Emperor

## 2018-08-09 NOTE — Addendum Note (Signed)
Addendum  created 08/09/18 0845 by Elgie Congo, CRNA   Charge Capture section accepted, Sign clinical note, Visit diagnoses modified

## 2018-08-09 NOTE — Discharge Summary (Signed)
OB Discharge Summary  Patient Name: Jenny [name] DOB: 08-15-1986 MRN: [id number]  Date of admission: 08/01/2018 Delivering provider:    Osie, Merkin [[id number]]  Neta Mends   Irion, Florence Anaily [[id number]]  MODY, Ellie Lunch C   Date of discharge: 08/09/2018  Admitting diagnosis: SWELLING Intrauterine pregnancy: [redacted]w[redacted]d     Secondary diagnosis:Principal Problem:   Postpartum care following vaginal twin delivery (10/27) Active Problems:   Mild preeclampsia, third trimester   SVD DiDi twins (10/27)   VAVD (Twin B) 10/27   Postpartum hemorrhage - 3400 cc  Additional problems: postural headache on postpartum day 1 and 2     Discharge diagnosis:  Patient Active Problem List   Diagnosis Date Noted  . Postpartum care following vaginal twin delivery (10/27) 08/07/2018  . SVD DiDi twins (10/27) 08/06/2018  . VAVD (Twin B) 10/27 08/06/2018  . Postpartum hemorrhage - 3400 cc 08/06/2018  . Mild preeclampsia, third trimester 08/01/2018  . Rh negative status during pregnancy in second trimester, antepartum 04/24/2016                                                                  Post partum procedures:blood transfusion  Augmentation: AROM, Pitocin and Foley Balloon Pain control:    Karmah, Potocki [[id number]]  Epidural   Darlinda, Bellows Anniece [[id number]]  Epidural  Laceration:   Yanci, Bachtell [[id number]]  None   Halo, Shevlin [[id number]]     Episiotomy:   Kymber, Kosar [[id number]]  None   Carynn, Felling [[id number]]  None  Complications: Hemorrhage>1072mL  Hospital course:  Induction of Labor With Vaginal Delivery   32 y.o. yo [id number] at [redacted]w[redacted]d was admitted to the hospital 08/01/2018 for elevated blood pressure and generalized edema. She was noted to have sustained blood pressure in mild range, HELLP labs were grossly normal; perinatology consult was obtained with  recommendation for steroids course for fetal lung maturation and inpatient management until delivery at [redacted] weeks gestation.  Patient requested to be delivered by [redacted]w[redacted]d. Cervical ripening was initiated with cervical balloon and low dose Pitocin on 08/05/2018.  Indication for induction: Gestational hypertension and DiDi twin gestation.   Patient had a labor course as follows: Membrane Rupture Time/Date:    Kamirah, Shugrue [[id number]]  11:07 AM   Ahnya, Akre Bertrice [[id number]]  1:13 PM ,   Zavannah, Deblois [[id number]]  08/06/2018   Varshini, Arrants Sariah [[id number]]  08/06/2018   Intrapartum Procedures: Episiotomy:    Herschell Dimes [[id number]]  None [1]   Krista, Godsil Velia [[id number]]  None [1]                                         Lacerations:     Tammatha, Cobb [[id number]]  None [1]   Coral, Timme [[id number]]     Patient had delivery of a Viable infant.  Information for the patient's newborn:  Sari, Cogan [[id number]]  Delivery Method: Vaginal, Spontaneous(Filed from Delivery Summary) Information for the patient's newborn:  Avnoor, Koury [[id number]]  Delivery Method: Vaginal, Vacuum (Extractor)(Filed from Delivery Summary)     Shamel, Galyean [[id number]]  08/06/2018   Romona, Murdy [[id number]]  08/06/2018 Pain management was done with epidural placement in active labor, not effective, was replaced with second epidural after the birth of twin A with minimal relief. Severe range blood pressures noted during 2nd stage of labor, managed with intravenous labetalol per protocol. Details of delivery can be found in separate delivery note.   Patient had a postpartum course complicated by postpartum hemorrhage of 3400 cc managed with multiple uterotonics - Hemabate, Pitocin, Cytotec - and Bakri balloon briefly - was expelled after 2 hours in situ. Blood transfusion with 2 units  packed red cells was given for severe anemia and fluid volume replacement. Hemoglobin level normalized at 8.7 on postpartum day 1. Patient was started on Procardia 30XL and hydrochlorothiazide 25 mg postpartum, liberal diuresing for the next 3 days, with total weight loss in 48 hours of 15 lbs.  Patient complained of severe headache with sitting on postpartum day 2, she was treated with caffeine drip after anesthesia consult, with good effect. Blood pressure range remains in mild range, no neural symptoms on postpartum day 3. Patient is discharged home 08/09/18.  Physical exam  Vitals:   08/08/18 2315 08/09/18 0524 08/09/18 0600 08/09/18 0823  BP: (!) 142/93 135/90  132/88  Pulse: 86 63  76  Resp: 17 20  20   Temp: 98.4 F (36.9 C) 98.2 F (36.8 C)  98.4 F (36.9 C)  TempSrc: Oral Oral  Oral  SpO2: 98% 100%  100%  Weight:   92.5 kg   Height:       General: alert, cooperative and no distress Lochia: appropriate Uterine Fundus: firm Incision: N/A DVT Evaluation: No cords or calf tenderness. No significant calf/ankle edema. Labs: Lab Results  Component Value Date   WBC 12.9 (H) 08/07/2018   HGB 8.7 (L) 08/07/2018   HCT 25.5 (L) 08/07/2018   MCV 86.4 08/07/2018   PLT 151 08/07/2018   CMP Latest Ref Rng & Units 08/06/2018  Glucose 70 - 99 mg/dL 99  BUN 6 - 20 mg/dL 15  Creatinine 4.09 - 8.11 mg/dL 9.14  Sodium 782 - 956 mmol/L 137  Potassium 3.5 - 5.1 mmol/L 4.1  Chloride 98 - 111 mmol/L 111  CO2 22 - 32 mmol/L 18(L)  Calcium 8.9 - 10.3 mg/dL 7.6(L)  Total Protein 6.5 - 8.1 g/dL 2.1(H)  Total Bilirubin 0.3 - 1.2 mg/dL 0.4  Alkaline Phos 38 - 126 U/L 97  AST 15 - 41 U/L 21  ALT 0 - 44 U/L 12    Discharge instruction: per After Visit Summary and "Baby and Me Booklet".  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   benzocaine-Menthol 20-0.5 % Aero Commonly known as:  DERMOPLAST Apply 1 application topically as needed for irritation (perineal discomfort).   coconut oil Oil Apply 1 application topically as needed.   hydrochlorothiazide 25 MG tablet Commonly known as:  HYDRODIURIL Take 1 tablet (25 mg total) by mouth daily at 6 (six) AM for 3 doses. Start taking on:  08/10/2018   ibuprofen 600 MG tablet Commonly known as:  ADVIL,MOTRIN Take 1 tablet (600 mg total) by mouth every 6 (six) hours.   iron polysaccharides 150 MG capsule Commonly known  as:  NIFEREX Take 1 capsule (150 mg total) by mouth daily.   magnesium oxide 400 (241.3 Mg) MG tablet Commonly known as:  MAG-OX Take 1 tablet (400 mg total) by mouth daily.   NIFEdipine 30 MG 24 hr tablet Commonly known as:  ADALAT CC Take 1 tablet (30 mg total) by mouth 2 (two) times daily.   OVER THE COUNTER MEDICATION Take 2 tablets by mouth daily.   prenatal multivitamin Tabs tablet Take 1 tablet by mouth daily at 12 noon.            Discharge Care Instructions  (From admission, onward)         Start     Ordered   08/09/18 0000  Discharge wound care:    Comments:  Sitz baths 2 times /day with warm water x 1 week   08/09/18 1017          Diet: routine diet  Activity: Advance as tolerated. Pelvic rest for 6 weeks.   Postpartum contraception: will discuss at postpartum visit  Newborn Data:   Julienne, Vogler [[id number]]  Live born female Jimmey Ralph Birth Weight: 6 lb 4.9 oz (2860 g) APGAR: 8, 9  Newborn Delivery   Birth date/time:  08/06/2018 12:43:00 Delivery type:  Vaginal, Spontaneous      Aubrey, Blackard [[id number]]  Live born female Theron Arista Birth Weight: 6 lb 11.1 oz (3035 g) APGAR: 9, 9  Newborn Delivery   Birth date/time:  08/06/2018 15:28:00 Delivery type:  Vaginal, Vacuum (Extractor)    Baby Feeding:  Breast and and donor milk Disposition: may continue inpatient for Twin B due to continued weight loss   Delivery Report:  Review the Delivery Report for details.    Follow up: Follow-up Information    Neta Mends, CNM. Schedule an appointment as soon as possible for a visit in 1 week(s).   Specialty:  Obstetrics and Gynecology Why:  For BP check and circumcision of twin boys Contact information: [address] [place] [address] [phone]             Signed: Neta Mends, CNM, MSN 08/09/2018, 10:21 AM

## 2018-08-09 NOTE — Discharge Instructions (Signed)
For swelling: nettle and dandelion tinctures (follw directions on bottles), maintain hydration 8-10 cups water / day minimal (can find at health food stores - [employer], [employer], [employer]) °Add daily alfalfa 2000 mg tabs for iron absorption and boosting milk production °

## 2018-08-09 NOTE — Anesthesia Postprocedure Evaluation (Signed)
Anesthesia Post Note  Patient: Jenny [name]  Procedure(s) Performed: AN AD HOC LABOR EPIDURAL     Patient location during evaluation: Mother Baby Anesthesia Type: Epidural Level of consciousness: awake and alert Pain management: pain level controlled Vital Signs Assessment: post-procedure vital signs reviewed and stable Respiratory status: spontaneous breathing, nonlabored ventilation and respiratory function stable Cardiovascular status: stable Postop Assessment: no headache, no backache and epidural receding Anesthetic complications: no    Last Vitals:  Vitals:   08/08/18 2315 08/09/18 0524  BP: (!) 142/93 135/90  Pulse: 86 63  Resp: 17 20  Temp: 36.9 C 36.8 C  SpO2: 98% 100%    Last Pain:  Vitals:   08/09/18 0524  TempSrc: Oral  PainSc:    Pain Goal: Patients Stated Pain Goal: 3 (08/07/18 2120)               Lowella Curb

## 2018-08-09 NOTE — Progress Notes (Addendum)
PPD # 3 SVD twin A and VAVD twins B / PPH due to atony    [name], [name] [[id number]]  Live born female Jimmey Ralph Birth Weight: 6 lb 4.9 oz (2860 g) APGAR: 8, 9  Newborn Delivery   Birth date/time:  08/06/2018 12:43:00 Delivery type:  Vaginal, Spontaneous      [name], [name] [[id number]]  Live born female Theron Arista Birth Weight: 6 lb 11.1 oz (3035 g) APGAR: 9, 9  Newborn Delivery   Birth date/time:  08/06/2018 15:28:00 Delivery type:  Vaginal, Vacuum (Extractor)     Delivering provider:    Shakendra, Griffeth [[id number]]  Neta Mends   Worrell, BoyB Allexus [[id number]]  Arlan Organ C   circumcision planned outpatient  Feeding: breast and donor milk  Pain control at delivery:    Courtnee, Myer [[id number]]  Epidural   Miosotis, Wetsel [[id number]]  Epidural   S:  Reports feeling much better this am, headache gone after anesthesia consult and caffeine drip yesterday afternoon.  Pain controlled with NSAID, voiding freely, no N/V. Has been pumping and latching babies to breast as well as cup feeding donor milk.   O:  A & O x 3, in no apparent distress              VS:  Vitals:   08/08/18 2315 08/09/18 0524 08/09/18 0600 08/09/18 0823  BP: (!) 142/93 135/90  132/88  Pulse: 86 63  76  Resp: 17 20  20   Temp: 98.4 F (36.9 C) 98.2 F (36.8 C)  98.4 F (36.9 C)  TempSrc: Oral Oral  Oral  SpO2: 98% 100%  100%  Weight:   92.5 kg   Height:       Total weight loss since 08/07/18 = 15 lbs   Blood type: --/--/B NEG (10/28 0520) / Rhogam given  Rubella: Immune (06/12 0000)   I&O: I/O last 3 completed shifts: In: 2160 [P.O.:2160] Out: 8750 [Urine:8750]          No intake/output data recorded.    Abdomen: soft, non-tender, non-distended             Fundus: firm, non-tender, U-1  Perineum: intact  Lochia: small  Extremities: trace pedal edema, no calf pain or tenderness    A/P: PPD # 3 32 y.o., [id number]   Principal  Problem:   Postpartum care following vaginal twin delivery (10/27) Active Problems:   Mild preeclampsia, third trimester   SVD DiDi twins (10/27)   VAVD (Twin B) 10/27   Postpartum hemorrhage - 3400 cc   - BP remains in mild range, will increase Procardia 30 XL to BID, continue HCTZ for another 3 days after discharge - continue oral Fe and Mag ox supplement 4-6 wks PP, add daily alfalfa 2000 mg - help with Iron absorption and increase milk supply - TDaP UTD, declines flu vaccine  - DC home or to boarding status if twin B remains inpatient (weight loss) - F/U at [facility] in 1 week for BP check and twins circ  POC in consult w/ Dr. Alvin Critchley, MSN, CNM 08/09/2018, 9:56 AM

## 2018-08-09 NOTE — Lactation Note (Signed)
This note was copied from a baby's chart. Lactation Consultation Note: Experienced BF mom reports babies have just finished nursing for 15 min. Reports they are latching on well but her milk has not come in yet. Is able to hand express drops of Colostrum. Reports she pumped 3 times yesterday and only obtained drops. Is cup feeding donor breast milk after nursing. Baby B continues to lose weight. Baby A is gaining. Encouraged to feed more volume. No questions at present. Has Medela pump for home. Encouraged to continue pumping 4 or more times/ day to stimulate milk supply.Reviewed our phone number, OP appointments and BFSG as resources for support after DC. To call prn  Patient Name: Jenny [name] Jenny [name] Date: 08/09/2018 Reason for consult: Follow-up assessment;Late-preterm 34-36.6wks;Multiple gestation   Maternal Data Has patient been taught Hand Expression?: Yes Does the patient have breastfeeding experience prior to this delivery?: Yes  Feeding Feeding Type: Donor Breast Milk  LATCH Score                   Interventions    Lactation Tools Discussed/Used WIC Program: No   Consult Status Consult Status: Complete    Pamelia Hoit 08/09/2018, 8:43 AM

## 2018-09-05 ENCOUNTER — Encounter (HOSPITAL_BASED_OUTPATIENT_CLINIC_OR_DEPARTMENT_OTHER): Payer: Self-pay | Admitting: *Deleted

## 2018-09-05 ENCOUNTER — Emergency Department (HOSPITAL_BASED_OUTPATIENT_CLINIC_OR_DEPARTMENT_OTHER)
Admission: EM | Admit: 2018-09-05 | Discharge: 2018-09-05 | Disposition: A | Discharge status: Home / Self Care | Payer: Medicaid Other | Attending: Emergency Medicine | Admitting: Emergency Medicine

## 2018-09-05 ENCOUNTER — Other Ambulatory Visit: Payer: Self-pay

## 2018-09-05 DIAGNOSIS — Y999 Unspecified external cause status: Secondary | ICD-10-CM | POA: Diagnosis not present

## 2018-09-05 DIAGNOSIS — S41111A Laceration without foreign body of right upper arm, initial encounter: Secondary | ICD-10-CM

## 2018-09-05 DIAGNOSIS — W25XXXA Contact with sharp glass, initial encounter: Secondary | ICD-10-CM | POA: Diagnosis not present

## 2018-09-05 DIAGNOSIS — Y929 Unspecified place or not applicable: Secondary | ICD-10-CM | POA: Diagnosis not present

## 2018-09-05 DIAGNOSIS — S51811A Laceration without foreign body of right forearm, initial encounter: Secondary | ICD-10-CM | POA: Diagnosis present

## 2018-09-05 DIAGNOSIS — Y939 Activity, unspecified: Secondary | ICD-10-CM | POA: Insufficient documentation

## 2018-09-05 NOTE — ED Triage Notes (Signed)
Pt c/o right arm puncture from glass x 2 hrs ago

## 2018-09-05 NOTE — ED Notes (Signed)
ED Provider at bedside. 

## 2018-09-05 NOTE — ED Provider Notes (Signed)
Emergency Department Provider Note   I have reviewed the triage vital signs and the nursing notes.   HISTORY  Chief Complaint Laceration   HPI Jenny [name] is a 32 y.o. female to the emergency department for evaluation after cutting her right forearm on a piece of glass.  She was reaching down when a area of broken glass cut the right forearm.  She notes initial oozing blood that was stopped with direct pressure.  She has been having pain over the dorsal aspect of the right hand, remote from the cut.  Pain is worse with movement although has improved over the last 2 hours.  No numbness or tingling.  The patient went to 3 different urgent care facilities and was ultimately referred to the emergency department for further evaluation.  No additional areas of injury. Tetanus was updated 2 years prior.   Past Medical History:  Diagnosis Date  . Shortness of breath    when running  . Urinary tract infection   . Vaginal Pap smear, abnormal     Patient Active Problem List   Diagnosis Date Noted  . Postpartum care following vaginal twin delivery (10/27) 08/07/2018  . SVD DiDi twins (10/27) 08/06/2018  . VAVD (Twin B) 10/27 08/06/2018  . Postpartum hemorrhage - 3400 cc 08/06/2018  . Mild preeclampsia, third trimester 08/01/2018  . Rh negative status during pregnancy in second trimester, antepartum 04/24/2016    Past Surgical History:  Procedure Laterality Date  . 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 Diabetes Paternal Grandfather   . Stroke Paternal Grandmother   . Diabetes Paternal Grandmother   . Stroke Maternal Grandmother   . Stroke Maternal Grandfather     Social History Social History   Tobacco Use  . Smoking status: Never Smoker  . Smokeless tobacco: Never Used  Substance Use Topics  . Alcohol use: No  . Drug use: No    Review of Systems  Constitutional: No  fever/chills Eyes: No visual changes. ENT: No sore throat. Cardiovascular: Denies chest pain. Respiratory: Denies shortness of breath. Gastrointestinal: No abdominal pain.  No nausea, no vomiting.  No diarrhea.  No constipation. Genitourinary: Negative for dysuria. Musculoskeletal: Negative for back pain. Skin: forearm laceration and pain.  Neurological: Negative for headaches, focal weakness or numbness.  10-point ROS otherwise negative.  ____________________________________________   PHYSICAL EXAM:  VITAL SIGNS: BP: 128/79 Pulse: 86 Temp: 98.7 F SpO2: 98%  Constitutional: Alert and oriented. Well appearing and in no acute distress. Eyes: Conjunctivae are normal.  Head: Atraumatic. Nose: No congestion/rhinnorhea. Mouth/Throat: Mucous membranes are moist.  Neck: No stridor.  Cardiovascular: Good peripheral circulation.  Respiratory: Normal respiratory effort.  Gastrointestinal: No distention.  Musculoskeletal: No lower extremity tenderness nor edema. Normal flexor and extensor exam of the right forearm.  Neurologic:  Normal speech and language. Normal neuro exam in the right arm.  Skin:  Skin is warm, dry and intact. 2 mm laceration to the right, ventral forearm laceration. Wound is well approximated with no visible glass.   ____________________________________________  RADIOLOGY  None ____________________________________________   PROCEDURES  Procedure(s) performed:   Procedures  None ____________________________________________   INITIAL IMPRESSION / ASSESSMENT AND PLAN / ED COURSE  Pertinent labs & imaging results that were available during my care of the patient were reviewed by me and considered in my medical decision making (see chart for details).  Patient presents to the  emergency department with a very small puncture to the right forearm.  The wound is very small with mild tenderness.  No visible glass in the wound after cleaning and further  inspection.  Wound is very superficial and unable to be improved significantly.  Most of the patient's pain is on the dorsal surface of the right hand, removed from the puncture.  No evidence to suspect a tendon or nerve injury.  Tetanus is up-to-date.  No indication for imaging.  Discussed return precautions if wound becomes infected.    ____________________________________________  FINAL CLINICAL IMPRESSION(S) / ED DIAGNOSES  Final diagnoses:  Laceration of right upper extremity, initial encounter    Note:  This document was prepared using Dragon voice recognition software and may include unintentional dictation errors.  Alona Bene, MD Emergency Medicine    Niyana Chesbro, Arlyss Repress, MD 09/06/18 [phone]

## 2018-09-05 NOTE — Discharge Instructions (Signed)
You were seen in the ED today with a cut to the arm. I do not see any retained glass in the wound. You should take Tylenol and/or Motrin as needed for pain. Return to the ED with any fever, redness near the cur, or drainage. Keep the area clean and dry.

## 2018-11-20 ENCOUNTER — Encounter (HOSPITAL_COMMUNITY): Payer: Self-pay

## 2019-06-06 IMAGING — US US MFM OB FOLLOW-UP EACH ADDL GEST (MODIFY)
1 series · 12 of 28 positions shown · non-contrast
Comparison: none

[Series 1: us mfm ob follow-up each addl gest (modify) · 88 acquisitions, 12 frames shown]
[im 4/88]
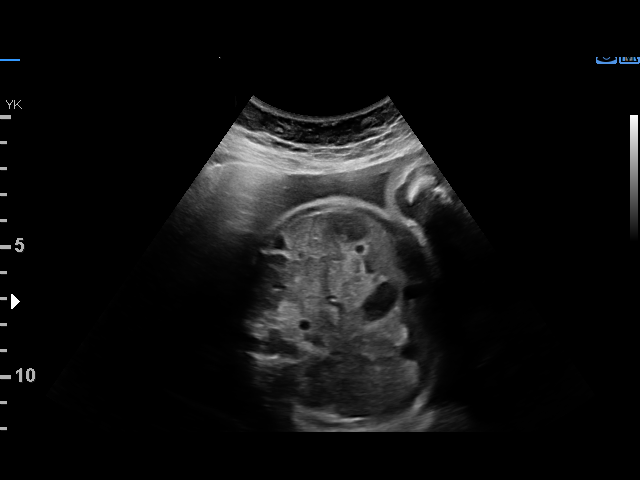
[im 10/88]
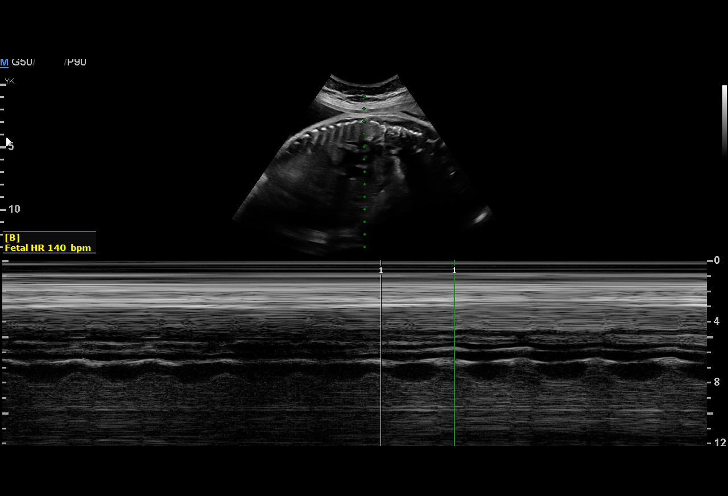
[im 17/88]
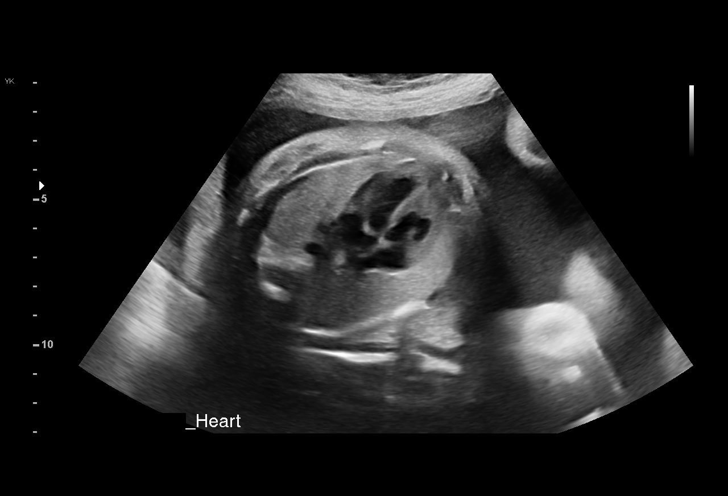
[im 26/88]
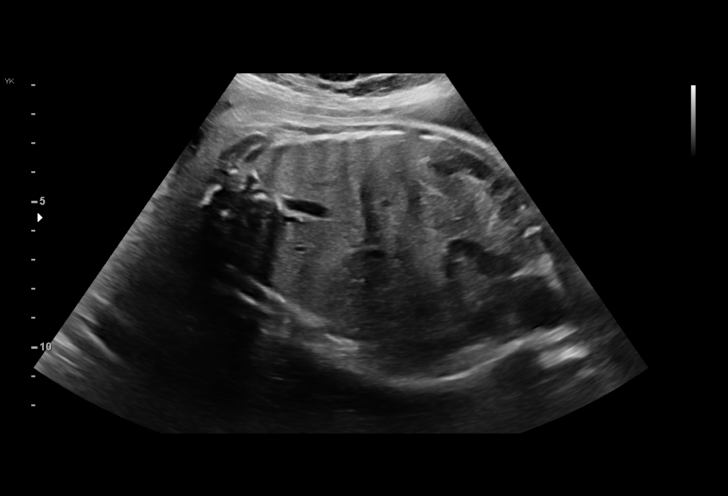
[im 33/88]
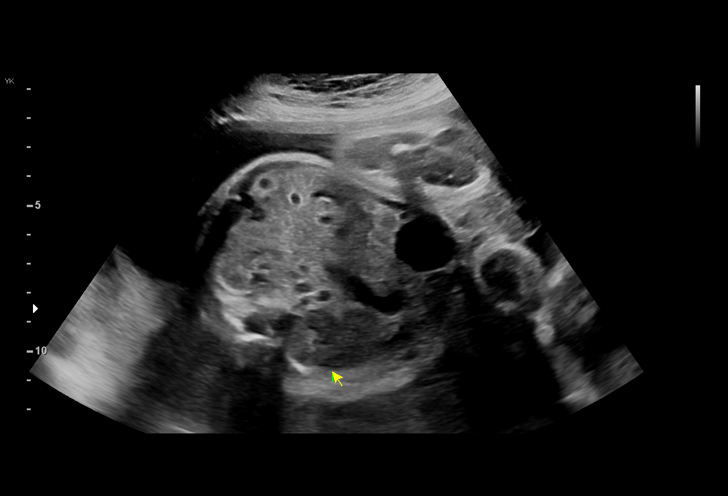
[im 39/88]
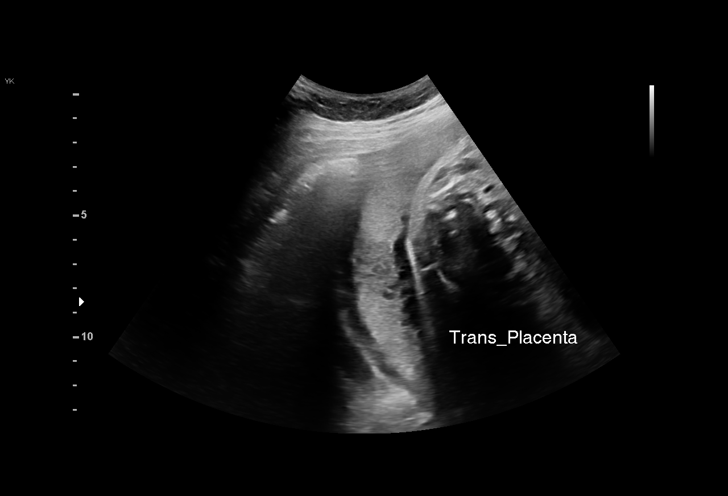
[im 49/88]
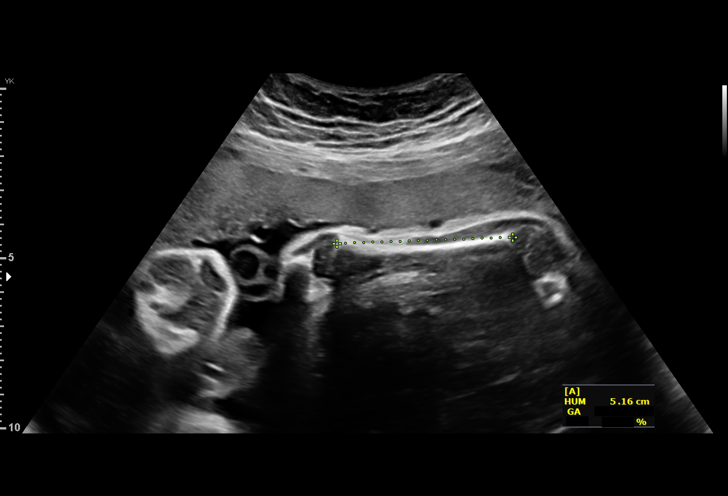
[im 55/88]
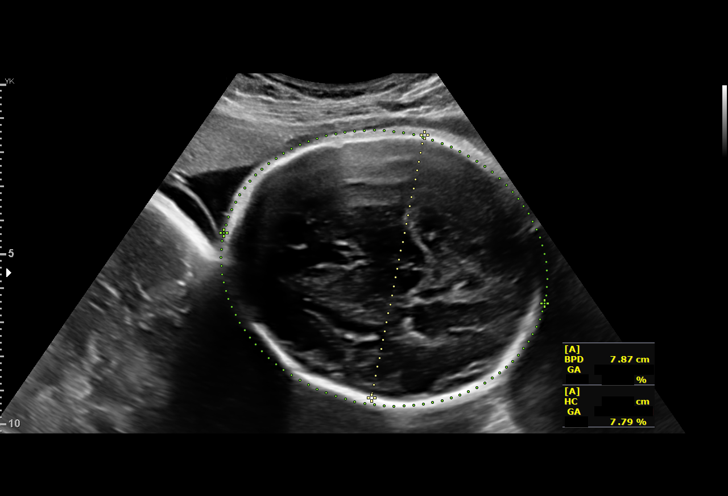
[im 62/88]
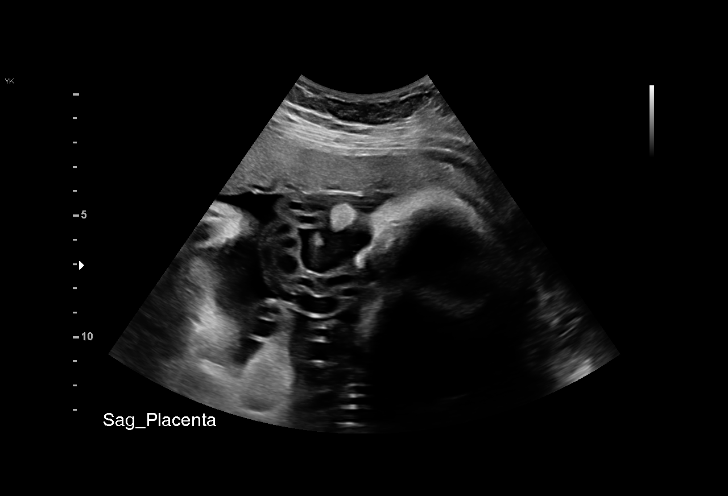
[im 71/88]
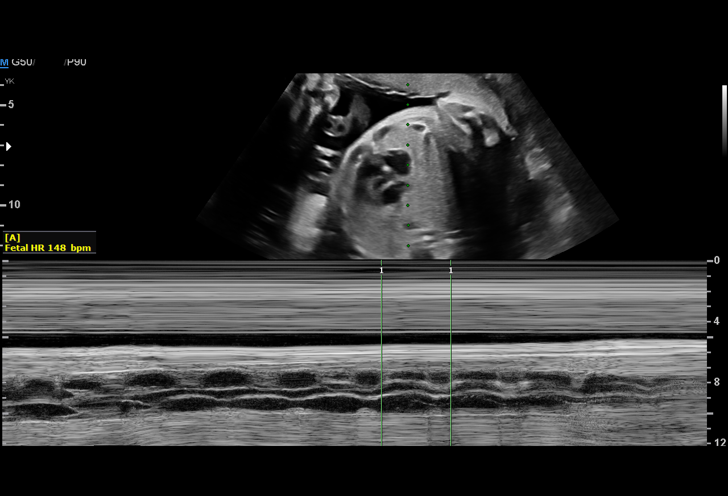
[im 78/88]
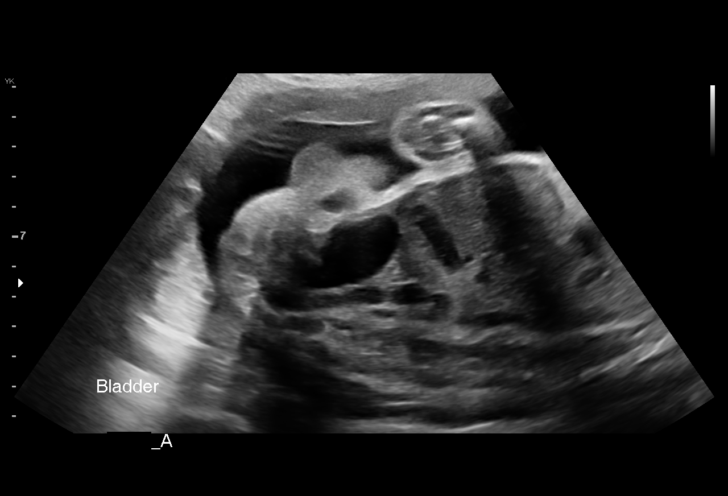
[im 84/88]
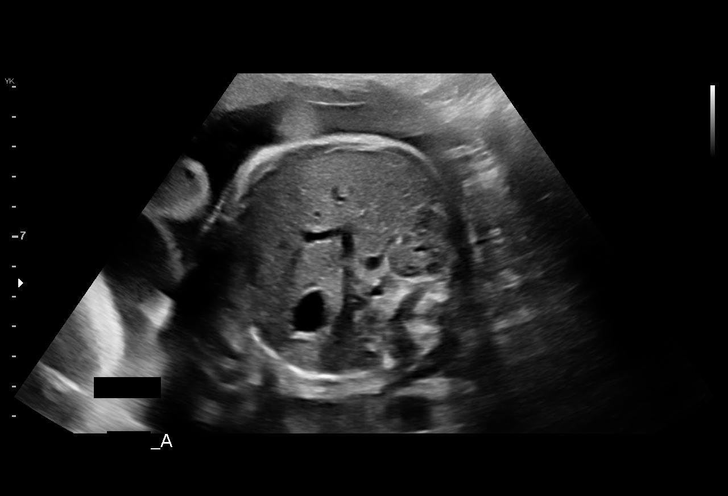

[12 of 28 positions shown; findings below may reference images not displayed]

2924 [REDACTED]

GEST

Indications

Twin pregnancy, di/di, second trimester
Encounter for other antenatal screening
follow-up
30 weeks gestation of pregnancy
Vital Signs

Height:        5'5"
Fetal Evaluation (Fetus A)

Num Of Fetuses:         2
Fetal Heart Rate(bpm):  148
Cardiac Activity:       Observed
Fetal Lie:              Maternal left side
Presentation:           Cephalic
Placenta:               Anterior

Amniotic Fluid
AFI FV:      Within normal limits

Largest Pocket(cm)
5.29
Biometry (Fetus A)

BPD:      78.5  mm     G. Age:  31w 4d         68  %    CI:        80.68   %    70 - 86
FL/HC:      20.3   %    19.3 -
HC:       276   mm     G. Age:  30w 1d          9  %    HC/AC:      1.00        0.96 -
AC:      276.9  mm     G. Age:  31w 6d         79  %    FL/BPD:     71.3   %    71 - 87
FL:         56  mm     G. Age:  29w 4d         11  %    FL/AC:      20.2   %    20 - 24
HUM:      51.2  mm     G. Age:  30w 0d         39  %

Est. FW:    6744  gm    3 lb 10 oz      60  %     FW Discordancy         6  %
OB History

Gravidity:    4         Term:   2         SAB:   1
Living:       2
Gestational Age (Fetus A)

LMP:           31w 3d        Date:  11/15/17                 EDD:   08/22/18
U/S Today:     30w 6d                                        EDD:   08/26/18
Best:          30w 4d     Det. By:  U/S Fetus B              EDD:   08/28/18
(03/31/18)
Anatomy (Fetus A)

Cranium:               Appears normal         Aortic Arch:            Previously seen
Cavum:                 Appears normal         Ductal Arch:            Previously seen
Ventricles:            Appears normal         Diaphragm:              Previously seen
Choroid Plexus:        Previously seen        Stomach:                Appears normal, left
sided
Cerebellum:            Previously seen        Abdomen:                Appears normal
Posterior Fossa:       Previously seen        Abdominal Wall:         Previously seen
Nuchal Fold:           Previously seen        Cord Vessels:           Previously seen
Face:                  Orbits and profile     Kidneys:                Appear normal
previously seen
Lips:                  Appears normal         Bladder:                Appears normal
Thoracic:              Appears normal         Spine:                  Previously seen
Heart:                 Appears normal         Upper Extremities:      Previously seen
(4CH, axis, and situs
RVOT:                  Previously seen        Lower Extremities:      Previously seen
LVOT:                  Previously seen

Other:  Fetus appears to be a male.

Fetal Evaluation (Fetus B)

Num Of Fetuses:         2
Fetal Heart Rate(bpm):  140
Cardiac Activity:       Observed
Fetal Lie:              Maternal right side
Presentation:           Cephalic
Placenta:               Posterior

Amniotic Fluid
AFI FV:      Within normal limits

Largest Pocket(cm)
2.5
Biometry (Fetus B)

BPD:      78.1  mm     G. Age:  31w 2d         63  %    CI:        73.34   %    70 - 86
FL/HC:      20.1   %    19.3 -
HC:      289.8  mm     G. Age:  31w 6d         52  %    HC/AC:      1.04        0.96 -
AC:       278   mm     G. Age:  31w 6d         81  %    FL/BPD:     74.6   %    71 - 87
FL:       58.3  mm     G. Age:  30w 3d         33  %    FL/AC:      21.0   %    20 - 24
Est. FW:    7713  gm    3 lb 14 oz      70  %     FW Discordancy      0 \ 6 %
Gestational Age (Fetus B)

LMP:           31w 3d        Date:  11/15/17                 EDD:   08/22/18
U/S Today:     31w 3d                                        EDD:   08/22/18
Best:          30w 4d     Det. By:  U/S Fetus B              EDD:   08/28/18
(03/31/18)
Anatomy (Fetus B)

Cranium:               Appears normal         Aortic Arch:            Previously seen
Cavum:                 Appears normal         Ductal Arch:            Previously seen
Ventricles:            Appears normal         Diaphragm:              Appears normal
Choroid Plexus:        Previously seen        Stomach:                Appears normal, left
sided
Cerebellum:            Previously seen        Abdomen:                Appears normal
Posterior Fossa:       Previously seen        Abdominal Wall:         Previously seen
Nuchal Fold:           Previously seen        Cord Vessels:           Previously seen
Face:                  Orbits and profile     Kidneys:                Appear normal
previously seen
Lips:                  Previously seen        Bladder:                Appears normal
Thoracic:              Appears normal         Spine:                  Previously seen
Heart:                 Previously seen        Upper Extremities:      Previously seen
RVOT:                  Previously seen        Lower Extremities:      Previously seen
LVOT:                  Previously seen

Other:  Male gender. Heels prev visualized. Nasal bone prev visualized.
Cervix Uterus Adnexa

Cervix
Length:            3.8  cm.
Normal appearance by transabdominal scan.
Impression

Dichorionic-diamniotic twin pregnancy.

Twin A: Maternal left, cephalic, anterior placenta. Fetal
growth is appropriate for gestational age. Amniotic fluid is
normal and good fetal activity is seen.

Twin B: Maternal right, cephalic, posterior placenta. Fetal
growth is appropriate for gestational age. Amniotic fluid is
normal and good fetal activity is seen.

Growth discordancy: 6% (normal).
Recommendations

An appointment was made for her to return in 4 weeks for
fetal growth assessment.

## 2019-07-16 IMAGING — US USMFM FETAL BPP W/O NON-STRESS ADDL GEST
1 series · 15 of 28 positions shown · non-contrast
Comparison: none

[Series 1: usmfm fetal bpp w/o non-stress addl gest · 42 acquisitions, 15 frames shown]
[im 1/42]
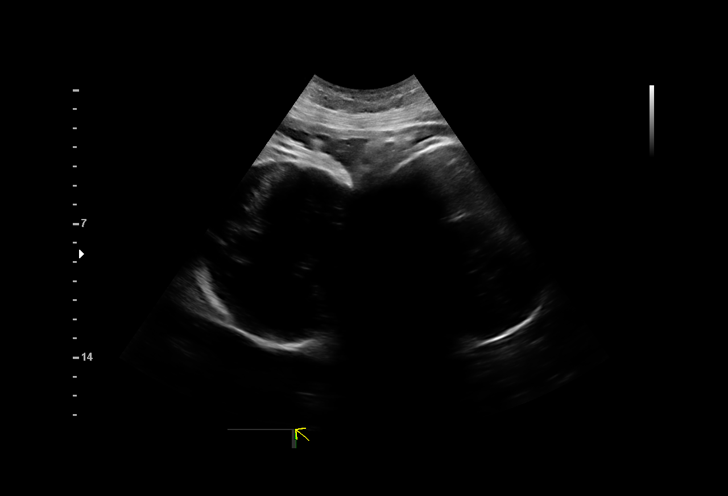
[im 4/42]
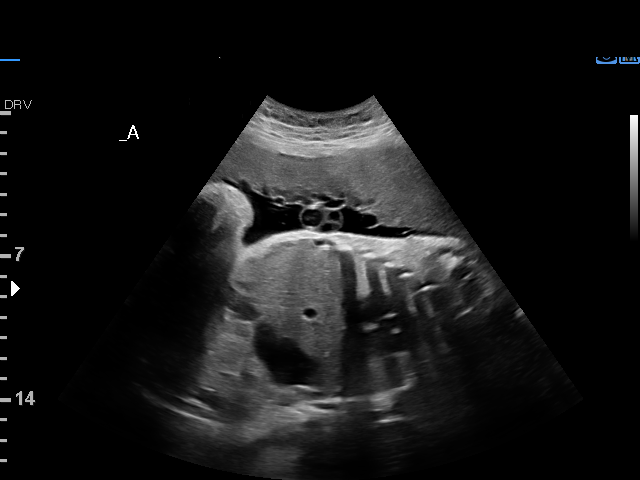
[im 7/42]
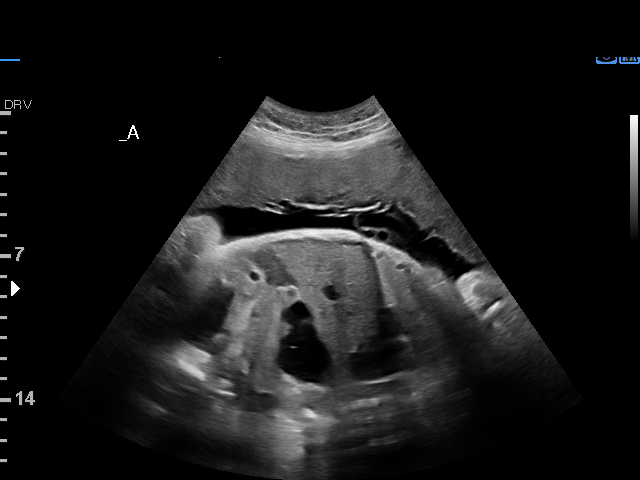
[im 10/42]
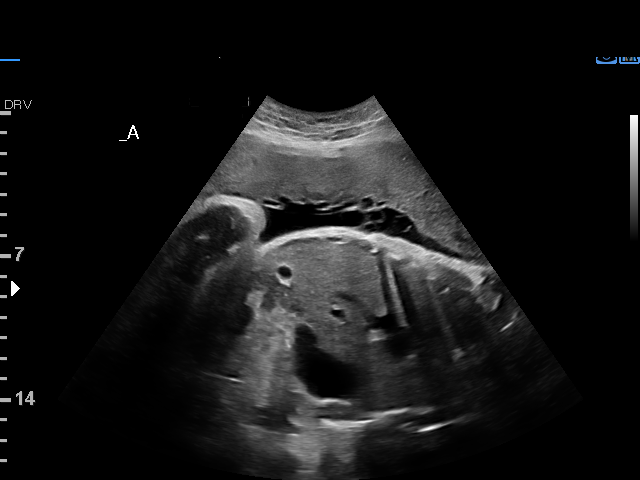
[im 13/42]
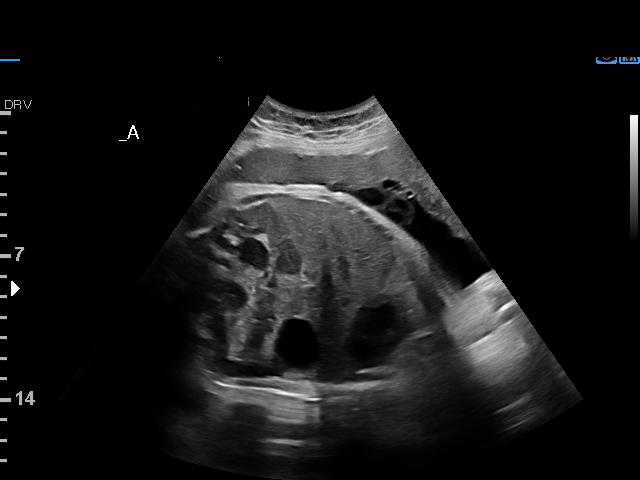
[im 16/42]
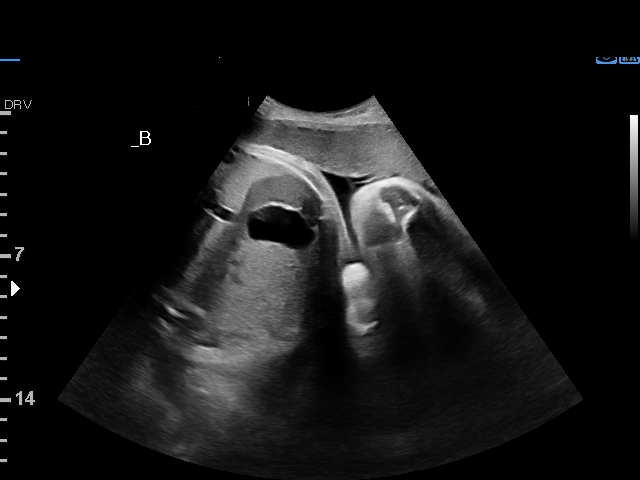
[im 19/42]
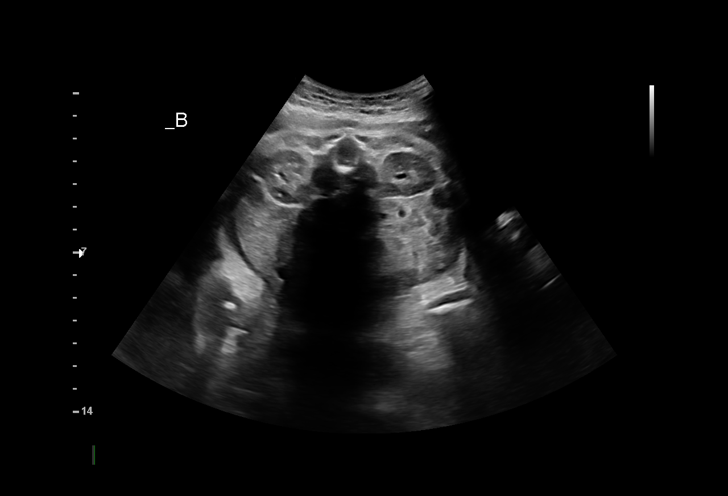
[im 22/42]
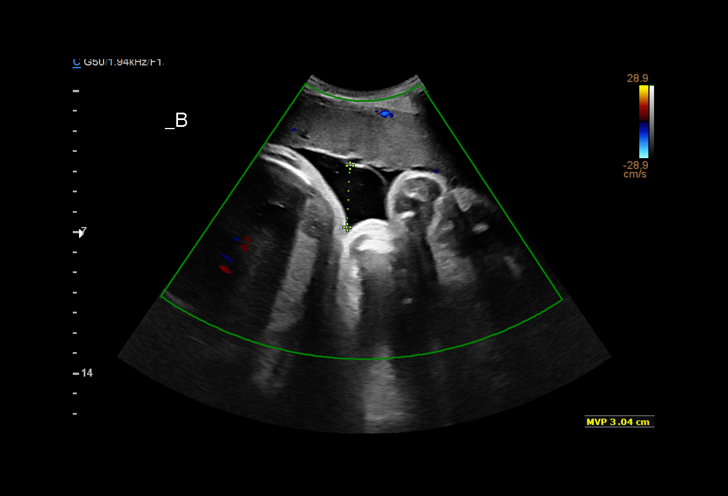
[im 23/42]
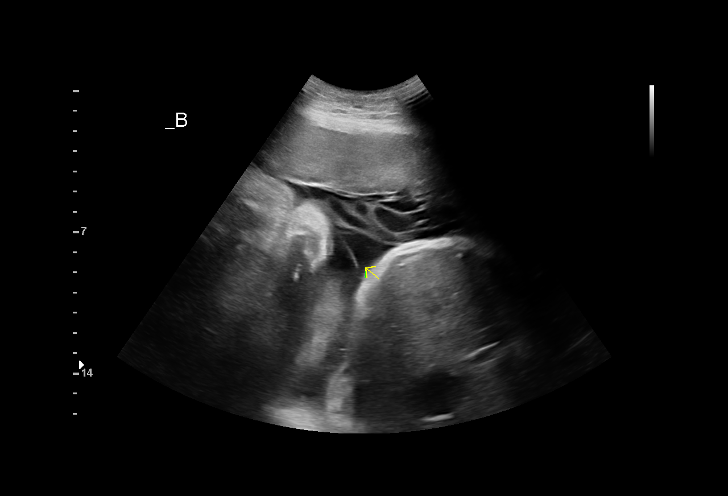
[im 26/42]
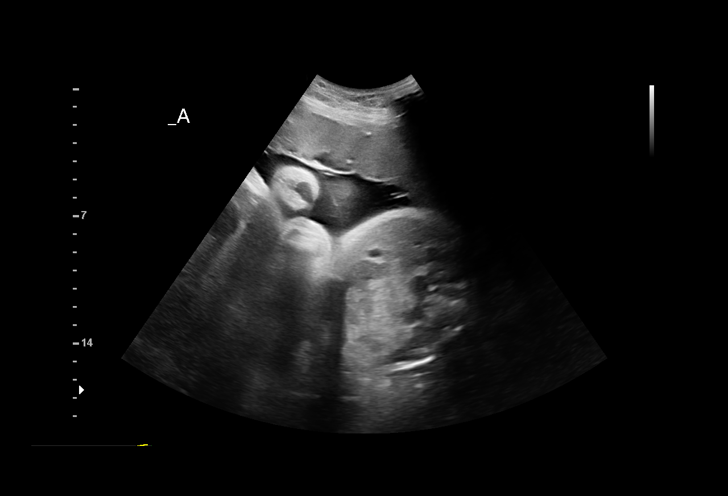
[im 29/42]
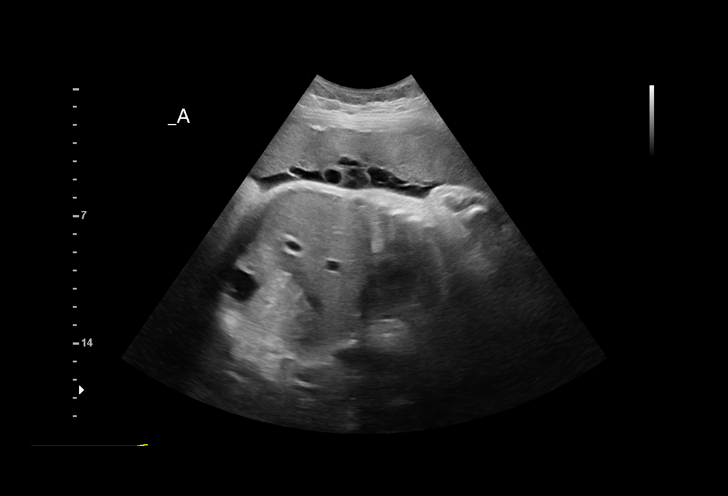
[im 32/42]
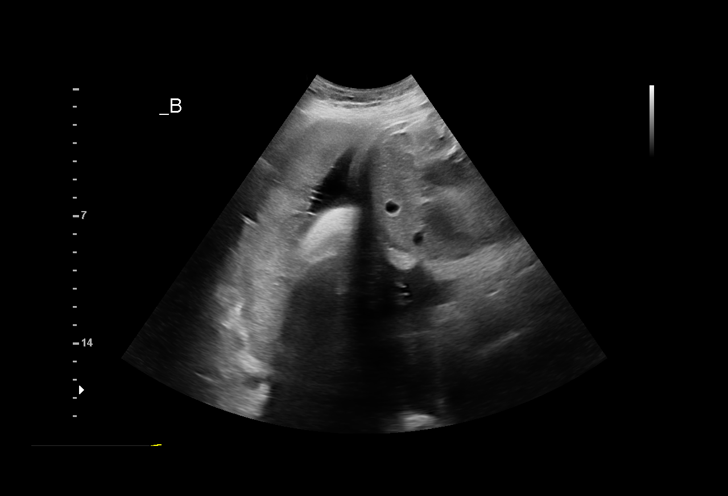
[im 35/42]
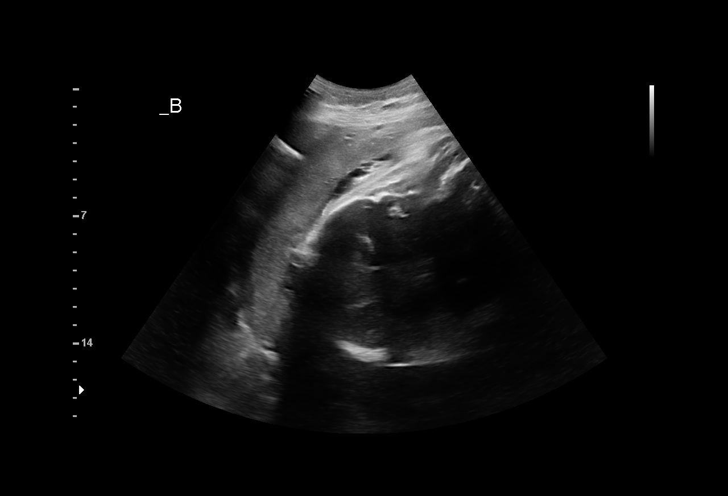
[im 38/42]
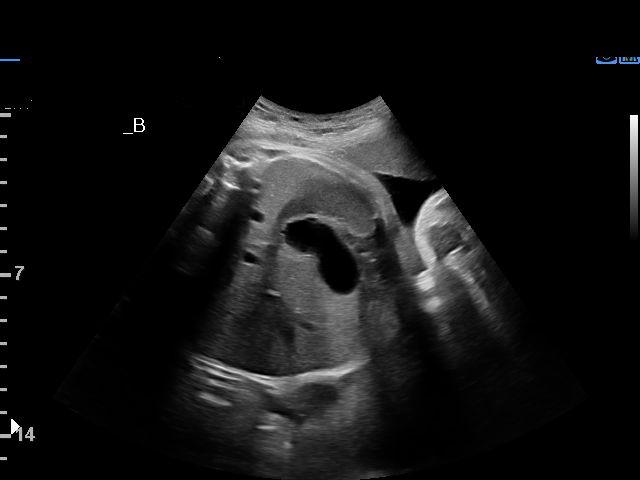
[im 42/42]
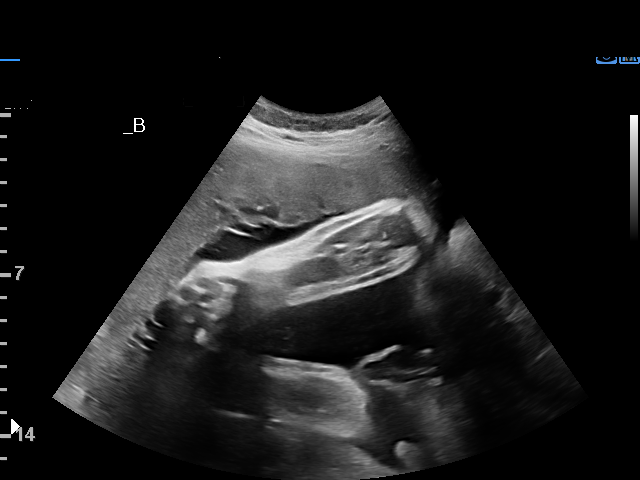

[15 of 28 positions shown; findings below may reference images not displayed]

9386 [REDACTED]

ADDL GESTATION

Indications

36 weeks gestation of pregnancy
Pre-eclampsia
Twin pregnancy, di/di, third trimester
Vital Signs

BMI:
Fetal Evaluation (Fetus A)

Num Of Fetuses:         2
Fetal Heart Rate(bpm):  153
Cardiac Activity:       Observed
Fetal Lie:              Maternal left side
Presentation:           Cephalic
Placenta:               Anterior
P. Cord Insertion:      Previously Visualized
Membrane Desc:      Dividing Membrane seen

Amniotic Fluid
AFI FV:      Within normal limits

Largest Pocket(cm)
2.17
Biophysical Evaluation (Fetus A)
Amniotic F.V:   Within normal limits       F. Tone:        Observed
F. Movement:    Observed                   Score:          [DATE]
F. Breathing:   Observed
OB History

Gravidity:    4         Term:   2         SAB:   1
Living:       2
Gestational Age (Fetus A)

LMP:           37w 1d        Date:  11/15/17                 EDD:   08/22/18
Best:          36w 2d     Det. By:  U/S Fetus B              EDD:   08/28/18
(03/31/18)

Fetal Evaluation (Fetus B)

Num Of Fetuses:         2
Fetal Heart Rate(bpm):  129
Cardiac Activity:       Observed
Fetal Lie:              Maternal right side
Presentation:           Cephalic
Placenta:               Posterior
P. Cord Insertion:      Previously Visualized
Membrane Desc:      Dividing Membrane seen

Amniotic Fluid
AFI FV:      Within normal limits

Largest Pocket(cm)
3.4
Biophysical Evaluation (Fetus B)

Amniotic F.V:   Within normal limits       F. Tone:        Observed
F. Movement:    Observed                   Score:          [DATE]
F. Breathing:   Observed
Gestational Age (Fetus B)

LMP:           37w 1d        Date:  11/15/17                 EDD:   08/22/18
Best:          36w 2d     Det. By:  U/S Fetus B              EDD:   08/28/18
(03/31/18)
Cervix Uterus Adnexa

Cervix
Normal appearance by transabdominal scan.
Impression

Dichorionic-diamniotic twin pregnancy.
Twin A: Maternal left, cephalic, anterior placenta. Fetal
growth is appropriate for gestational age. Amniotic fluid is
normal. Antenatal testing is reassuring. BPP [DATE].

Twin B: Maternal right, cephalic, posterior. Fetal growth is
appropriate for gestational age. Amniotic fluid is normal.
Antenatal testing is reassuring. BPP [DATE].
See consultation note in [REDACTED].

## 2020-10-11 NOTE — L&D Delivery Note (Signed)
Galileah Piggee is a 35 y.o. female [phone] with IUP at [redacted]w[redacted]d admitted for IOL for postdates.  She progressed with cytotec and pitocin augmentation to complete and pushed 30 minutes to deliver.  Cord clamping delayed by several minutes then clamped by CNM and cut by FOB.      Susy, Placzek [[id number]]  Delivery Note At 5:18 PM a viable female was delivered via  (Presentation:  Right Occiput Anterior).  APGAR: 8, 9; weight pending.   Placenta status:  spontaneous, intact.  Cord:  3 vessels with the following complications: nuchal x2, delivered through and reduced after.  Anesthesia: Epidural Episiotomy:  n/a Lacerations:  none Suture Repair: n/a Est. Blood Loss (mL): 100  Mom to postpartum.  Baby to Couplet care / Skin to Skin.  Rolm Bookbinder CNM 06/08/2021, 5:27 PM

## 2021-01-22 ENCOUNTER — Ambulatory Visit (INDEPENDENT_AMBULATORY_CARE_PROVIDER_SITE_OTHER): Payer: Medicaid Other | Admitting: *Deleted

## 2021-01-22 ENCOUNTER — Encounter: Payer: Self-pay | Admitting: *Deleted

## 2021-01-22 DIAGNOSIS — Z3201 Encounter for pregnancy test, result positive: Secondary | ICD-10-CM

## 2021-01-22 DIAGNOSIS — Z3200 Encounter for pregnancy test, result unknown: Secondary | ICD-10-CM

## 2021-01-22 LAB — POCT PREGNANCY, URINE: Preg Test, Ur: POSITIVE — AB

## 2021-01-22 NOTE — Progress Notes (Signed)
Pt submitted urine sample earlier today for pregnancy test. I called her and advised of positive result. Pt was unable to answer any questions. She stated that she was driving and asked to be called back in about 20 minutes.   1600  Called pt and she stated LMP was early November. She does not know the exact date and was breastfeeding at the time.  Pt was asked if she has sx of pregnancy. She states her belly is bigger and she believes she has felt fetal movement. Pt has received Gyn care @ this office - last seen in 2016. She reports that she received prenatal care with 2 previous pregnancies @ [facility], however delivered babies @ [facility] - 2017 & 2019. Her 2019 delivery was di/di twins. I advised that she needs ultrasound to determine due date and we will also schedule prenatal care to begin. Ultrasound scheduled on 5/2 @ 1:45 and she will need to arrive @ 1:30.  New Ob appointment for prenatal care scheduled on 5/5 @ 8:55 am. Verification of pregnancy letter will be composed and she will be able to pick up @ front office next week. Pt voiced understanding of all information and instructions given.

## 2021-01-26 NOTE — Progress Notes (Signed)
Patient was assessed and managed by nursing staff during this encounter. I have reviewed the chart and agree with the documentation and plan. I have also made any necessary editorial changes.  Warden Fillers, MD 01/26/2021 8:18 AM

## 2021-02-05 ENCOUNTER — Encounter: Payer: Self-pay | Admitting: *Deleted

## 2021-02-09 ENCOUNTER — Encounter: Payer: Self-pay | Admitting: *Deleted

## 2021-02-09 ENCOUNTER — Other Ambulatory Visit: Payer: Self-pay | Admitting: *Deleted

## 2021-02-09 ENCOUNTER — Ambulatory Visit: Payer: Medicaid Other

## 2021-02-09 ENCOUNTER — Ambulatory Visit (HOSPITAL_BASED_OUTPATIENT_CLINIC_OR_DEPARTMENT_OTHER): Payer: Medicaid Other

## 2021-02-09 ENCOUNTER — Ambulatory Visit: Payer: Medicaid Other | Attending: Family Medicine | Admitting: *Deleted

## 2021-02-09 ENCOUNTER — Other Ambulatory Visit: Payer: Self-pay

## 2021-02-09 VITALS — BP 113/63 | HR 81

## 2021-02-09 DIAGNOSIS — Z3A25 25 weeks gestation of pregnancy: Secondary | ICD-10-CM | POA: Insufficient documentation

## 2021-02-09 DIAGNOSIS — O09522 Supervision of elderly multigravida, second trimester: Secondary | ICD-10-CM

## 2021-02-09 DIAGNOSIS — Z3200 Encounter for pregnancy test, result unknown: Secondary | ICD-10-CM

## 2021-02-09 DIAGNOSIS — Z362 Encounter for other antenatal screening follow-up: Secondary | ICD-10-CM

## 2021-02-09 DIAGNOSIS — O0930 Supervision of pregnancy with insufficient antenatal care, unspecified trimester: Secondary | ICD-10-CM

## 2021-02-12 ENCOUNTER — Other Ambulatory Visit (HOSPITAL_COMMUNITY)
Admission: RE | Admit: 2021-02-12 | Discharge: 2021-02-12 | Disposition: A | Discharge status: Home / Self Care | Payer: Medicaid Other | Source: Ambulatory Visit | Attending: Obstetrics and Gynecology | Admitting: Obstetrics and Gynecology

## 2021-02-12 ENCOUNTER — Encounter: Payer: Self-pay | Admitting: Obstetrics and Gynecology

## 2021-02-12 ENCOUNTER — Other Ambulatory Visit: Payer: Self-pay

## 2021-02-12 ENCOUNTER — Ambulatory Visit (INDEPENDENT_AMBULATORY_CARE_PROVIDER_SITE_OTHER): Payer: Medicaid Other | Admitting: Obstetrics and Gynecology

## 2021-02-12 VITALS — BP 112/77 | HR 85 | Wt 209.8 lb

## 2021-02-12 DIAGNOSIS — O26899 Other specified pregnancy related conditions, unspecified trimester: Secondary | ICD-10-CM

## 2021-02-12 DIAGNOSIS — O0990 Supervision of high risk pregnancy, unspecified, unspecified trimester: Secondary | ICD-10-CM

## 2021-02-12 DIAGNOSIS — O09293 Supervision of pregnancy with other poor reproductive or obstetric history, third trimester: Secondary | ICD-10-CM | POA: Insufficient documentation

## 2021-02-12 DIAGNOSIS — O0930 Supervision of pregnancy with insufficient antenatal care, unspecified trimester: Secondary | ICD-10-CM

## 2021-02-12 DIAGNOSIS — Z8349 Family history of other endocrine, nutritional and metabolic diseases: Secondary | ICD-10-CM

## 2021-02-12 DIAGNOSIS — O4442 Low lying placenta NOS or without hemorrhage, second trimester: Secondary | ICD-10-CM

## 2021-02-12 DIAGNOSIS — Z6791 Unspecified blood type, Rh negative: Secondary | ICD-10-CM

## 2021-02-12 DIAGNOSIS — Z3A25 25 weeks gestation of pregnancy: Secondary | ICD-10-CM

## 2021-02-12 DIAGNOSIS — O09292 Supervision of pregnancy with other poor reproductive or obstetric history, second trimester: Secondary | ICD-10-CM

## 2021-02-12 DIAGNOSIS — O0900 Supervision of pregnancy with history of infertility, unspecified trimester: Secondary | ICD-10-CM | POA: Insufficient documentation

## 2021-02-12 HISTORY — DX: Family history of other endocrine, nutritional and metabolic diseases: Z83.49

## 2021-02-12 LAB — POCT URINALYSIS DIP (DEVICE)
Bilirubin Urine: NEGATIVE
Glucose, UA: NEGATIVE mg/dL
Hgb urine dipstick: NEGATIVE
Ketones, ur: NEGATIVE mg/dL
Leukocytes,Ua: NEGATIVE
Nitrite: NEGATIVE
Protein, ur: NEGATIVE mg/dL
Specific Gravity, Urine: 1.02 (ref 1.005–1.030)
Urobilinogen, UA: 0.2 mg/dL (ref 0.0–1.0)
pH: 7.5 (ref 5.0–8.0)

## 2021-02-12 MED ORDER — BLOOD PRESSURE KIT DEVI: 1.0000 | 0 refills | Status: DC | PRN

## 2021-02-12 MED ORDER — ASPIRIN 81 MG PO CHEW: 81.0000 mg | CHEWABLE_TABLET | Freq: Every day | ORAL | 6 refills | Status: DC

## 2021-02-12 NOTE — Addendum Note (Signed)
Addended byVidal Schwalbe on: 02/12/2021 10:53 AM   Modules accepted: Orders

## 2021-02-12 NOTE — Progress Notes (Signed)
INITIAL PRENATAL VISIT NOTE  Subjective:  Jenny [name] is a 35 y.o. [id number] at 25 wks 3 days by LMP and ultrasound being seen today for her initial prenatal visit. She has an obstetric history significant for postpartum hemorrhage, hx of placenta previa and current low lying placenta. She has a medical history significant for nothing.  Patient reports no complaints.  Contractions: Irritability. Vag. Bleeding: None.  Movement: Present. Denies leaking of fluid.    Past Medical History:  Diagnosis Date  . Mild preeclampsia, third trimester 08/01/2018  . Postpartum hemorrhage - 3400 cc 08/06/2018  . Shortness of breath    when running  . Urinary tract infection   . Vaginal Pap smear, abnormal     Past Surgical History:  Procedure Laterality Date  . LIP REPAIR      OB History  Gravida Para Term Preterm AB Living  5 3 2 1 1 4   SAB IAB Ectopic Multiple Live Births  1 0 0 1 4    # Outcome Date GA Lbr Len/2nd Weight Sex Delivery Anes PTL Lv  5 Current           4A Preterm 08/06/18 [redacted]w[redacted]d  6 lb (2.722 kg) M Vag-Spont EPI Y LIV     Complications: Other Excessive Bleeding  4B Preterm 07/27/18 [redacted]w[redacted]d  6 lb 11 oz (3.033 kg) M Vag-Spont EPI Y LIV     Complications: Other Excessive Bleeding  3 Term 07/29/16 [redacted]w[redacted]d 03:21 / 00:34 9 lb 7 oz (4.28 kg) M Vag-Spont EPI  LIV  2 Term 02/06/13 [redacted]w[redacted]d 29:06 / 05:05 8 lb 3 oz (3.714 kg) M Vag-Spont EPI  LIV  1 SAB             Social History   Socioeconomic History  . Marital status: Married    Spouse name: Not on file  . Number of children: Not on file  . Years of education: Not on file  . Highest education level: Not on file  Occupational History  . Not on file  Tobacco Use  . Smoking status: Never Smoker  . Smokeless tobacco: Never Used  Substance and Sexual Activity  . Alcohol use: No  . Drug use: No  . Sexual activity: Yes    Birth control/protection: None  Other Topics Concern  . Not on file  Social History Narrative  .  Not on file   Social Determinants of Health   Financial Resource Strain: Not on file  Food Insecurity: Not on file  Transportation Needs: Not on file  Physical Activity: Not on file  Stress: Not on file  Social Connections: Not on file    Family History  Problem Relation Age of Onset  . Thyroid disease Mother   . Diabetes Paternal Grandfather   . Stroke Paternal Grandmother   . Diabetes Paternal Grandmother   . Stroke Maternal Grandmother   . Stroke Maternal Grandfather      Current Outpatient Medications:  .  acetaminophen (TYLENOL) 325 MG tablet, Take 325 mg by mouth as needed for moderate pain., Disp: , Rfl:  .  aspirin 81 MG chewable tablet, Chew 1 tablet (81 mg total) by mouth daily., Disp: 30 tablet, Rfl: 6 .  Prenatal Vit-Fe Fumarate-FA (PRENATAL MULTIVITAMIN) TABS tablet, Take 1 tablet by mouth daily at 12 noon., Disp: , Rfl:   Allergies  Allergen Reactions  . 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  Objective:   Vitals:   02/12/21 0926  BP: 112/77  Pulse: 85  Weight: 209 lb 12.8 oz (95.2 kg)    Fetal Status: Fetal Heart Rate (bpm): 143   Movement: Present     Physical Exam: BP 112/77   Pulse 85   Wt 209 lb 12.8 oz (95.2 kg)   LMP 08/15/2020 (Exact Date)   Breastfeeding Unknown   BMI 34.91 kg/m  CONSTITUTIONAL: Well-developed, well-nourished female in no acute distress.  NEUROLOGIC: Alert and oriented to person, place, and time. Normal reflexes, muscle tone coordination. No cranial nerve deficit noted. PSYCHIATRIC: Normal mood and affect. Normal behavior. Normal judgment and thought content. SKIN: Skin is warm and dry. No rash noted. Not diaphoretic. No erythema. No pallor. HENT:  Normocephalic, atraumatic, External right and left ear normal. Oropharynx is clear and moist EYES: Conjunctivae and EOM are normal. No scleral icterus.  NECK: Normal range of  motion, supple, no masses CARDIOVASCULAR: Normal heart rate noted, regular rhythm RESPIRATORY: Effort and breath sounds normal, no problems with respiration noted BREASTS: deferred ABDOMEN: Soft, nontender, nondistended, gravid. GU: normal appearing external female genitalia, multiparous normal appearing cervix, scant white discharge in vagina, no lesions noted, pap taken Bimanual:deferred due to low lying placenta MUSCULOSKELETAL: Normal range of motion. EXT:  No edema and no tenderness. 2+ distal pulses.   Assessment and Plan:  Pregnancy: [id number] at 25.3 weeks by u/s and LMP  1. Supervision of high risk pregnancy, antepartum 2 hour GTT in 3 weeks Pt has rescan for low lying placenta in 4 weeks - Genetic Screening - CH[facility] AMB BABYSCRIPTS SCHEDULE OPTIMIZATION - Culture, OB Urine - GC/Chlamydia probe amp ([facility])not at [facility] - ABO/Rh; Future - Antibody screen; Future - Hepatitis C antibody - Hepatitis B surface antigen - HIV Antibody (routine testing w rflx) - RPR - Rubella screen - CBC  2. [redacted] weeks gestation of pregnancy   3. Hx of preeclampsia, prior pregnancy, currently pregnant, second trimester  - Protein / creatinine ratio, urine - Comprehensive metabolic panel - aspirin 81 MG chewable tablet; Chew 1 tablet (81 mg total) by mouth daily.  Dispense: 30 tablet; Refill: 6  4. Late prenatal care   5. Rh negative state in antepartum period Rhogam next visit  6. Hx of postpartum hemorrhage, currently pregnant, second trimester Consider TXA after delivery  7. Hx of of hemochromatosis, family Refer to genetic counseling   Preterm labor symptoms and general obstetric precautions including but not limited to vaginal bleeding, contractions, leaking of fluid and fetal movement were reviewed in detail with the patient.  Please refer to After Visit Summary for other counseling recommendations.   Return in about 3 weeks (around 03/05/2021) for [facility], in person, 2 hr GTT,  3rd trim labs.  Warden Fillers 02/12/2021 10:06 AM

## 2021-02-12 NOTE — Addendum Note (Signed)
Addended byVidal Schwalbe on: 02/12/2021 10:22 AM   Modules accepted: Orders

## 2021-02-13 LAB — COMPREHENSIVE METABOLIC PANEL
ALT: 12 IU/L (ref 0–32)
AST: 18 IU/L (ref 0–40)
Albumin/Globulin Ratio: 1.6 (ref 1.2–2.2)
Albumin: 3.7 g/dL — ABNORMAL LOW (ref 3.8–4.8)
Alkaline Phosphatase: 66 IU/L (ref 44–121)
BUN/Creatinine Ratio: 12 (ref 9–23)
BUN: 7 mg/dL (ref 6–20)
Bilirubin Total: 0.4 mg/dL (ref 0.0–1.2)
CO2: 17 mmol/L — ABNORMAL LOW (ref 20–29)
Calcium: 8.9 mg/dL (ref 8.7–10.2)
Chloride: 104 mmol/L (ref 96–106)
Creatinine, Ser: 0.58 mg/dL (ref 0.57–1.00)
Globulin, Total: 2.3 g/dL (ref 1.5–4.5)
Glucose: 81 mg/dL (ref 65–99)
Potassium: 4.2 mmol/L (ref 3.5–5.2)
Sodium: 138 mmol/L (ref 134–144)
Total Protein: 6 g/dL (ref 6.0–8.5)
eGFR: 121 mL/min/{1.73_m2} (ref >59)

## 2021-02-13 LAB — CBC
Hematocrit: 37.2 % (ref 34.0–46.6)
Hemoglobin: 12.4 g/dL (ref 11.1–15.9)
MCH: 30.1 pg (ref 26.6–33.0)
MCHC: 33.3 g/dL (ref 31.5–35.7)
MCV: 90 fL (ref 79–97)
Platelets: 215 10*3/uL (ref 150–450)
RBC: 4.12 x10E6/uL (ref 3.77–5.28)
RDW: 12.6 % (ref 11.7–15.4)
WBC: 8.5 10*3/uL (ref 3.4–10.8)

## 2021-02-13 LAB — CERVICOVAGINAL ANCILLARY ONLY
Bacterial Vaginitis (gardnerella): NEGATIVE
Candida Glabrata: NEGATIVE
Candida Vaginitis: NEGATIVE
Chlamydia: NEGATIVE
Comment: NEGATIVE
Comment: NEGATIVE
Comment: NEGATIVE
Comment: NEGATIVE
Comment: NEGATIVE
Comment: NORMAL
Neisseria Gonorrhea: NEGATIVE
Trichomonas: NEGATIVE

## 2021-02-13 LAB — HIV ANTIBODY (ROUTINE TESTING W REFLEX): HIV Screen 4th Generation wRfx: NONREACTIVE

## 2021-02-13 LAB — PROTEIN / CREATININE RATIO, URINE
Creatinine, Urine: 98.6 mg/dL
Protein, Ur: 19.9 mg/dL
Protein/Creat Ratio: 202 mg/g creat — ABNORMAL HIGH (ref 0–200)

## 2021-02-13 LAB — HEPATITIS B SURFACE ANTIGEN: Hepatitis B Surface Ag: NEGATIVE

## 2021-02-13 LAB — RUBELLA SCREEN: Rubella Antibodies, IGG: 1.76 index (ref >0.99)

## 2021-02-13 LAB — RPR: RPR Ser Ql: NONREACTIVE

## 2021-02-13 LAB — HEPATITIS C ANTIBODY: Hep C Virus Ab: <0.1 s/co ratio (ref 0.0–0.9)

## 2021-02-14 LAB — CULTURE, OB URINE

## 2021-02-14 LAB — URINE CULTURE, OB REFLEX

## 2021-02-16 ENCOUNTER — Telehealth: Payer: Self-pay | Admitting: Lactation Services

## 2021-02-16 DIAGNOSIS — O0990 Supervision of high risk pregnancy, unspecified, unspecified trimester: Secondary | ICD-10-CM

## 2021-02-16 LAB — CYTOLOGY - PAP
Comment: NEGATIVE
Diagnosis: NEGATIVE
High risk HPV: NEGATIVE

## 2021-02-16 NOTE — Telephone Encounter (Addendum)
Patient called back in for results.   Patient reports that she has not been taking her PNV x 1 week but now has a new bottle.  Patient would like to know if she is a carrier or has Hemochromatosis. Her mom uncle are carriers and her aunt has the disease. Her cousin died from the disease. She is concerned with getting extra iron if her body is not able to tolerate it. She reports her cousin had low iron but organs were damaged by the Iron accumulating in them. She is interested in further testing and Genetic Counseling.   Genetic Counseling ordered through MFM. Scheduled for 5/10 at 12:45. Patient informed of day, time and location of appointment.

## 2021-02-16 NOTE — Telephone Encounter (Signed)
Called patient to give lab results. Patient did not answer. LM for her to call the office at [phone]  at her convenience for lab results and recommendations.

## 2021-02-16 NOTE — Telephone Encounter (Signed)
-----   Message from Warden Fillers, MD sent at 02/16/2021  8:39 AM EDT ----- Prenatal labs WNL. Moderate anemia noted.  Can offer iron infusion

## 2021-02-16 NOTE — Addendum Note (Signed)
Addended by: Ed Blalock on: 02/16/2021 11:59 AM   Modules accepted: Orders

## 2021-02-16 NOTE — Telephone Encounter (Signed)
Patient returned call to office, attempted to call her back and was not able to reach her. LM for her to call the office at her convenience.

## 2021-02-17 ENCOUNTER — Encounter: Payer: Self-pay | Admitting: Lactation Services

## 2021-02-17 ENCOUNTER — Other Ambulatory Visit: Payer: Self-pay

## 2021-02-17 ENCOUNTER — Ambulatory Visit: Payer: Medicaid Other

## 2021-02-17 ENCOUNTER — Ambulatory Visit: Payer: Medicaid Other | Attending: Obstetrics and Gynecology | Admitting: Genetic Counselor

## 2021-02-17 DIAGNOSIS — Z8489 Family history of other specified conditions: Secondary | ICD-10-CM | POA: Diagnosis not present

## 2021-02-17 DIAGNOSIS — Z3A26 26 weeks gestation of pregnancy: Secondary | ICD-10-CM | POA: Diagnosis not present

## 2021-02-17 DIAGNOSIS — Z8349 Family history of other endocrine, nutritional and metabolic diseases: Secondary | ICD-10-CM

## 2021-02-17 DIAGNOSIS — O0992 Supervision of high risk pregnancy, unspecified, second trimester: Secondary | ICD-10-CM | POA: Diagnosis not present

## 2021-02-17 DIAGNOSIS — Z315 Encounter for genetic counseling: Secondary | ICD-10-CM

## 2021-02-17 DIAGNOSIS — Z810 Family history of intellectual disabilities: Secondary | ICD-10-CM | POA: Diagnosis not present

## 2021-02-17 NOTE — Progress Notes (Signed)
Genetic Screening was short draw and needs to be redrawn. Notified Joyce Gross, Dentist who is meeting with patient today and is planning to redraw the Genetic Screening today along with other blood tests.

## 2021-02-17 NOTE — Progress Notes (Signed)
02/17/2021  Jenny [name] 09/21/1986 MRN: [id number] DOV: 02/17/2021  Jenny [name] presented to the [facility] for a genetics consultation regarding her family history of hemochromatosis. Jenny [name] presented to her appointment alone.   Indication for genetic counseling - Family history of hemochromatosis  Prenatal history  Jenny [name] is a [id number], 35 y.o. female. Her current pregnancy has completed [redacted]w[redacted]d (Estimated Date of Delivery: 05/22/21). Jenny [name] and her husband have an 66 year old son, a 49 year old son, and twin 80 year old sons together. Jenny [name] has also had one miscarriage.  Jenny [name] denied exposure to environmental toxins or chemical agents. She denied the use of alcohol, tobacco or street drugs. She denied significant viral illnesses, fevers, and bleeding during the course of her pregnancy. 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 She had preeclampsia in a prior pregnancy. Her medical and surgical histories were otherwise noncontributory.  Family History  A three generation pedigree was drafted and reviewed. The family history is remarkable for the following:  - Jenny [name] has two maternal first cousins with hemochromatosis. Her maternal uncle and her mother are known carriers for the condition. See Discussion section for more details.   -  Jenny [name]'s four year old son, her husband, and her mother in law also have/had "speech impediments". Her son is currently in speech therapy. We discussed that many times, developmental delays such as speech delays are multifactorial in nature, occurring due to a combination of genetic and environmental factors that are difficult to identify. Developmental delays can appear to run in families; thus, there is a chance that the couple's other children could also experience speech delays/difficulties of some kind. Jenny [name] understands that she should make the  pediatrician aware of any concerns she has about her children's development.  - Jenny [name]'s twins were born with a "hole in their hearts". They have not yet required any surgical repair or treatment for their congenital heart defects (CHDs) and are being followed by Cardiology. Jenny [name] mother in law was also born with a "hole in her heart". We reviewed that there are also multifactorial causes for isolated CHDs, including environmental and genetic factors. Given the family history, the risk for a CHD in the current fetus or Jenny [name]'s future children could be 10% or greater. A fetal echocardiogram to assess for CHDs in the current fetus is recommended.  - Jenny [name]'s husband had a very large redundant colon for which he has had a colectomy. His sister and father also have redundant colons; however, they have not required any surgical treatment for theirs. While some individuals have a redundant colon with no known cause, others may have a genetic predisposition for a redundant colon. We discussed that since Mr. Gailey and several of his family members have redundant colons, his children are at increased risk for having one too.  The remaining family histories were reviewed and found to be noncontributory for birth defects, intellectual disability, recurrent pregnancy loss, and known genetic conditions.    The patient's ancestry is [place], [place], Film[place]video [place], [place], and Native American ([place]). The father of the pregnancy's ancestry is [place] and Native American ([place]). Ashkenazi Jewish ancestry and consanguinity were denied. Pedigree will be scanned under Media.  Discussion  Family history of hemochromatosis:  Ms. Terris was referred for genetic counseling due to her family history of hemochromatosis. Her cousin Amy was diagnosed with the condition at age 48. She had anemia but  her organs were reportedly damaged by iron accumulation and she ended up dying at  age 51 due to complications of heart failure. Amy also struggled with alcoholism, which likely contributed to her hemochromatosis. After Amy's diagnosis, her brother Madelaine Bhat underwent testing and was also diagnosed with hemochromatosis in his 30s. He has not had symptoms of the condition and is being treated via phlebotomy. Amy and Adam's parents (Ms. Perezgarcia's maternal uncle and his wife) then underwent testing for hemochromatosis, where it was discovered that Ms. Navedo's uncle is a carrier for the condition and his wife is affected. She is also now being treated via phlebotomy. Jenny [name] mother recently underwent testing and was identified as a carrier for the condition. In light of this, Jenny [name] was interested in learning more about hemochromatosis and her testing options.  We discussed that hemochromatosis is a condition that causes the body to absorb too much iron from the diet. The excess iron is stored in the body's tissues and organs, particularly the skin, heart, liver, pancreas, and joints. As a result, the excess iron can overload and eventually damage these tissues and organs.  Hemochromatosis can be difficult to diagnose, as many people with the disease don't have any signs or symptoms other than elevated levels of iron in their blood. Hemochromatosis is often identified because of abnormal blood tests done for other reasons or from screening of family members of individuals diagnosed with the disease. However, some individuals with early symptoms of hemochromatosis may experience fatigue, joint pain, abdominal pain, weight loss, and loss of sex drive. As the condition worsens, affected individuals may develop arthritis, cirrhosis of the liver or liver cancer, diabetes, heart abnormalities, or skin discoloration. The appearance and severity of symptoms can be affected by environmental and lifestyle factors such as the amount of iron in the diet, alcohol use, and infections.  Hemochromatosis can be effectively treated via routine phlebotomy to reduce iron levels.  Jenny [name] was informed that there are four types of hereditary hemochromatosis which are classified depending on the age of onset, genetic cause, and mode of inheritance. Type I hemochromatosis is the most common type of the disorder. It is also one of the most common genetic conditions in the [place], affecting ~1 million people. This form begins in adulthood, with men developing symptoms between the ages of 34 and 15 and women developing symptoms after menopause. Types 2, 3, and 4 are rare and have only been studied in a small number of families worldwide. Type 2 begins in the juvenile period with symptoms in childhood. Type 3 has an intermediate onset with symptoms usually occurring under the age of 56. Like type 1, type 4 also begins in adulthood.  We discussed that hemochromatosis is most commonly caused by changes in the HFE gene. This is the gene associated with type I hemochromatosis. Other genes associated with hemochromatosis include HJV/HAMP (type 2), TFR2 (type 3), and SLC40A1 (type 4). These genes encode for proteins that regulate the absorption, transport, and storage of iron in the body. Pathogenic variants in these genes lead to impaired control of intestinal absorption of iron from foods during digestion and alter the distribution of iron to other parts of the body where it then accumulates. This causes the signs and symptoms of hemochromatosis.  Jenny [name] was counseled that hemochromatosis is most often inherited in an autosomal recessive fashion. This means that both parents must be carriers to be at risk of having a child affected by the condition. If both  parents are identified as carriers, they would have a 1 in 4 (25%) chance of having an affected child. Given that Ms. Augenstein's uncle is a carrier and his wife is affected by the condition, they had a 1 in 2 (50%) chance of having an affected  child rather than a 25% chance. Since Ms. Shuler's mother is a carrier for hemochromatosis, Ms. Barreira herself has a 50% chance of also being a carrier. If Ms. Hounshell's relatives have type I hemochromatosis and her father were also a carrier for that form, she would have a 1 in 4 (25%) chance of being affected by the condition. Based on the carrier frequency in the Caucasian population, Ms. Kitchings's father has a 1 in 3 chance of being a carrier for type I hemochromatosis. Thus, Ms. Dirusso currently has a 1 in 12 (8%) chance of having hemochromatosis herself.  Ms. Pilar JarvisCapamaggio was interested in undergoing carrier screening for hemochromatosis, as she was recently diagnosed with anemia during the pregnancy and was hesitant to begin treatment due to her family history. We reviewed that if she is a carrier, iron supplementation would not have any harmful effects. Additionally, even if someone is diagnosed with hemochromatosis, iron metabolism changes significantly in pregnancy and they may experience iron deficiency. It is recommended that iron deficiency in individuals with hemochromatosis during pregnancy be treated in a similar fashion to any other pregnant woman with anemia.    Testing options:  Ms. Pilar JarvisCapamaggio was interested in pursuing carrier screening for hemochromatosis. We discussed carrier screening for the HFE gene only vs carrier screening for this gene in addition to the HJV and TFR2 genes associated with the rarer recessive forms of the condition. Ms. Pilar JarvisCapamaggio elected to pursue carrier screening for each of these genes. If she is found to be a carrier, or in the event that she is found to be affected, partner carrier screening for hemochromatosis could be considered for Ms. Benish's husband. This would refine the chances for the couple's sons to be affected by the condition.   Ms. Pilar JarvisCapamaggio had a sample drawn for Horizon Basic carrier screening with her OBGYN provider last  week. We discussed that this panel does not include genes associated with hemochromatosis. However, during her appointment today, I received a message that the test was not able to be performed as the lab did not receive an adequate sample volume for the test. We discussed that Horizon carrier screening assesses carrier status for additional conditions recommended by ACOG, including cystic fibrosis, spinal muscular atrophy, and hemoglobinopathies. We reviewed information about these conditions, rationale for testing, and their autosomal recessive inheritance pattern. I offered carrier screening for these additional conditions, which Ms. Brake accepted at this time. Ms. Pilar JarvisCapamaggio was informed that select hemoglobinopathies, CF, and SMA are included on [place]'s newborn screen.   A sample was also drawn for Panorama noninvasive prenatal screening (NIPS) last week with Ms. Rabe's OBGYN provider. We reviewed that NIPS analyzes cell free DNA originating from the placenta that is found in the maternal blood circulation during pregnancy. This test is not diagnostic for chromosome conditions, but can provide information regarding the presence or absence of extra DNA for chromosomes 13, 18, 21, and the sex chromosomes. Thus, it would not identify or rule out all fetal aneuploidy or all genetic conditions. The reported detection rate is 95-99% for trisomies 21, 18, 13, and >70% for sex chromosome aneuploidies. The false positive rate is reported to be less than 0.1% for any of these conditions.  Results generally take approximately one week to be returned. Either myself or Ms. Giammarino's OBGYN provider will contact her once her NIPS results are available.  Finally, Ms. Balderston was counseled regarding diagnostic testing via amniocentesis. We discussed the technical aspects of the procedure and quoted up to a 1 in 500 (0.2%) risk for preterm labor or other adverse pregnancy outcomes as a result of  amniocentesis. Cultured cells from an amniocentesis sample allow for the visualization of a fetal karyotype, which can detect >99% of large chromosomal aberrations. Chromosomal microarray can also be performed to identify smaller deletions or duplications of fetal chromosomal material. Amniocentesis could also be performed to assess whether the baby is affected by hemochromatosis. However, given that it is most frequently an adult-onset condition, prenatal diagnostic testing for hemochromatosis may not be recommended. After careful consideration, Ms. Dragan declined amniocentesis at this time. She understands that amniocentesis is available at any point after 16 weeks of pregnancy and that she may opt to undergo the procedure at a later date should she change her mind.  Plan:  Ms. Maret had a sample drawn for carrier screening through the laboratory [facility] today. Carrier screening was ordered for the HFE, HJV, TFR2, CFTR, SMN1, HBB, and FMR1 genes (as FMR1 is included on [facility]'s Core Carrier Screening panel). Results will take 2-3 weeks to be returned. I will call Ms. Ivanov once results become available.   I counseled Ms. Arvie regarding the above risks and available options. The approximate face-to-face time with the genetic counselor was 70 minutes.  In summary:  Reviewed family history concerns  Family history of hemochromatosis in cousins. Mother and uncle are known carriers  50% chance of being carrier   Reviewed information about hemochromatosis, including possible features, treatment, causes, and inheritance   Discussed carrier screening options  Elected to have carrier screening for recessive hemochromatosis genes (HFE, HJV, TFR2) through [facility]  Also elected to have carrier screening for cystic fibrosis, spinal muscular atrophy, hemoglobinopathies, and fragile X syndrome. Sample previously drawn for Horizon carrier screening but failed. Carrier screening analysis  for these genes was added onto patient's carrier screen for hemochromatosis. We will follow results  Offered additional testing and screening  Panorama NIPS pending  Declined amniocentesis  Recommend fetal echocardiogram based on family history of congenital heart defects. We will place referral   Gershon Crane, MS, [facility] Genetic Counselor

## 2021-02-25 ENCOUNTER — Telehealth: Payer: Self-pay | Admitting: Genetic Counselor

## 2021-02-25 ENCOUNTER — Encounter: Payer: Self-pay | Admitting: Pediatric Cardiology

## 2021-02-25 NOTE — Telephone Encounter (Signed)
I called Ms. Hagey to discuss her negative noninvasive prenatal screening (NIPS) result. Specifically, Ms. Camuso had Panorama NIPS through the laboratory [facility]. Testing was offered by Ms. Henault's OBGYN provider because of advanced maternal age. We reviewed that these negative results demonstrated an expected representation of chromosome 21, 18, 13, and sex chromosome material, greatly reducing the likelihood of trisomies 58, 66, or 46 and sex chromosome aneuploidies for the pregnancy. Expected fetal sex was confirmed to be female.  NIPS analyzes placental DNA in maternal circulation. NIPS is considered to be highly specific and sensitive, but is not considered to be a diagnostic test. Ms. Matuska was previously counseled on the limitations and sensitivity of NIPS (see Genetic Counseling note from 02/17/21), which we reviewed again today. Diagnostic testing via amniocentesis is available should Ms. Trulson be interested in confirming this result.   I informed Ms. Rodger that her carrier screening results are in the analysis phase, so her results will likely be available earlier than anticipated. I will call her to discuss these once they are released. She confirmed that she had no further questions at this time.  Gershon Crane, MS, [facility] Genetic Counselor

## 2021-02-26 ENCOUNTER — Encounter: Payer: Self-pay | Admitting: *Deleted

## 2021-02-27 ENCOUNTER — Telehealth: Payer: Self-pay | Admitting: Genetic Counselor

## 2021-02-27 NOTE — Telephone Encounter (Signed)
Received a call back from Jenny [name] to discuss her carrier screening results. Jenny [name] opted to undergo carrier screening for hemochromatosis and several other conditions through the laboratory [facility] following her genetic counseling appointment on 02/17/21 (see Genetic Counseling note for more details). 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 Only a single pathogenic variant was identified, indicating that Jenny [name] is only a carrier for the condition and is not affected herself. Jenny [name] carrier screening results were negative for cystic fibrosis, spinal muscular atrophy, fragile X syndrome, and hemoglobinopathies, significantly reducing her chances of being a carrier for one of these conditions.  We discussed that partner carrier screening could be pursued for Jenny [name] to determine the chance for their sons to be affected by hemochromatosis. Based on the pan-ethnic carrier frequency for hemochromatosis, Jenny [name]'s [name] has a 1 in 4 (25%) chance of being a carrier. Thus, the chance for each of the couple's children to be affected is 1 in 16 (6%). Jenny [name] is going to discuss the option of carrier screening with [name] to see if he is interested. Given that he does not have health insurance, we discussed that he may qualify for free testing through [facility]'s Patient Assistance Program. The laboratory can mail him a saliva kit to collect a sample rather than him having to come in for a blood draw if he is interested in pursuing testing.   I encouraged Jenny [name] to follow her OBGYN provider's recommendations regarding treating her anemia during pregnancy. She agreed to contact me once she has had a chance to discuss her results with [name] and determine if he would like to proceed with carrier screening for  hemochromatosis himself. I will facilitate testing from there if desired. She confirmed that she had no further questions at this time.  Buelah Manis, MS, [facility] Genetic Counselor

## 2021-02-27 NOTE — Telephone Encounter (Signed)
LVM for Jenny [name] informing her that I was calling to review her carrier screening results. Requested a call back to my direct line to discuss these in detail.  Gershon Crane, MS, [facility] Genetic Counselor

## 2021-03-02 ENCOUNTER — Other Ambulatory Visit: Payer: Self-pay

## 2021-03-02 ENCOUNTER — Other Ambulatory Visit: Payer: Self-pay | Admitting: General Practice

## 2021-03-02 DIAGNOSIS — O0990 Supervision of high risk pregnancy, unspecified, unspecified trimester: Secondary | ICD-10-CM

## 2021-03-03 ENCOUNTER — Ambulatory Visit: Payer: Self-pay

## 2021-03-05 ENCOUNTER — Ambulatory Visit (INDEPENDENT_AMBULATORY_CARE_PROVIDER_SITE_OTHER): Payer: Medicaid Other | Admitting: Family Medicine

## 2021-03-05 ENCOUNTER — Other Ambulatory Visit: Payer: Medicaid Other

## 2021-03-05 ENCOUNTER — Encounter: Payer: Self-pay | Admitting: Family Medicine

## 2021-03-05 ENCOUNTER — Other Ambulatory Visit: Payer: Self-pay

## 2021-03-05 VITALS — BP 111/72 | HR 79 | Wt 211.0 lb

## 2021-03-05 DIAGNOSIS — Z6791 Unspecified blood type, Rh negative: Secondary | ICD-10-CM

## 2021-03-05 DIAGNOSIS — O0990 Supervision of high risk pregnancy, unspecified, unspecified trimester: Secondary | ICD-10-CM

## 2021-03-05 DIAGNOSIS — O26899 Other specified pregnancy related conditions, unspecified trimester: Secondary | ICD-10-CM | POA: Diagnosis not present

## 2021-03-05 DIAGNOSIS — Z23 Encounter for immunization: Secondary | ICD-10-CM | POA: Diagnosis not present

## 2021-03-05 MED ORDER — RHO D IMMUNE GLOBULIN 1500 UNIT/2ML IJ SOSY
300.0000 ug | PREFILLED_SYRINGE | Freq: Once | INTRAMUSCULAR | Status: AC
  Administered 2021-03-05: 300 ug via INTRAMUSCULAR

## 2021-03-05 NOTE — Progress Notes (Addendum)
PRENATAL VISIT NOTE  Subjective:  Jenny [name] is a 35 y.o. [id number] at [redacted]w[redacted]d being seen today for ongoing prenatal care.  She is currently monitored for the following issues for this high-risk pregnancy and has Late prenatal care; Rh negative state in antepartum period; Supervision of high risk pregnancy, antepartum; Hx of preeclampsia, prior pregnancy, currently pregnant, second trimester; Hx of postpartum hemorrhage, currently pregnant, second trimester; Low lying placenta nos or without hemorrhage, second trimester; and Family history of hemochromatosis on their problem list.  Patient reports no complaints. Occasional back pain.  Contractions: Not present. Vag. Bleeding: None.  Movement: Present. Denies leaking of fluid.   The following portions of the patient's history were reviewed and updated as appropriate: allergies, current medications, past family history, past medical history, past social history, past surgical history and problem list.   Objective:   Vitals:   03/05/21 0937  BP: 111/72  Pulse: 79  Weight: 211 lb (95.7 kg)    Fetal Status: Fetal Heart Rate (bpm): 143 Fundal Height: 29 cm Movement: Present     General:  Alert, oriented and cooperative. Patient is in no acute distress.  Skin: Skin is warm and dry. No rash noted.   Cardiovascular: Normal heart rate noted  Respiratory: Normal respiratory effort, no problems with respiration noted  Abdomen: Soft, gravid, appropriate for gestational age.  Pain/Pressure: Absent     Pelvic: Cervical exam deferred        Extremities: Normal range of motion.  Edema: None  Mental Status: Normal mood and affect. Normal behavior. Normal judgment and thought content.   Assessment and Plan:  Pregnancy: [id number] at [redacted]w[redacted]d 1. Supervision of high risk pregnancy, antepartum - Up to date - Updated OB hx--- patient has NEVER had a preterm birth and her twin delivery in 2019 was incorrectly put in as 27 wks. She confirmed they were 37  weeks NOT 27wk. I have changed this.  - FH appropriate  - reviewed [place] results - Tdap vaccine IM  2. Rh negative state in antepartum period - Reviewed record and I have not found a resulted antibody screen from initial prenatal visit at 25 weeks and the order for 5/26 is "active" but does not appear to have been released. I will follow up with clinical staff. This was noticed while I was documenting and updating the pregnancy box.  - rho (d) immune globulin (RHIG/RHOPHYLAC) injection 300 mcg  3. Scheduled third trimester care:  - 2hr GTT, HIV, RPR, CBC  Preterm labor symptoms and general obstetric precautions including but not limited to vaginal bleeding, contractions, leaking of fluid and fetal movement were reviewed in detail with the patient. Please refer to After Visit Summary for other counseling recommendations.   Return for Routine prenatal care, MD or APP.  Future Appointments  Date Time Provider Department Center  03/10/2021  2:45 PM [facility] NURSE [facility]  03/10/2021  3:00 PM [facility] US1 [facility]  03/19/2021  8:15 AM Glenwood Bing, MD [facility]   This visit and documentation was performed with the Assistance of MS3 E[place]ka Valdes [facility] of Attending Supervision of Student:  I confirm that I have verified the information documented in the medical student's note and that I have also personally reperformed the history, physical exam and all medical decision making activities.  I have verified that all services and findings are accurately documented in this student's note; and I agree with management and plan as outlined in the documentation.   Federico Flake,  MD

## 2021-03-06 LAB — RPR: RPR Ser Ql: NONREACTIVE

## 2021-03-06 LAB — CBC
Hematocrit: 35 % (ref 34.0–46.6)
Hemoglobin: 11.2 g/dL (ref 11.1–15.9)
MCH: 29.5 pg (ref 26.6–33.0)
MCHC: 32 g/dL (ref 31.5–35.7)
MCV: 92 fL (ref 79–97)
Platelets: 212 10*3/uL (ref 150–450)
RBC: 3.8 x10E6/uL (ref 3.77–5.28)
RDW: 13.2 % (ref 11.7–15.4)
WBC: 9 10*3/uL (ref 3.4–10.8)

## 2021-03-06 LAB — GLUCOSE TOLERANCE, 2 HOURS W/ 1HR
Glucose, 1 hour: 160 mg/dL (ref 65–179)
Glucose, 2 hour: 136 mg/dL (ref 65–152)
Glucose, Fasting: 90 mg/dL (ref 65–91)

## 2021-03-06 LAB — ABO/RH: Rh Factor: NEGATIVE

## 2021-03-06 LAB — HIV ANTIBODY (ROUTINE TESTING W REFLEX): HIV Screen 4th Generation wRfx: NONREACTIVE

## 2021-03-07 ENCOUNTER — Encounter: Payer: Self-pay | Admitting: Family Medicine

## 2021-03-10 ENCOUNTER — Telehealth: Payer: Self-pay | Admitting: *Deleted

## 2021-03-10 ENCOUNTER — Other Ambulatory Visit: Payer: Medicaid Other

## 2021-03-10 ENCOUNTER — Ambulatory Visit: Payer: Medicaid Other

## 2021-03-10 DIAGNOSIS — O0990 Supervision of high risk pregnancy, unspecified, unspecified trimester: Secondary | ICD-10-CM

## 2021-03-10 NOTE — Telephone Encounter (Signed)
-----   Message from Federico Flake, MD sent at 03/06/2021  1:41 PM EDT ----- 2 hr GTT WNL. Some evidence of insulin resistance with elevated 2 hr of 160. Counsel patient next visit about higher protein/lower carb diet. Updated pink sticky and ob box

## 2021-03-10 NOTE — Telephone Encounter (Signed)
I called [facility] and left a message I was calling with non-urgent information . Referral placed for diabetes educator re: diet. Sent message to registrar to schedule. Stephen Baruch,RN

## 2021-03-11 ENCOUNTER — Encounter: Payer: Self-pay | Admitting: *Deleted

## 2021-03-11 ENCOUNTER — Ambulatory Visit: Payer: Medicaid Other | Attending: Obstetrics and Gynecology

## 2021-03-11 ENCOUNTER — Other Ambulatory Visit: Payer: Self-pay

## 2021-03-11 ENCOUNTER — Ambulatory Visit: Payer: Medicaid Other | Admitting: *Deleted

## 2021-03-11 DIAGNOSIS — O0990 Supervision of high risk pregnancy, unspecified, unspecified trimester: Secondary | ICD-10-CM | POA: Diagnosis present

## 2021-03-11 DIAGNOSIS — Z362 Encounter for other antenatal screening follow-up: Secondary | ICD-10-CM | POA: Insufficient documentation

## 2021-03-11 NOTE — Telephone Encounter (Signed)
Called pt; results given. Pt states she will stop at front office today after [place] with MFM to schedule diabetes education appt.

## 2021-03-17 ENCOUNTER — Other Ambulatory Visit: Payer: Medicaid Other

## 2021-03-18 ENCOUNTER — Telehealth: Payer: Self-pay

## 2021-03-18 NOTE — Telephone Encounter (Signed)
FETAL ECHO COMPLETED 02/25/2021.

## 2021-03-19 ENCOUNTER — Encounter: Payer: Medicaid Other | Admitting: Obstetrics and Gynecology

## 2021-03-30 ENCOUNTER — Encounter: Payer: Self-pay | Admitting: Obstetrics and Gynecology

## 2021-03-30 ENCOUNTER — Other Ambulatory Visit: Payer: Self-pay

## 2021-03-30 ENCOUNTER — Ambulatory Visit (INDEPENDENT_AMBULATORY_CARE_PROVIDER_SITE_OTHER): Payer: Medicaid Other | Admitting: Obstetrics and Gynecology

## 2021-03-30 VITALS — BP 119/67 | HR 80 | Wt 214.6 lb

## 2021-03-30 DIAGNOSIS — O09292 Supervision of pregnancy with other poor reproductive or obstetric history, second trimester: Secondary | ICD-10-CM

## 2021-03-30 DIAGNOSIS — O0990 Supervision of high risk pregnancy, unspecified, unspecified trimester: Secondary | ICD-10-CM

## 2021-03-30 DIAGNOSIS — O4442 Low lying placenta NOS or without hemorrhage, second trimester: Secondary | ICD-10-CM

## 2021-03-30 DIAGNOSIS — O09523 Supervision of elderly multigravida, third trimester: Secondary | ICD-10-CM

## 2021-03-30 DIAGNOSIS — O26899 Other specified pregnancy related conditions, unspecified trimester: Secondary | ICD-10-CM

## 2021-03-30 DIAGNOSIS — O09529 Supervision of elderly multigravida, unspecified trimester: Secondary | ICD-10-CM | POA: Insufficient documentation

## 2021-03-30 DIAGNOSIS — Z6791 Unspecified blood type, Rh negative: Secondary | ICD-10-CM

## 2021-03-30 NOTE — Progress Notes (Signed)
Subjective:  Jenny [name] is a 35 y.o. [id number] at [redacted]w[redacted]d being seen today for ongoing prenatal care.  She is currently monitored for the following issues for this high-risk pregnancy and has Late prenatal care; Rh negative state in antepartum period; Supervision of high risk pregnancy, antepartum; Hx of preeclampsia, prior pregnancy, currently pregnant, second trimester; Hx of postpartum hemorrhage, currently pregnant, second trimester; Low lying placenta nos or without hemorrhage, second trimester; Family history of hemochromatosis; and AMA (advanced maternal age) multigravida 35+ on their problem list.  Patient reports general discomforts of pregnancy.  Contractions: Irritability. Vag. Bleeding: None.  Movement: Present. Denies leaking of fluid.   The following portions of the patient's history were reviewed and updated as appropriate: allergies, current medications, past family history, past medical history, past social history, past surgical history and problem list. Problem list updated.  Objective:   Vitals:   03/30/21 1430  BP: 119/67  Pulse: 80  Weight: 214 lb 9.6 oz (97.3 kg)    Fetal Status: Fetal Heart Rate (bpm): 152   Movement: Present     General:  Alert, oriented and cooperative. Patient is in no acute distress.  Skin: Skin is warm and dry. No rash noted.   Cardiovascular: Normal heart rate noted  Respiratory: Normal respiratory effort, no problems with respiration noted  Abdomen: Soft, gravid, appropriate for gestational age. Pain/Pressure: Present     Pelvic:  Cervical exam deferred        Extremities: Normal range of motion.  Edema: None  Mental Status: Normal mood and affect. Normal behavior. Normal judgment and thought content.   Urinalysis:      Assessment and Plan:  Pregnancy: [id number] at [redacted]w[redacted]d  1. Supervision of high risk pregnancy, antepartum Stable  2. Hx of preeclampsia, prior pregnancy, currently pregnant, second trimester BP stable No S/Sx at  present  3. Hx of postpartum hemorrhage, currently pregnant, second trimester Stable  4. Low lying placenta nos or without hemorrhage, second trimester Resolved on 03/11/2021 U/S 38 % growth, no further U/S   5. Rh negative state in antepartum period S/P Rhogam  6. Multigravida of advanced maternal age in third trimester LR NIPS  Preterm labor symptoms and general obstetric precautions including but not limited to vaginal bleeding, contractions, leaking of fluid and fetal movement were reviewed in detail with the patient. Please refer to After Visit Summary for other counseling recommendations.  Return in about 2 weeks (around 04/13/2021) for OB visit, face to face, any provider.   Hermina Staggers, MD

## 2021-03-30 NOTE — Patient Instructions (Signed)

## 2021-04-15 ENCOUNTER — Other Ambulatory Visit: Payer: Self-pay

## 2021-04-15 ENCOUNTER — Ambulatory Visit (INDEPENDENT_AMBULATORY_CARE_PROVIDER_SITE_OTHER): Payer: Medicaid Other | Admitting: Family Medicine

## 2021-04-15 ENCOUNTER — Encounter: Payer: Self-pay | Admitting: Family Medicine

## 2021-04-15 VITALS — BP 106/79 | HR 74 | Wt 219.0 lb

## 2021-04-15 DIAGNOSIS — Z6791 Unspecified blood type, Rh negative: Secondary | ICD-10-CM

## 2021-04-15 DIAGNOSIS — O0990 Supervision of high risk pregnancy, unspecified, unspecified trimester: Secondary | ICD-10-CM

## 2021-04-15 DIAGNOSIS — O09292 Supervision of pregnancy with other poor reproductive or obstetric history, second trimester: Secondary | ICD-10-CM

## 2021-04-15 DIAGNOSIS — Z8349 Family history of other endocrine, nutritional and metabolic diseases: Secondary | ICD-10-CM

## 2021-04-15 DIAGNOSIS — O09523 Supervision of elderly multigravida, third trimester: Secondary | ICD-10-CM

## 2021-04-15 DIAGNOSIS — O26899 Other specified pregnancy related conditions, unspecified trimester: Secondary | ICD-10-CM

## 2021-04-15 NOTE — Progress Notes (Signed)
   Subjective:  Jenny [name] is a 35 y.o. [id number] at [redacted]w[redacted]d being seen today for ongoing prenatal care.  She is currently monitored for the following issues for this high-risk pregnancy and has Late prenatal care; Rh negative state in antepartum period; Supervision of high risk pregnancy, antepartum; Hx of preeclampsia, prior pregnancy, currently pregnant, second trimester; Hx of postpartum hemorrhage, currently pregnant, second trimester; Low lying placenta nos or without hemorrhage, second trimester; Family history of hemochromatosis; and AMA (advanced maternal age) multigravida 35+ on their problem list.  Patient reports no complaints.  Contractions: Not present. Vag. Bleeding: None.  Movement: Present. Denies leaking of fluid.   The following portions of the patient's history were reviewed and updated as appropriate: allergies, current medications, past family history, past medical history, past social history, past surgical history and problem list. Problem list updated.  Objective:   Vitals:   04/15/21 0953  BP: 106/79  Pulse: 74  Weight: 219 lb (99.3 kg)    Fetal Status: Fetal Heart Rate (bpm): 131 Fundal Height: 34 cm Movement: Present     General:  Alert, oriented and cooperative. Patient is in no acute distress.  Skin: Skin is warm and dry. No rash noted.   Cardiovascular: Normal heart rate noted  Respiratory: Normal respiratory effort, no problems with respiration noted  Abdomen: Soft, gravid, appropriate for gestational age. Pain/Pressure: Absent     Pelvic: Vag. Bleeding: None     Cervical exam deferred        Extremities: Normal range of motion.  Edema: None  Mental Status: Normal mood and affect. Normal behavior. Normal judgment and thought content.   Urinalysis:      Assessment and Plan:  Pregnancy: [id number] at [redacted]w[redacted]d  1. Supervision of high risk pregnancy, antepartum BP and FHR normal FH normal   2. Rh negative state in antepartum period S/p rhogam  3. Hx  of preeclampsia, prior pregnancy, currently pregnant, second trimester On ASA  4. Hx of postpartum hemorrhage, currently pregnant, second trimester   5. Family history of hemochromatosis   6. Multigravida of advanced maternal age in third trimester   Preterm labor symptoms and general obstetric precautions including but not limited to vaginal bleeding, contractions, leaking of fluid and fetal movement were reviewed in detail with the patient. Please refer to After Visit Summary for other counseling recommendations.  Return in 2 weeks (on 04/29/2021) for ob visit.   Venora Maples, MD

## 2021-04-15 NOTE — Progress Notes (Signed)
No questions or concerns for today  Jenny [name], CMA

## 2021-04-15 NOTE — Patient Instructions (Signed)

## 2021-04-29 ENCOUNTER — Encounter: Payer: Medicaid Other | Admitting: Obstetrics and Gynecology

## 2021-04-29 ENCOUNTER — Encounter: Payer: Self-pay | Admitting: Family Medicine

## 2021-05-11 ENCOUNTER — Encounter: Payer: Medicaid Other | Admitting: Family Medicine

## 2021-05-15 ENCOUNTER — Other Ambulatory Visit: Payer: Self-pay

## 2021-05-15 ENCOUNTER — Ambulatory Visit (INDEPENDENT_AMBULATORY_CARE_PROVIDER_SITE_OTHER): Payer: Medicaid Other | Admitting: Family

## 2021-05-15 ENCOUNTER — Other Ambulatory Visit (HOSPITAL_COMMUNITY)
Admission: RE | Admit: 2021-05-15 | Discharge: 2021-05-15 | Disposition: A | Discharge status: Home / Self Care | Payer: Medicaid Other | Source: Ambulatory Visit | Attending: Obstetrics and Gynecology | Admitting: Obstetrics and Gynecology

## 2021-05-15 VITALS — BP 120/80 | HR 72 | Wt 224.5 lb

## 2021-05-15 DIAGNOSIS — O0990 Supervision of high risk pregnancy, unspecified, unspecified trimester: Secondary | ICD-10-CM | POA: Diagnosis not present

## 2021-05-15 DIAGNOSIS — O4442 Low lying placenta NOS or without hemorrhage, second trimester: Secondary | ICD-10-CM

## 2021-05-15 DIAGNOSIS — O09523 Supervision of elderly multigravida, third trimester: Secondary | ICD-10-CM

## 2021-05-15 NOTE — Progress Notes (Signed)
   PRENATAL VISIT NOTE  Subjective:  Jenny [name] is a 35 y.o. [id number] at [redacted]w[redacted]d being seen today for ongoing prenatal care.  She is currently monitored for the following issues for this high-risk pregnancy and has Late prenatal care; Rh negative state in antepartum period; Supervision of high risk pregnancy, antepartum; Hx of preeclampsia, prior pregnancy, currently pregnant, second trimester; Hx of postpartum hemorrhage, currently pregnant, second trimester; Low lying placenta nos or without hemorrhage, second trimester; Family history of hemochromatosis; and AMA (advanced maternal age) multigravida 35+ on their problem list.  Patient reports cough x 1 month; negative COVID at home.  Denies fever, SOB, or chest pain..  Contractions: Irritability. Vag. Bleeding: None.  Movement: Present. Denies leaking of fluid.   The following portions of the patient's history were reviewed and updated as appropriate: allergies, current medications, past family history, past medical history, past social history, past surgical history and problem list.   Objective:   Vitals:   05/15/21 1041  BP: 120/80  Pulse: 72  Weight: 224 lb 8 oz (101.8 kg)  O2Sat:  99%  Fetal Status: Fetal Heart Rate (bpm): 140 Fundal Height: 39 cm Movement: Present     General:  Alert, oriented and cooperative. Patient is in no acute distress.  Skin: Skin is warm and dry. No rash noted.   Cardiovascular: Normal heart rate noted  Respiratory: Normal respiratory effort, no problems with respiration noted; lungs CTA  Abdomen: Soft, gravid, appropriate for gestational age.  Pain/Pressure: Present     Pelvic: Cervical exam performed in the presence of a chaperone Dilation: 1 Effacement (%): Thick Station: -2  Extremities: Normal range of motion.  Edema: None  Mental Status: Normal mood and affect. Normal behavior. Normal judgment and thought content.   Assessment and Plan:  Pregnancy: [id number] at [redacted]w[redacted]d 1. Supervision of high risk  pregnancy, antepartum - GC/Chlamydia probe amp ([place])not at [facility] - Culture, beta strep (group b only) - Given cervical ripening information  2. Low lying placenta nos or without hemorrhage, second trimester - Resolved at 29 weeks  3. Multigravida of advanced maternal age in third trimester - No fetal testing indicated  Term labor symptoms and general obstetric precautions including but not limited to vaginal bleeding, contractions, leaking of fluid and fetal movement were reviewed in detail with the patient. Please refer to After Visit Summary for other counseling recommendations.   Return in about 1 week (around 05/22/2021).  No future appointments.  Eino Farber Eloisa Northern, CNM

## 2021-05-15 NOTE — Patient Instructions (Addendum)

## 2021-05-17 LAB — GC/CHLAMYDIA PROBE AMP (~~LOC~~) NOT AT ARMC
Chlamydia: NEGATIVE
Comment: NEGATIVE
Comment: NORMAL
Neisseria Gonorrhea: NEGATIVE

## 2021-05-19 LAB — CULTURE, BETA STREP (GROUP B ONLY): Strep Gp B Culture: NEGATIVE

## 2021-05-22 ENCOUNTER — Encounter (HOSPITAL_COMMUNITY): Payer: Self-pay | Admitting: Obstetrics & Gynecology

## 2021-05-22 ENCOUNTER — Other Ambulatory Visit: Payer: Self-pay

## 2021-05-22 ENCOUNTER — Ambulatory Visit (INDEPENDENT_AMBULATORY_CARE_PROVIDER_SITE_OTHER): Payer: Medicaid Other | Admitting: Obstetrics and Gynecology

## 2021-05-22 ENCOUNTER — Inpatient Hospital Stay (HOSPITAL_COMMUNITY)
Admission: AD | Admit: 2021-05-22 | Discharge: 2021-05-22 | Disposition: A | Discharge status: Home / Self Care | Payer: Medicaid Other | Attending: Obstetrics & Gynecology | Admitting: Obstetrics & Gynecology

## 2021-05-22 VITALS — BP 125/90 | HR 79 | Wt 224.3 lb

## 2021-05-22 DIAGNOSIS — O480 Post-term pregnancy: Secondary | ICD-10-CM | POA: Diagnosis not present

## 2021-05-22 DIAGNOSIS — O0933 Supervision of pregnancy with insufficient antenatal care, third trimester: Secondary | ICD-10-CM | POA: Diagnosis not present

## 2021-05-22 DIAGNOSIS — Z3A40 40 weeks gestation of pregnancy: Secondary | ICD-10-CM | POA: Diagnosis not present

## 2021-05-22 DIAGNOSIS — Z7982 Long term (current) use of aspirin: Secondary | ICD-10-CM | POA: Diagnosis not present

## 2021-05-22 DIAGNOSIS — O26899 Other specified pregnancy related conditions, unspecified trimester: Secondary | ICD-10-CM

## 2021-05-22 DIAGNOSIS — R030 Elevated blood-pressure reading, without diagnosis of hypertension: Secondary | ICD-10-CM | POA: Diagnosis not present

## 2021-05-22 DIAGNOSIS — O26893 Other specified pregnancy related conditions, third trimester: Secondary | ICD-10-CM | POA: Diagnosis not present

## 2021-05-22 DIAGNOSIS — Z6791 Unspecified blood type, Rh negative: Secondary | ICD-10-CM

## 2021-05-22 DIAGNOSIS — O09523 Supervision of elderly multigravida, third trimester: Secondary | ICD-10-CM | POA: Insufficient documentation

## 2021-05-22 DIAGNOSIS — O0990 Supervision of high risk pregnancy, unspecified, unspecified trimester: Secondary | ICD-10-CM | POA: Diagnosis not present

## 2021-05-22 DIAGNOSIS — Z8349 Family history of other endocrine, nutritional and metabolic diseases: Secondary | ICD-10-CM

## 2021-05-22 DIAGNOSIS — Z3689 Encounter for other specified antenatal screening: Secondary | ICD-10-CM | POA: Diagnosis not present

## 2021-05-22 DIAGNOSIS — O0930 Supervision of pregnancy with insufficient antenatal care, unspecified trimester: Secondary | ICD-10-CM

## 2021-05-22 DIAGNOSIS — O09293 Supervision of pregnancy with other poor reproductive or obstetric history, third trimester: Secondary | ICD-10-CM | POA: Insufficient documentation

## 2021-05-22 HISTORY — DX: Dermatitis, unspecified: L30.9

## 2021-05-22 HISTORY — DX: Anemia, unspecified: D64.9

## 2021-05-22 LAB — CBC
HCT: 34.9 % — ABNORMAL LOW (ref 36.0–46.0)
Hemoglobin: 11.8 g/dL — ABNORMAL LOW (ref 12.0–15.0)
MCH: 30.7 pg (ref 26.0–34.0)
MCHC: 33.8 g/dL (ref 30.0–36.0)
MCV: 90.9 fL (ref 80.0–100.0)
Platelets: 193 10*3/uL (ref 150–400)
RBC: 3.84 MIL/uL — ABNORMAL LOW (ref 3.87–5.11)
RDW: 14.8 % (ref 11.5–15.5)
WBC: 9.9 10*3/uL (ref 4.0–10.5)
nRBC: 0 % (ref 0.0–0.2)

## 2021-05-22 LAB — COMPREHENSIVE METABOLIC PANEL
ALT: 16 U/L (ref 0–44)
AST: 19 U/L (ref 15–41)
Albumin: 2.6 g/dL — ABNORMAL LOW (ref 3.5–5.0)
Alkaline Phosphatase: 99 U/L (ref 38–126)
Anion gap: 7 (ref 5–15)
BUN: 12 mg/dL (ref 6–20)
CO2: 18 mmol/L — ABNORMAL LOW (ref 22–32)
Calcium: 8.6 mg/dL — ABNORMAL LOW (ref 8.9–10.3)
Chloride: 111 mmol/L (ref 98–111)
Creatinine, Ser: 0.72 mg/dL (ref 0.44–1.00)
GFR, Estimated: >60 mL/min (ref >60)
Glucose, Bld: 76 mg/dL (ref 70–99)
Potassium: 3.7 mmol/L (ref 3.5–5.1)
Sodium: 136 mmol/L (ref 135–145)
Total Bilirubin: 0.7 mg/dL (ref 0.3–1.2)
Total Protein: 5.7 g/dL — ABNORMAL LOW (ref 6.5–8.1)

## 2021-05-22 LAB — PROTEIN / CREATININE RATIO, URINE
Creatinine, Urine: 16.08 mg/dL
Total Protein, Urine: <6 mg/dL

## 2021-05-22 LAB — URINALYSIS, ROUTINE W REFLEX MICROSCOPIC
Bilirubin Urine: NEGATIVE
Glucose, UA: NEGATIVE mg/dL
Hgb urine dipstick: NEGATIVE
Ketones, ur: NEGATIVE mg/dL
Leukocytes,Ua: NEGATIVE
Nitrite: NEGATIVE
Protein, ur: NEGATIVE mg/dL
Specific Gravity, Urine: 1.002 — ABNORMAL LOW (ref 1.005–1.030)
pH: 7 (ref 5.0–8.0)

## 2021-05-22 NOTE — Progress Notes (Signed)
   PRENATAL VISIT NOTE  Subjective:  Jenny [name] is a 35 y.o. [id number] at [redacted]w[redacted]d being seen today for ongoing prenatal care.  She is currently monitored for the following issues for this high-risk pregnancy and has Late prenatal care; Rh negative state in antepartum period; Supervision of high risk pregnancy, antepartum; Hx of preeclampsia, prior pregnancy, currently pregnant, third trimester; Hx of postpartum hemorrhage, currently pregnant, third trimester; Family history of hemochromatosis; AMA (advanced maternal age) multigravida 35+; and [redacted] weeks gestation of pregnancy on their problem list.  Patient doing well with no acute concerns today. She reports no complaints.  Contractions: Not present. Vag. Bleeding: None.  Movement: Present. Denies leaking of fluid.   The following portions of the patient's history were reviewed and updated as appropriate: allergies, current medications, past family history, past medical history, past social history, past surgical history and problem list. Problem list updated.  Objective:   Vitals:   05/22/21 1108  BP: 125/90  Pulse: 79  Weight: 224 lb 4.8 oz (101.7 kg)    Fetal Status: Fetal Heart Rate (bpm): 121 Fundal Height: 39 cm Movement: Present     General:  Alert, oriented and cooperative. Patient is in no acute distress.  Skin: Skin is warm and dry. No rash noted.   Cardiovascular: Normal heart rate noted  Respiratory: Normal respiratory effort, no problems with respiration noted  Abdomen: Soft, gravid, appropriate for gestational age.  Pain/Pressure: Present     Pelvic: Cervical exam performed Dilation: 1.5 Effacement (%): 80 Station: -2  Extremities: Normal range of motion.  Edema: None  Mental Status:  Normal mood and affect. Normal behavior. Normal judgment and thought content.   Assessment and Plan:  Pregnancy: [id number] at [redacted]w[redacted]d  1. Rh negative state in antepartum period Treat after delivery  2. Multigravida of advanced maternal  age in third trimester   3. Supervision of high risk pregnancy, antepartum Pt to MAU for extended monitoring for FHT 117-121 at term, diastolic BP slightly increased, if discharged home will schedule IOL for early next week  4. Late prenatal care   5. Hx of preeclampsia, prior pregnancy, currently pregnant, third trimester Diastolic BP is 90, no other symptoms  6. Family history of hemochromatosis   7. [redacted] weeks gestation of pregnancy IOL scheduled if undelivered  Term labor symptoms and general obstetric precautions including but not limited to vaginal bleeding, contractions, leaking of fluid and fetal movement were reviewed in detail with the patient.  Please refer to After Visit Summary for other counseling recommendations.   No follow-ups on file.   Mariel Aloe, MD Faculty Attending [facility] for [facility]

## 2021-05-22 NOTE — MAU Provider Note (Signed)
History     CSN: [id number]  Arrival date and time: 05/22/21 1453   Event Date/Time   First Provider Initiated Contact with Patient 05/22/21 1549      Chief Complaint  Patient presents with   Hypertension   35 y.o. [id number] @40 .0 weeks send from office for low FHR baseline and elevated BP. Denies HA, visual disturbances, RUQ pain, SOB, and CP. Reports good FM. Pregnancy complicated by AMA, hx PEC in previous pregnancy, and late prenatal care.      OB History     Gravida  5   Para  3   Term  3   Preterm  0   AB  1   Living  4      SAB  1   IAB  0   Ectopic  0   Multiple  1   Live Births  4           Past Medical History:  Diagnosis Date   Anemia    Eczema    Mild preeclampsia, third trimester 08/01/2018   Postpartum hemorrhage - 3400 cc 08/06/2018   Shortness of breath    when running   Urinary tract infection    Vaginal Pap smear, abnormal     Past Surgical History:  Procedure Laterality Date   LIP REPAIR      Family History  Problem Relation Age of Onset   Hypertension Mother    Heart disease Mother        arrythmia- 'heart skipping'   Thyroid disease Mother    Healthy Father    Stroke Maternal Grandmother    Stroke Maternal Grandfather    Stroke Paternal Grandmother    Diabetes Paternal Grandmother    Diabetes Paternal Grandfather     Social History   Tobacco Use   Smoking status: Never   Smokeless tobacco: Never  Vaping Use   Vaping Use: Never used  Substance Use Topics   Alcohol use: No   Drug use: No    Allergies:  Allergies  Allergen Reactions   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 30 tablet 6 05/21/2021   Prenatal Vit-Fe Fumarate-FA (PRENATAL MULTIVITAMIN) TABS tablet Take 1 tablet by mouth daily at 12 noon.   05/22/2021   Probiotic  Product (PROBIOTIC PO) Take 1 capsule by mouth daily.   05/22/2021   Blood Pressure Monitoring (BLOOD PRESSURE KIT) DEVI 1 Device by Does not apply route as needed. 1 each 0     Review of Systems  Respiratory:  Negative for shortness of breath.   Cardiovascular:  Negative for chest pain.  Gastrointestinal:  Negative for abdominal pain.  Genitourinary:  Negative for vaginal bleeding.  Neurological:  Negative for headaches.  Physical Exam   Blood pressure 119/77, pulse 72, temperature 97.9 F (36.6 C), temperature source Oral, resp. rate 18, height 5' 5"  (1.651 m), weight 102.5 kg, last menstrual period 08/15/2020, SpO2 98 %, unknown if currently breastfeeding. Patient Vitals for the past 24 hrs:  BP Temp Temp src Pulse Resp SpO2 Height Weight  05/22/21 1631 119/77 -- -- 72 -- -- -- --  05/22/21 1616 120/73 -- -- 76 -- -- -- --  05/22/21 1602 123/78 -- -- 76 -- -- -- --  05/22/21 1545 121/74 -- -- 70 -- 98 % -- --  05/22/21  1543 122/76 -- -- 67 -- -- -- --  05/22/21 1540 -- -- -- -- -- 98 % -- --  05/22/21 1518 120/75 97.9 F (36.6 C) Oral 77 18 100 % 5' 5"  (1.651 m) 102.5 kg   Physical Exam Vitals and nursing note reviewed.  Constitutional:      General: She is not in acute distress.    Appearance: Normal appearance.  HENT:     Head: Normocephalic and atraumatic.  Cardiovascular:     Rate and Rhythm: Normal rate.  Pulmonary:     Effort: Pulmonary effort is normal. No respiratory distress.  Musculoskeletal:        General: Normal range of motion.     Cervical back: Normal range of motion.  Neurological:     General: No focal deficit present.     Mental Status: She is alert and oriented to person, place, and time.  Psychiatric:        Mood and Affect: Mood normal.        Behavior: Behavior normal.  EFM: 125 bpm, mod variability, + accels, no decels Toco: rare  Results for orders placed or performed during the hospital encounter of 05/22/21 (from the past 24 hour(s))  CBC      Status: Abnormal   Collection Time: 05/22/21  3:56 PM  Result Value Ref Range   WBC 9.9 4.0 - 10.5 K/uL   RBC 3.84 (L) 3.87 - 5.11 MIL/uL   Hemoglobin 11.8 (L) 12.0 - 15.0 g/dL   HCT 34.9 (L) 36.0 - 46.0 %   MCV 90.9 80.0 - 100.0 fL   MCH 30.7 26.0 - 34.0 pg   MCHC 33.8 30.0 - 36.0 g/dL   RDW 14.8 11.5 - 15.5 %   Platelets 193 150 - 400 K/uL   nRBC 0.0 0.0 - 0.2 %  Comprehensive metabolic panel     Status: Abnormal   Collection Time: 05/22/21  3:56 PM  Result Value Ref Range   Sodium 136 135 - 145 mmol/L   Potassium 3.7 3.5 - 5.1 mmol/L   Chloride 111 98 - 111 mmol/L   CO2 18 (L) 22 - 32 mmol/L   Glucose, Bld 76 70 - 99 mg/dL   BUN 12 6 - 20 mg/dL   Creatinine, Ser 0.72 0.44 - 1.00 mg/dL   Calcium 8.6 (L) 8.9 - 10.3 mg/dL   Total Protein 5.7 (L) 6.5 - 8.1 g/dL   Albumin 2.6 (L) 3.5 - 5.0 g/dL   AST 19 15 - 41 U/L   ALT 16 0 - 44 U/L   Alkaline Phosphatase 99 38 - 126 U/L   Total Bilirubin 0.7 0.3 - 1.2 mg/dL   GFR, Estimated >60 >60 mL/min   Anion gap 7 5 - 15  Urinalysis, Routine w reflex microscopic Urine, Clean Catch     Status: Abnormal   Collection Time: 05/22/21  4:18 PM  Result Value Ref Range   Color, Urine STRAW (A) YELLOW   APPearance CLEAR CLEAR   Specific Gravity, Urine 1.002 (L) 1.005 - 1.030   pH 7.0 5.0 - 8.0   Glucose, UA NEGATIVE NEGATIVE mg/dL   Hgb urine dipstick NEGATIVE NEGATIVE   Bilirubin Urine NEGATIVE NEGATIVE   Ketones, ur NEGATIVE NEGATIVE mg/dL   Protein, ur NEGATIVE NEGATIVE mg/dL   Nitrite NEGATIVE NEGATIVE   Leukocytes,Ua NEGATIVE NEGATIVE  Protein / creatinine ratio, urine     Status: None   Collection Time: 05/22/21  4:18 PM  Result Value Ref Range  Creatinine, Urine 16.08 mg/dL   Total Protein, Urine <6.0 mg/dL   Protein Creatinine Ratio        0.00 - 0.15 mg/mg[Cre]   MAU Course  Procedures  MDM Labs ordered and reviewed. No signs of PEC. Plan for IOL in 4 days-scheduled. Stable for discharge home.   Assessment and Plan    1. [redacted] weeks gestation of pregnancy   2. 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   Blood Pressure Kit Devi 1 Device by Does not apply route as needed.   prenatal multivitamin Tabs tablet Take 1 tablet by mouth daily at 12 noon.   PROBIOTIC PO Take 1 capsule by mouth daily.       Julianne Handler, CNM 05/22/2021, 5:20 PM

## 2021-05-22 NOTE — MAU Note (Signed)
BP was high in the office. Sent in for further eval. BP was lower 117, so wanted to her to get monitored.  Denies HA, visual changes, epigastric pain or increase in swelling. Denies pain, bleeding or leaking.

## 2021-05-22 NOTE — Progress Notes (Signed)
Group beta strep normal.  No further action needed.

## 2021-05-26 ENCOUNTER — Inpatient Hospital Stay (HOSPITAL_COMMUNITY): Payer: Medicaid Other

## 2021-05-26 ENCOUNTER — Encounter (HOSPITAL_COMMUNITY): Payer: Self-pay | Admitting: Obstetrics and Gynecology

## 2021-05-26 ENCOUNTER — Observation Stay (HOSPITAL_COMMUNITY)
Admission: AD | Admit: 2021-05-26 | Discharge: 2021-05-26 | Disposition: A | Discharge status: Home / Self Care | Payer: Medicaid Other | Attending: Obstetrics and Gynecology | Admitting: Obstetrics and Gynecology

## 2021-05-26 ENCOUNTER — Other Ambulatory Visit: Payer: Self-pay

## 2021-05-26 DIAGNOSIS — Z3493 Encounter for supervision of normal pregnancy, unspecified, third trimester: Principal | ICD-10-CM | POA: Insufficient documentation

## 2021-05-26 DIAGNOSIS — O09293 Supervision of pregnancy with other poor reproductive or obstetric history, third trimester: Secondary | ICD-10-CM

## 2021-05-26 DIAGNOSIS — Z20822 Contact with and (suspected) exposure to covid-19: Secondary | ICD-10-CM | POA: Diagnosis not present

## 2021-05-26 DIAGNOSIS — Z3A40 40 weeks gestation of pregnancy: Secondary | ICD-10-CM | POA: Diagnosis not present

## 2021-05-26 DIAGNOSIS — O480 Post-term pregnancy: Secondary | ICD-10-CM | POA: Diagnosis present

## 2021-05-26 HISTORY — DX: Dyspnea, unspecified: R06.00

## 2021-05-26 HISTORY — DX: Gestational (pregnancy-induced) hypertension without significant proteinuria, unspecified trimester: O13.9

## 2021-05-26 LAB — CBC
HCT: 35.9 % — ABNORMAL LOW (ref 36.0–46.0)
Hemoglobin: 12 g/dL (ref 12.0–15.0)
MCH: 30.9 pg (ref 26.0–34.0)
MCHC: 33.4 g/dL (ref 30.0–36.0)
MCV: 92.5 fL (ref 80.0–100.0)
Platelets: 196 10*3/uL (ref 150–400)
RBC: 3.88 MIL/uL (ref 3.87–5.11)
RDW: 15.1 % (ref 11.5–15.5)
WBC: 9.6 10*3/uL (ref 4.0–10.5)
nRBC: 0 % (ref 0.0–0.2)

## 2021-05-26 LAB — TYPE AND SCREEN
ABO/RH(D): B NEG
Antibody Screen: NEGATIVE

## 2021-05-26 LAB — SARS CORONAVIRUS 2 (TAT 6-24 HRS): SARS Coronavirus 2: NEGATIVE

## 2021-05-26 MED ORDER — LACTATED RINGERS IV SOLN: INTRAVENOUS | Status: DC

## 2021-05-26 MED ORDER — ACETAMINOPHEN 325 MG PO TABS: 650.0000 mg | ORAL_TABLET | ORAL | Status: DC | PRN

## 2021-05-26 MED ORDER — TERBUTALINE SULFATE 1 MG/ML IJ SOLN: 0.2500 mg | Freq: Once | INTRAMUSCULAR | Status: DC | PRN

## 2021-05-26 MED ORDER — LIDOCAINE HCL (PF) 1 % IJ SOLN: 30.0000 mL | INTRAMUSCULAR | Status: DC | PRN

## 2021-05-26 MED ORDER — MISOPROSTOL 50MCG HALF TABLET
50.0000 ug | ORAL_TABLET | ORAL | Status: DC | PRN
  Administered 2021-05-26: 50 ug via BUCCAL
  Filled 2021-05-26: qty 1

## 2021-05-26 MED ORDER — ZOLPIDEM TARTRATE 5 MG PO TABS: 5.0000 mg | ORAL_TABLET | Freq: Every evening | ORAL | Status: DC | PRN

## 2021-05-26 MED ORDER — OXYTOCIN-SODIUM CHLORIDE 30-0.9 UT/500ML-% IV SOLN: 2.5000 [IU]/h | INTRAVENOUS | Status: DC

## 2021-05-26 MED ORDER — OXYTOCIN BOLUS FROM INFUSION: 333.0000 mL | Freq: Once | INTRAVENOUS | Status: DC

## 2021-05-26 MED ORDER — FENTANYL CITRATE (PF) 100 MCG/2ML IJ SOLN
50.0000 ug | INTRAMUSCULAR | Status: DC | PRN
Start: 2021-05-26 — End: 2021-05-27

## 2021-05-26 MED ORDER — SOD CITRATE-CITRIC ACID 500-334 MG/5ML PO SOLN: 30.0000 mL | ORAL | Status: DC | PRN

## 2021-05-26 MED ORDER — OXYTOCIN-SODIUM CHLORIDE 30-0.9 UT/500ML-% IV SOLN: 1.0000 m[IU]/min | INTRAVENOUS | Status: DC

## 2021-05-26 MED ORDER — OXYCODONE-ACETAMINOPHEN 5-325 MG PO TABS: 1.0000 | ORAL_TABLET | ORAL | Status: DC | PRN

## 2021-05-26 MED ORDER — ONDANSETRON HCL 4 MG/2ML IJ SOLN: 4.0000 mg | Freq: Four times a day (QID) | INTRAMUSCULAR | Status: DC | PRN

## 2021-05-26 MED ORDER — LACTATED RINGERS IV SOLN: 500.0000 mL | INTRAVENOUS | Status: DC | PRN

## 2021-05-26 NOTE — Progress Notes (Signed)
Patient ID: Jenny [name], female   DOB: 1986-03-26, 35 y.o.   MRN: [id number]  S:   [id number]  at 40w4 by unsure LMP with IOL scheduled for postdates with hx significant for preeclampsia, PPH x 2 who is s/p Cytotec buccal x 1 dose.  She is concerned about a long induction, which she had with previous pregnancies, and has questions about her due date and reasons for induction today.  She would prefer to go home if no medical need for induction is present and return at a later date for induction or come in actively laboring.    On chart review, pt EDD was set by a very unsure LMP. Pt reports "I didn't know I was pregnant so I had spotting off and on all the time. I just guessed at a date for the last period."  Her 25 week [place] makes her EDD 05/25/21.  EDD was initially set at 05/22/21 by LMP.  Pt presented to prenatal visit on 05/22/21 and had a BP of 125/90 and low baseline on 40 week NST. She was sent to MAU where she had multiple grossly normal BPs, no s/sx of PEC and normal FHR tracing.  She was scheduled for IOL today at [redacted]w[redacted]d.  Consult Dr Debroah Loop to review chart, discuss patient preferences, and options.  Given that patient does not meet criteria for GHTN or PEC, and is technically not postdates, especially if EDD is adjusted to [place] dating, and pt desires discharge from hospital, it is reasonable to offer discharge with close outpatient follow up and reschedule IOL for 41 weeks.    O: BP 121/82 (BP Location: Right Arm)   Pulse 81   Temp (!) 97.4 F (36.3 C) (Oral)   Resp 14   Ht 5\' 5"  (1.651 m)   Wt 103.6 kg   LMP  (Approximate)   BMI 38.02 kg/m  VS reviewed, nursing note reviewed,  Constitutional: well developed, well nourished, no distress HEENT: normocephalic CV: normal rate Pulm/chest wall: normal effort Abdomen: soft Neuro: alert and oriented x 3 Skin: warm, dry Psych: affect normal  Dilation: 1.5 Effacement (%): 50 Cervical Position: Posterior Station: , -3 Presentation:  Vertex Exam by:: Leftwich-kirby, CNM  Category I FHR tracing, baseline 135 with moderate variability, accels present, no decels Contractions irregular on toco, mild to palpation  A: [id number]  at [redacted]w[redacted]d  by 25 week [redacted]w[redacted]d GBS negative Hx PEC in previous pregnancy Isolated elevated BP this pregnancy  P: Membranes swept at pt request, pt cervix 1.5/50/-2 PEC precautions reviewed and pt discharged in stable condition.  Message sent to set up NST/prenatal appt this week, IOL scheduled for midnight 08/31/21.     09/02/21, CNM 11:14 PM

## 2021-05-26 NOTE — H&P (Addendum)
OBSTETRIC ADMISSION HISTORY AND PHYSICAL  Jenny [name] is a 35 y.o. female [phone] with IUP at 70w4dby ultrasound presenting for post-dates induction of labor. She reports +FMs, No LOF, no VB, no blurry vision, headaches or peripheral edema, and RUQ pain.  She plans on breast feeding. She is undecided birth control. [facility]  Dating: By ultrasound --->  Estimated Date of Delivery: 05/22/21  Sono:   @[redacted]w[redacted]d , CWD, normal anatomy, cephalic presentation, 8353G 37% EFW   Prenatal History/Complications: No complications this pregnancy. History of postpartum hemorrhage x2, family history of hemochromatosis, family history of congenital heart defect but fetal echo this pregnancy was normal.   Past Medical History: Past Medical History:  Diagnosis Date   Anemia    Dyspnea    Eczema    Mild preeclampsia, third trimester 08/01/2018   Postpartum hemorrhage - 3400 cc 08/06/2018   Pregnancy induced hypertension    Urinary tract infection    Vaginal Pap smear, abnormal     Past Surgical History: Past Surgical History:  Procedure Laterality Date   LIP REPAIR      Obstetrical History: OB History     Gravida  5   Para  3   Term  3   Preterm  0   AB  1   Living  4      SAB  1   IAB  0   Ectopic  0   Multiple  1   Live Births  4           Social History Social History   Socioeconomic History   Marital status: Married    Spouse name: Jenny [name]  Number of children: Not on file   Years of education: Not on file   Highest education level: Not on file  Occupational History   Not on file  Tobacco Use   Smoking status: Never   Smokeless tobacco: Never  Vaping Use   Vaping Use: Never used  Substance and Sexual Activity   Alcohol use: No   Drug use: No   Sexual activity: Yes    Birth control/protection: None  Other Topics Concern   Not on file  Social History Narrative   Not on file   Social Determinants of Health    Financial Resource Strain: Not on file  Food Insecurity: No Food Insecurity   Worried About Running Out of Food in the Last Year: Never true   RArchboldin the Last Year: Never true  Transportation Needs: No Transportation Needs   Lack of Transportation (Medical): No   Lack of Transportation (Non-Medical): No  Physical Activity: Not on file  Stress: Not on file  Social Connections: Not on file    Family History: Family History  Problem Relation Age of Onset   Hypertension Mother    Heart disease Mother        arrythmia- 'heart skipping'   Thyroid disease Mother    Healthy Father    Stroke Maternal Grandmother    Stroke Maternal Grandfather    Stroke Paternal Grandmother    Diabetes Paternal Grandmother    Diabetes Paternal Grandfather     Allergies: Allergies  Allergen Reactions   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 30 tablet 6 05/26/2021   Prenatal Vit-Fe Fumarate-FA (  PRENATAL MULTIVITAMIN) TABS tablet Take 1 tablet by mouth daily at 12 noon.   05/26/2021   Probiotic Product (PROBIOTIC PO) Take 1 capsule by mouth daily.   05/26/2021   Blood Pressure Monitoring (BLOOD PRESSURE KIT) DEVI 1 Device by Does not apply route as needed. 1 each 0      Review of Systems   All systems reviewed and negative except as stated in HPI   Blood pressure 118/80, pulse 75, temperature 98.2 F (36.8 C), temperature source Oral, resp. rate 18, height 5' 5"  (1.651 m), weight 103.6 kg, last menstrual period 08/15/2020, unknown if currently breastfeeding.  General appearance: alert, appears stated age, and no distress Lungs: breathing comfortably on room air CV: regular rate LE: No edema NEFG Presentation: cephalic  SVE: 6.2/69/-4, ballotable  Fetal monitoringBaseline: 135 bpm, Variability: Good {>  6 bpm), and Accelerations: Reactive Uterine activity: rare contractions on monitor      Prenatal labs: ABO, Rh: --/--/B NEG (08/16 1318) Antibody: NEG (08/16 1318) Rubella: 1.76 (05/05 1010) RPR: Non Reactive (05/26 0907)  HBsAg: Negative (05/05 1010)  HIV: Non Reactive (05/26 0907)  GBS: Negative/-- (08/05 1048)  1 hr Glucola WNL Genetic screening  low risk NIPS Anatomy US WNL, fetal echo normal  Prenatal Transfer Tool  Maternal Diabetes: No Genetic Screening: Normal Maternal Ultrasounds/Referrals: Normal Fetal Ultrasounds or other Referrals:  Fetal echo Maternal Substance Abuse:  No Significant Maternal Medications:  None Significant Maternal Lab Results: Group B Strep negative and Rh negative  No results found for this or any previous visit (from the past 24 hour(s)).  Patient Active Problem List   Diagnosis Date Noted   Post term pregnancy over 40 weeks 05/26/2021   [redacted] weeks gestation of pregnancy 05/22/2021   AMA (advanced maternal age) multigravida 35+ 03/30/2021   Supervision of high risk pregnancy, antepartum 02/12/2021   Hx of preeclampsia, prior pregnancy, currently pregnant, third trimester 02/12/2021   Hx of postpartum hemorrhage, currently pregnant, third trimester 02/12/2021   Family history of hemochromatosis 02/12/2021   Rh negative state in antepartum period 11/03/2012   Late prenatal care 11/02/2012    Assessment/Plan:  Jenny [name] is a 35 y.o. [id number] at 76w4dhere for post-dates induction of labor.  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 I was immediately available for direct supervision, assistance and direction throughout this encounter.  I also confirm that I have verified the information documented in the resident's note, and that I have also personally reperformed the pertinent components of the physical exam and all of the medical decision making activities.    EKatherine Basset DDeseretfor [employer] [facility] 05/26/2021  4:08 PM

## 2021-05-26 NOTE — Discharge Instructions (Signed)
Things to Try After 37 weeks to Encourage Labor/Get Ready for Labor:    Try the Miles Circuit at www.milescircuit.com daily to improve baby's position and encourage the onset of labor.  Walk a little and rest a little every day.  Change positions often.  Cervical Ripening: May try one or both Red Raspberry Leaf capsules or tea:  two 300mg or 400mg tablets with each meal, 2-3 times a day, or 1-3 cups of tea daily  Potential Side Effects Of Raspberry Leaf:  Most women do not experience any side effects from drinking raspberry leaf tea. However, nausea and loose stools are possible   Evening Primrose Oil capsules: take 1 capsule by mouth and place one capsule in the vagina every night.    Some of the potential side effects:  Upset stomach  Loose stools or diarrhea  Headaches  Nausea  Sex can also help the cervix ripen and encourage labor onset.    Labor Precautions Reasons to come to MAU at [facility]:  1.  Contractions are  5 minutes apart or less, each last 1 minute, these have been going on for 1-2 hours, and you cannot walk or talk during them 2.  You have a large gush of fluid, or a trickle of fluid that will not stop and you have to wear a pad 3.  You have bleeding that is bright red, heavier than spotting--like menstrual bleeding (spotting can be normal in early labor or after a check of your cervix) 4.  You do not feel the baby moving like he/she normally does  

## 2021-05-27 ENCOUNTER — Other Ambulatory Visit: Payer: Self-pay | Admitting: Advanced Practice Midwife

## 2021-05-27 ENCOUNTER — Encounter: Payer: Medicaid Other | Admitting: Certified Nurse Midwife

## 2021-05-27 ENCOUNTER — Telehealth (HOSPITAL_COMMUNITY): Payer: Self-pay | Admitting: *Deleted

## 2021-05-27 LAB — RPR: RPR Ser Ql: NONREACTIVE

## 2021-05-27 NOTE — Telephone Encounter (Signed)
Preadmission screen  

## 2021-05-28 ENCOUNTER — Telehealth (HOSPITAL_COMMUNITY): Payer: Self-pay | Admitting: *Deleted

## 2021-05-28 NOTE — Telephone Encounter (Signed)
Preadmission screen  

## 2021-05-31 ENCOUNTER — Inpatient Hospital Stay (HOSPITAL_COMMUNITY): Payer: Medicaid Other | Attending: Obstetrics and Gynecology

## 2021-05-31 ENCOUNTER — Inpatient Hospital Stay (HOSPITAL_COMMUNITY)
Admission: AD | Admit: 2021-05-31 | Payer: Medicaid Other | Source: Home / Self Care | Admitting: Obstetrics and Gynecology

## 2021-06-01 ENCOUNTER — Telehealth: Payer: Self-pay | Admitting: Advanced Practice Midwife

## 2021-06-01 NOTE — Telephone Encounter (Signed)
TC to patient. She was scheduled for IOL yesterday, but nurses have not been able to get in contact with her. She states that she has had trouble with her phone and she has not been able to call us back. She does not want to have IOL at this time. She is concerned about possible complications related to induction. Reviewed with patient IOL options and concern for now being 1 week over due date. She is agreeable to coming to MAU tomorrow 06/02/21 for NST. She reports normal fetal movement. She denies vaginal bleeding or LOF.   Thressa Sheller DNP, CNM  06/01/21  6:56 PM

## 2021-06-02 ENCOUNTER — Other Ambulatory Visit: Payer: Self-pay | Admitting: Advanced Practice Midwife

## 2021-06-02 ENCOUNTER — Encounter (HOSPITAL_COMMUNITY): Payer: Self-pay | Admitting: Obstetrics and Gynecology

## 2021-06-02 ENCOUNTER — Inpatient Hospital Stay (HOSPITAL_COMMUNITY)
Admission: AD | Admit: 2021-06-02 | Discharge: 2021-06-02 | Disposition: A | Discharge status: Home / Self Care | Payer: Medicaid Other | Attending: Obstetrics and Gynecology | Admitting: Obstetrics and Gynecology

## 2021-06-02 DIAGNOSIS — Z3689 Encounter for other specified antenatal screening: Secondary | ICD-10-CM | POA: Insufficient documentation

## 2021-06-02 DIAGNOSIS — O480 Post-term pregnancy: Secondary | ICD-10-CM | POA: Diagnosis not present

## 2021-06-02 DIAGNOSIS — O26893 Other specified pregnancy related conditions, third trimester: Secondary | ICD-10-CM | POA: Diagnosis present

## 2021-06-02 DIAGNOSIS — Z3A41 41 weeks gestation of pregnancy: Secondary | ICD-10-CM | POA: Diagnosis not present

## 2021-06-02 DIAGNOSIS — O0990 Supervision of high risk pregnancy, unspecified, unspecified trimester: Secondary | ICD-10-CM

## 2021-06-02 NOTE — MAU Note (Signed)
Was in for induction on Mon, no response to the medications so they let her go.  Was rescheduled for induction this week. Wanting to go natural. Not having any problems.  BP has been fine. Was told to come in to check on baby.  Feeling tightness, but no pain. No bleeding no leaking, +FM reported.

## 2021-06-02 NOTE — MAU Provider Note (Signed)
Event Date/Time   First Provider Initiated Contact with Patient 06/02/21 1749       S: Ms. Jenny [name] is a 35 y.o. [id number] at [redacted]w[redacted]d  who presents to MAU today for NST. She was admitted for IOL on 08/16 then requested discharge home due to lack of medical indication for induction and her desire for spontaneous labor. She did not report for her rescheduled induction yesterday. She reports occasional contractions. She denies vaginal bleeding. She denies LOF. She reports normal fetal movement.    O: BP 130/78   Pulse 66   Temp 97.8 F (36.6 C) (Oral)   Resp 16   Ht 5\' 5"  (1.651 m)   Wt 102.5 kg   LMP  (Approximate)   SpO2 99%   BMI 37.61 kg/m    GENERAL: Well-developed, well-nourished female in no acute distress.  HEAD: Normocephalic, atraumatic.  CHEST: Normal effort of breathing, regular heart rate ABDOMEN: Soft, nontender, gravid  Cervical exam: 1/50/-3, Cephalic by suture  Fetal Monitoring: Baseline: 135 Variability: Mod Accelerations: 15 x 15 Decelerations: None Contractions: Irregular, not consistently felt by patient   A: SIUP at [redacted]w[redacted]d  Reactive tracing Patient verbally consented to membrane sweep Patient agreeable to scheduling PDIOL at 41+6. Coordinated with L&D  P: Discharge home in stable condition  [redacted]w[redacted]d, CNM

## 2021-06-02 NOTE — Discharge Summary (Signed)
Patient ID: Jenny [name], female   DOB: [birth year], 35 y.o.   MRN: [id number]   S:   [id number]  at 40w4 by unsure LMP with IOL scheduled for postdates with hx significant for preeclampsia, PPH x 2 who is s/p Cytotec buccal x 1 dose.  She is concerned about a long induction, which she had with previous pregnancies, and has questions about her due date and reasons for induction today.  She would prefer to go home if no medical need for induction is present and return at a later date for induction or come in actively laboring.     On chart review, pt EDD was set by a very unsure LMP. Pt reports "I didn't know I was pregnant so I had spotting off and on all the time. I just guessed at a date for the last period."  Her 25 week [place] makes her EDD 05/25/21.  EDD was initially set at 05/22/21 by LMP.  Pt presented to prenatal visit on 05/22/21 and had a BP of 125/90 and low baseline on 40 week NST. She was sent to MAU where she had multiple grossly normal BPs, no s/sx of PEC and normal FHR tracing.  She was scheduled for IOL today at [redacted]w[redacted]d.  Consult Dr Debroah Loop to review chart, discuss patient preferences, and options.  Given that patient does not meet criteria for GHTN or PEC, and is technically not postdates, especially if EDD is adjusted to [place] dating, and pt desires discharge from hospital, it is reasonable to offer discharge with close outpatient follow up and reschedule IOL for 41 weeks.     O: BP 121/82 (BP Location: Right Arm)   Pulse 81   Temp (!) 97.4 F (36.3 C) (Oral)   Resp 14   Ht 5\' 5"  (1.651 m)   Wt 103.6 kg   LMP  (Approximate)   BMI 38.02 kg/m  VS reviewed, nursing note reviewed,  Constitutional: well developed, well nourished, no distress HEENT: normocephalic CV: normal rate Pulm/chest wall: normal effort Abdomen: soft Neuro: alert and oriented x 3 Skin: warm, dry Psych: affect normal   Dilation: 1.5 Effacement (%): 50 Cervical Position: Posterior Station: ,  -3 Presentation: Vertex Exam by:: Leftwich-kirby, CNM   Category I FHR tracing, baseline 135 with moderate variability, accels present, no decels Contractions irregular on toco, mild to palpation   A: [email or web address]  at [redacted]w[redacted]d  by 25 week [redacted]w[redacted]d GBS negative Hx PEC in previous pregnancy Isolated elevated BP this pregnancy   P: Membranes swept at pt request, pt cervix 1.5/50/-2 PEC precautions reviewed and pt discharged in stable condition.   Message sent to set up NST/prenatal appt this week, IOL scheduled for midnight 08/31/21.   09/02/21, CNM

## 2021-06-03 ENCOUNTER — Telehealth (HOSPITAL_COMMUNITY): Payer: Self-pay | Admitting: *Deleted

## 2021-06-03 NOTE — Telephone Encounter (Signed)
Preadmission screen  

## 2021-06-07 ENCOUNTER — Other Ambulatory Visit: Payer: Self-pay

## 2021-06-07 ENCOUNTER — Inpatient Hospital Stay (HOSPITAL_COMMUNITY): Payer: Medicaid Other

## 2021-06-07 ENCOUNTER — Inpatient Hospital Stay (HOSPITAL_COMMUNITY)
Admission: AD | Admit: 2021-06-07 | Payer: Medicaid Other | Source: Home / Self Care | Admitting: Obstetrics & Gynecology

## 2021-06-07 ENCOUNTER — Inpatient Hospital Stay (HOSPITAL_COMMUNITY)
Admission: AD | Admit: 2021-06-07 | Discharge: 2021-06-10 | DRG: 806 | Disposition: A | Discharge status: Home / Self Care | Payer: Medicaid Other | Attending: Family Medicine | Admitting: Family Medicine

## 2021-06-07 DIAGNOSIS — Z20822 Contact with and (suspected) exposure to covid-19: Secondary | ICD-10-CM | POA: Diagnosis present

## 2021-06-07 DIAGNOSIS — O134 Gestational [pregnancy-induced] hypertension without significant proteinuria, complicating childbirth: Secondary | ICD-10-CM | POA: Diagnosis present

## 2021-06-07 DIAGNOSIS — O26893 Other specified pregnancy related conditions, third trimester: Secondary | ICD-10-CM | POA: Diagnosis present

## 2021-06-07 DIAGNOSIS — Z3A41 41 weeks gestation of pregnancy: Secondary | ICD-10-CM | POA: Diagnosis not present

## 2021-06-07 DIAGNOSIS — Z3A42 42 weeks gestation of pregnancy: Secondary | ICD-10-CM | POA: Diagnosis not present

## 2021-06-07 DIAGNOSIS — Z6791 Unspecified blood type, Rh negative: Secondary | ICD-10-CM

## 2021-06-07 DIAGNOSIS — O99214 Obesity complicating childbirth: Secondary | ICD-10-CM | POA: Diagnosis present

## 2021-06-07 DIAGNOSIS — O480 Post-term pregnancy: Principal | ICD-10-CM | POA: Diagnosis present

## 2021-06-07 LAB — TYPE AND SCREEN
ABO/RH(D): B NEG
Antibody Screen: NEGATIVE

## 2021-06-07 LAB — COMPREHENSIVE METABOLIC PANEL
ALT: 13 U/L (ref 0–44)
AST: 17 U/L (ref 15–41)
Albumin: 2.7 g/dL — ABNORMAL LOW (ref 3.5–5.0)
Alkaline Phosphatase: 114 U/L (ref 38–126)
Anion gap: 9 (ref 5–15)
BUN: 15 mg/dL (ref 6–20)
CO2: 20 mmol/L — ABNORMAL LOW (ref 22–32)
Calcium: 9.5 mg/dL (ref 8.9–10.3)
Chloride: 108 mmol/L (ref 98–111)
Creatinine, Ser: 0.8 mg/dL (ref 0.44–1.00)
GFR, Estimated: >60 mL/min (ref >60)
Glucose, Bld: 86 mg/dL (ref 70–99)
Potassium: 4.1 mmol/L (ref 3.5–5.1)
Sodium: 137 mmol/L (ref 135–145)
Total Bilirubin: 0.7 mg/dL (ref 0.3–1.2)
Total Protein: 5.8 g/dL — ABNORMAL LOW (ref 6.5–8.1)

## 2021-06-07 LAB — RESP PANEL BY RT-PCR (FLU A&B, COVID) ARPGX2
Influenza A by PCR: NEGATIVE
Influenza B by PCR: NEGATIVE
SARS Coronavirus 2 by RT PCR: NEGATIVE

## 2021-06-07 LAB — CBC
HCT: 36.2 % (ref 36.0–46.0)
Hemoglobin: 11.7 g/dL — ABNORMAL LOW (ref 12.0–15.0)
MCH: 30 pg (ref 26.0–34.0)
MCHC: 32.3 g/dL (ref 30.0–36.0)
MCV: 92.8 fL (ref 80.0–100.0)
Platelets: 202 10*3/uL (ref 150–400)
RBC: 3.9 MIL/uL (ref 3.87–5.11)
RDW: 15.4 % (ref 11.5–15.5)
WBC: 9.4 10*3/uL (ref 4.0–10.5)
nRBC: 0 % (ref 0.0–0.2)

## 2021-06-07 LAB — PROTEIN / CREATININE RATIO, URINE
Creatinine, Urine: 78.48 mg/dL
Total Protein, Urine: <6 mg/dL

## 2021-06-07 MED ORDER — TERBUTALINE SULFATE 1 MG/ML IJ SOLN: 0.2500 mg | Freq: Once | INTRAMUSCULAR | Status: DC | PRN

## 2021-06-07 MED ORDER — MISOPROSTOL 25 MCG QUARTER TABLET: 25.0000 ug | ORAL_TABLET | ORAL | Status: DC | PRN

## 2021-06-07 MED ORDER — OXYTOCIN BOLUS FROM INFUSION
333.0000 mL | Freq: Once | INTRAVENOUS | Status: AC
  Administered 2021-06-08: 333 mL via INTRAVENOUS

## 2021-06-07 MED ORDER — MISOPROSTOL 50MCG HALF TABLET
50.0000 ug | ORAL_TABLET | ORAL | Status: DC | PRN
  Administered 2021-06-07: 50 ug via BUCCAL
  Filled 2021-06-07: qty 1

## 2021-06-07 MED ORDER — LIDOCAINE HCL (PF) 1 % IJ SOLN: 30.0000 mL | INTRAMUSCULAR | Status: DC | PRN

## 2021-06-07 MED ORDER — ONDANSETRON HCL 4 MG/2ML IJ SOLN: 4.0000 mg | Freq: Four times a day (QID) | INTRAMUSCULAR | Status: DC | PRN

## 2021-06-07 MED ORDER — FENTANYL CITRATE (PF) 100 MCG/2ML IJ SOLN
100.0000 ug | INTRAMUSCULAR | Status: DC | PRN
  Administered 2021-06-08: 100 ug via INTRAVENOUS
  Filled 2021-06-07: qty 2

## 2021-06-07 MED ORDER — OXYCODONE-ACETAMINOPHEN 5-325 MG PO TABS: 1.0000 | ORAL_TABLET | ORAL | Status: DC | PRN

## 2021-06-07 MED ORDER — ACETAMINOPHEN 325 MG PO TABS
650.0000 mg | ORAL_TABLET | ORAL | Status: DC | PRN
  Filled 2021-06-07: qty 2

## 2021-06-07 MED ORDER — OXYTOCIN-SODIUM CHLORIDE 30-0.9 UT/500ML-% IV SOLN: 2.5000 [IU]/h | INTRAVENOUS | Status: DC

## 2021-06-07 MED ORDER — SOD CITRATE-CITRIC ACID 500-334 MG/5ML PO SOLN: 30.0000 mL | ORAL | Status: DC | PRN

## 2021-06-07 MED ORDER — LACTATED RINGERS IV SOLN: INTRAVENOUS | Status: DC

## 2021-06-07 MED ORDER — OXYCODONE-ACETAMINOPHEN 5-325 MG PO TABS
2.0000 | ORAL_TABLET | ORAL | Status: DC | PRN
Start: 2021-06-07 — End: 2021-06-08

## 2021-06-07 MED ORDER — LACTATED RINGERS IV SOLN: 500.0000 mL | INTRAVENOUS | Status: DC | PRN

## 2021-06-07 NOTE — Progress Notes (Signed)
Labor Progress Note Jenny [name] is a 35 y.o. [id number] at [redacted]w[redacted]d presented for PDIOL.  S: She says that she is starting to feel pelvic "pressure" but does not believe that she is feeling true contractions at this time. Otherwise feels well and is requesting to walk the halls, use the labor ball to help things progress. Would like to avoid foley balloon if at all possible.    O:  BP 132/84   Pulse 65   Temp 98.3 F (36.8 C) (Oral)   Resp 15   Ht 5\' 5"  (1.651 m)   Wt 104.2 kg   LMP  (Approximate)   BMI 38.22 kg/m  EFM: 140/moderate/accelerations present  CVE: Dilation: 3 Effacement (%): 50 Cervical Position: Anterior Station: -1 Presentation: Vertex Exam by:: Dr. [email or web address]   A&P: 35 y.o. 31 [redacted]w[redacted]d  #Labor: Progressing well. Patient declines foley balloon at this time. Hard to trace contractions on monitor but patient reports irregular, mild contractions. Will re-assess in four hours and consider starting pitocin at that time #Pain: Does not desire epidural #FWB: Cat I  #GBS negative #Hx of PPH- Consider TXA at delivery #Hx of PEC in prior pregnancy- Isolated high pressure (142/82) at 1835, monitor closely  [redacted]w[redacted]d, MD 9:29 PM

## 2021-06-07 NOTE — H&P (Signed)
Jenny [name] is a 35 y.o. female presenting for PDIOL at [redacted]w[redacted]d. On arrival to L&D she denies contractions, vaginal bleeding, leaking of fluid, decreased fetal movement, fever, falls, or recent illness. Patient received prenatal care with [facility].  OB History     Gravida  5   Para  3   Term  3   Preterm  0   AB  1   Living  4      SAB  1   IAB  0   Ectopic  0   Multiple  1   Live Births  4          Nursing Staff Provider  Office Location  [facility] Dating  LMP/25 wk [place]  Language  English Anatomy US  WNL  Flu Vaccine  declined Genetic/Carrier Screen  NIPS:   Low risk AFP: too late to care   Horizon:  TDaP Vaccine  03/05/21  Hgb A1C or  GTT Early  Third trimester - WNL-- trend to insulin resistance with 2hr at 160. Recommend lower carb diet  COVID Vaccine no   LAB RESULTS   Rhogam  03/05/21 Blood Type B/Negative/-- (05/26 0907)   Baby Feeding Plan Breast Antibody Never drawn- received rhogam 5/26  Contraception Unsure  Rubella 1.76 (05/05 1010)  Circumcision Yes RPR Non Reactive (05/26 0907)   Pediatrician  [facility]  HBsAg Negative (05/05 1010)   Support Person Nicholas FOB HCVAb NEG  Prenatal Classes  HIV Non Reactive (05/26 0907)     BTL Consent  GBS    Neg   VBAC Consent  Pap      GC/CT neg 05/15/21  BP Cuff Give Rx to [employer]  [ ]  Class [ ]  Consent [ ]  CNM visit  PHQ9 & GAD7 [  ] new OB [x]  28 weeks  [  ] 36 weeks Induction  [ ]  Orders Entered [ ] Foley Y/N   Past Medical History:  Diagnosis Date   Anemia    Dyspnea    Eczema    Mild preeclampsia, third trimester 08/01/2018   Postpartum hemorrhage - 3400 cc 08/06/2018   Pregnancy induced hypertension    Urinary tract infection    Vaginal Pap smear, abnormal    Past Surgical History:  Procedure Laterality Date   LIP REPAIR     Family History: family history includes Diabetes in her paternal grandfather and paternal grandmother; Healthy in her father; Heart disease in her mother;  Hypertension in her mother; Stroke in her maternal grandfather, maternal grandmother, and paternal grandmother; Thyroid disease in her mother. Social History:  reports that she has never smoked. She has never used smokeless tobacco. She reports that she does not drink alcohol and does not use drugs.     Maternal Diabetes: No Genetic Screening: Normal Maternal Ultrasounds/Referrals: Normal Fetal Ultrasounds or other Referrals:  Fetal echo for family history of Congenital Heart Disease Maternal Substance Abuse:  No Significant Maternal Medications:  None Significant Maternal Lab Results:  Group B Strep negative and Rh negative Other Comments:   Late prenatal care, interrupted prenatal care  Review of Systems  Gastrointestinal:  Negative for abdominal pain.  Genitourinary:  Negative for vaginal bleeding.  All other systems reviewed and are negative.    unknown if currently breastfeeding. Exam Physical Exam Vitals and nursing note reviewed. Exam conducted with a chaperone present.  Constitutional:      General: She is not in acute distress.    Appearance: Normal appearance. She is  obese. She is not ill-appearing.  Cardiovascular:     Rate and Rhythm: Normal rate and regular rhythm.     Pulses: Normal pulses.     Heart sounds: Normal heart sounds.  Pulmonary:     Effort: Pulmonary effort is normal.     Breath sounds: Normal breath sounds.  Abdominal:     Comments: Gravid  Skin:    Capillary Refill: Capillary refill takes less than 2 seconds.  Neurological:     Mental Status: She is alert and oriented to person, place, and time.  Psychiatric:        Mood and Affect: Mood normal.        Behavior: Behavior normal.        Thought Content: Thought content normal.        Judgment: Judgment normal.    Prenatal labs: ABO, Rh: --/--/B NEG (08/16 1318) Antibody: NEG (08/16 1318) Rubella: 1.76 (05/05 1010) RPR: NON REACTIVE (08/16 1318)  HBsAg: Negative (05/05 1010)  HIV: Non  Reactive (05/26 0907)  GBS: Negative/-- (08/05 1048)   Assessment/Plan: --35 y.o. [id number] at [redacted]w[redacted]d  --Cat I tracing --EFW 38% at 29w 5d --GBS Neg --Proven pelvis 4280g  Labor --Patient with significant concerns regarding Pitocin and foley balloon placment --Agreeable to membrane sweep and buccal Cytotec now --Will assess for AROM with future exam  Postpartum Planning --Inpatient SW consult for late and interrupted prenatal care  Boy (yes)/breast/condoms + partner vasectomy  Calvert Cantor, CNM 06/07/2021, 4:58 PM

## 2021-06-08 ENCOUNTER — Encounter (HOSPITAL_COMMUNITY): Payer: Self-pay | Admitting: Obstetrics and Gynecology

## 2021-06-08 ENCOUNTER — Inpatient Hospital Stay (HOSPITAL_COMMUNITY): Payer: Medicaid Other | Admitting: Anesthesiology

## 2021-06-08 DIAGNOSIS — O480 Post-term pregnancy: Secondary | ICD-10-CM

## 2021-06-08 DIAGNOSIS — Z3A42 42 weeks gestation of pregnancy: Secondary | ICD-10-CM

## 2021-06-08 LAB — RPR: RPR Ser Ql: NONREACTIVE

## 2021-06-08 MED ORDER — TRANEXAMIC ACID-NACL 1000-0.7 MG/100ML-% IV SOLN
INTRAVENOUS | Status: AC
  Administered 2021-06-08: 1000 mg
  Filled 2021-06-08: qty 100

## 2021-06-08 MED ORDER — EPHEDRINE 5 MG/ML INJ: 10.0000 mg | INTRAVENOUS | Status: DC | PRN

## 2021-06-08 MED ORDER — BENZOCAINE-MENTHOL 20-0.5 % EX AERO: 1.0000 "application " | INHALATION_SPRAY | CUTANEOUS | Status: DC | PRN

## 2021-06-08 MED ORDER — ONDANSETRON HCL 4 MG/2ML IJ SOLN: 4.0000 mg | INTRAMUSCULAR | Status: DC | PRN

## 2021-06-08 MED ORDER — TRANEXAMIC ACID-NACL 1000-0.7 MG/100ML-% IV SOLN
1000.0000 mg | Freq: Once | INTRAVENOUS | Status: AC
  Administered 2021-06-08: 1000 mg via INTRAVENOUS
  Filled 2021-06-08: qty 100

## 2021-06-08 MED ORDER — LACTATED RINGERS IV SOLN: 500.0000 mL | Freq: Once | INTRAVENOUS | Status: DC

## 2021-06-08 MED ORDER — CEFAZOLIN SODIUM-DEXTROSE 2-4 GM/100ML-% IV SOLN: 2.0000 g | Freq: Three times a day (TID) | INTRAVENOUS | Status: DC

## 2021-06-08 MED ORDER — PHENYLEPHRINE 40 MCG/ML (10ML) SYRINGE FOR IV PUSH (FOR BLOOD PRESSURE SUPPORT): 80.0000 ug | PREFILLED_SYRINGE | INTRAVENOUS | Status: DC | PRN

## 2021-06-08 MED ORDER — MISOPROSTOL 200 MCG PO TABS
ORAL_TABLET | ORAL | Status: AC
  Administered 2021-06-08: 1000 ug via BUCCAL
  Filled 2021-06-08: qty 5

## 2021-06-08 MED ORDER — ACETAMINOPHEN 325 MG PO TABS: 650.0000 mg | ORAL_TABLET | ORAL | Status: DC | PRN

## 2021-06-08 MED ORDER — DIBUCAINE (PERIANAL) 1 % EX OINT: 1.0000 "application " | TOPICAL_OINTMENT | CUTANEOUS | Status: DC | PRN

## 2021-06-08 MED ORDER — LIDOCAINE HCL (PF) 1 % IJ SOLN
INTRAMUSCULAR | Status: DC | PRN
  Administered 2021-06-08: 8 mL via EPIDURAL

## 2021-06-08 MED ORDER — SIMETHICONE 80 MG PO CHEW
80.0000 mg | CHEWABLE_TABLET | ORAL | Status: DC | PRN
  Filled 2021-06-08: qty 1

## 2021-06-08 MED ORDER — DIPHENHYDRAMINE HCL 50 MG/ML IJ SOLN
12.5000 mg | INTRAMUSCULAR | Status: DC | PRN
Start: 2021-06-08 — End: 2021-06-08

## 2021-06-08 MED ORDER — TRANEXAMIC ACID-NACL 1000-0.7 MG/100ML-% IV SOLN: 1000.0000 mg | INTRAVENOUS | Status: AC

## 2021-06-08 MED ORDER — OXYTOCIN-SODIUM CHLORIDE 30-0.9 UT/500ML-% IV SOLN
1.0000 m[IU]/min | INTRAVENOUS | Status: DC
  Administered 2021-06-08: 2 m[IU]/min via INTRAVENOUS

## 2021-06-08 MED ORDER — ACETAMINOPHEN 500 MG PO TABS
1000.0000 mg | ORAL_TABLET | Freq: Four times a day (QID) | ORAL | Status: DC | PRN
  Administered 2021-06-08: 1000 mg via ORAL
  Filled 2021-06-08: qty 2

## 2021-06-08 MED ORDER — WITCH HAZEL-GLYCERIN EX PADS: 1.0000 "application " | MEDICATED_PAD | CUTANEOUS | Status: DC | PRN

## 2021-06-08 MED ORDER — SENNOSIDES-DOCUSATE SODIUM 8.6-50 MG PO TABS
2.0000 | ORAL_TABLET | Freq: Every day | ORAL | Status: DC
  Administered 2021-06-10: 2 via ORAL
  Filled 2021-06-08 (×3): qty 2

## 2021-06-08 MED ORDER — TETANUS-DIPHTH-ACELL PERTUSSIS 5-2.5-18.5 LF-MCG/0.5 IM SUSY: 0.5000 mL | PREFILLED_SYRINGE | Freq: Once | INTRAMUSCULAR | Status: DC

## 2021-06-08 MED ORDER — FENTANYL-BUPIVACAINE-NACL 0.5-0.125-0.9 MG/250ML-% EP SOLN
12.0000 mL/h | EPIDURAL | Status: DC | PRN
  Administered 2021-06-08: 12 mL/h via EPIDURAL
  Filled 2021-06-08: qty 250

## 2021-06-08 MED ORDER — PRENATAL MULTIVITAMIN CH
1.0000 | ORAL_TABLET | Freq: Every day | ORAL | Status: DC
  Administered 2021-06-09 – 2021-06-10 (×2): 1 via ORAL
  Filled 2021-06-08 (×2): qty 1

## 2021-06-08 MED ORDER — ZOLPIDEM TARTRATE 5 MG PO TABS: 5.0000 mg | ORAL_TABLET | Freq: Every evening | ORAL | Status: DC | PRN

## 2021-06-08 MED ORDER — CEFAZOLIN SODIUM-DEXTROSE 2-4 GM/100ML-% IV SOLN
2.0000 g | Freq: Once | INTRAVENOUS | Status: AC
  Administered 2021-06-08: 2 g via INTRAVENOUS
  Filled 2021-06-08: qty 100

## 2021-06-08 MED ORDER — ONDANSETRON HCL 4 MG PO TABS: 4.0000 mg | ORAL_TABLET | ORAL | Status: DC | PRN

## 2021-06-08 MED ORDER — OXYTOCIN-SODIUM CHLORIDE 30-0.9 UT/500ML-% IV SOLN
1.0000 m[IU]/min | INTRAVENOUS | Status: DC
  Administered 2021-06-08: 2 m[IU]/min via INTRAVENOUS
  Filled 2021-06-08: qty 500

## 2021-06-08 MED ORDER — COCONUT OIL OIL: 1.0000 "application " | TOPICAL_OIL | Status: DC | PRN

## 2021-06-08 MED ORDER — IBUPROFEN 600 MG PO TABS
600.0000 mg | ORAL_TABLET | Freq: Four times a day (QID) | ORAL | Status: DC
  Administered 2021-06-09 – 2021-06-10 (×7): 600 mg via ORAL
  Filled 2021-06-08 (×7): qty 1

## 2021-06-08 MED ORDER — MISOPROSTOL 200 MCG PO TABS: 1000.0000 ug | ORAL_TABLET | Freq: Once | ORAL | Status: AC

## 2021-06-08 MED ORDER — TERBUTALINE SULFATE 1 MG/ML IJ SOLN: 0.2500 mg | Freq: Once | INTRAMUSCULAR | Status: DC | PRN

## 2021-06-08 MED ORDER — DIPHENHYDRAMINE HCL 25 MG PO CAPS: 25.0000 mg | ORAL_CAPSULE | Freq: Four times a day (QID) | ORAL | Status: DC | PRN

## 2021-06-08 MED ORDER — METHYLERGONOVINE MALEATE 0.2 MG/ML IJ SOLN
INTRAMUSCULAR | Status: AC
  Administered 2021-06-08: 0.2 mg via INTRAMUSCULAR
  Filled 2021-06-08: qty 1

## 2021-06-08 NOTE — Progress Notes (Signed)
Labor Progress Note Jenny [name] is a 35 y.o. R4E3154 at [redacted]w[redacted]d presented for IOL PD.   S: Still feeling contractions, does feel like they have spaced alittle bit.   O:  BP (!) 134/94 (BP Location: Left Arm)   Pulse 74   Temp 97.8 F (36.6 C) (Oral)   Resp 15   Ht 5\' 5"  (1.651 m)   Wt 104.2 kg   LMP  (Approximate)   BMI 38.22 kg/m  EFM: 125/mod/none/none  CVE: Dilation: 4 Effacement (%): 70 Cervical Position: Anterior Station: -2 Presentation: Vertex Exam by:: Marlyn Rabine, DO   A&P: 35 y.o. 31 [redacted]w[redacted]d  #Labor: Minimal progression since last check. She is amenable to trying low dose pit for augmentation of her early labor at this point, but would like to use the least amount if possible. Will increase slowly (by 1 rather than 2x2) due to patient hesitancy.   #Pain: PRN  #FWB: Cat 1 #GBS negative   [redacted]w[redacted]d, DO 6:23 AM

## 2021-06-08 NOTE — Progress Notes (Signed)
Pt requests that Pitocin be turned off. Pt requesting a nap. Cleone Slim CNM updated. Pitocin discontinued.

## 2021-06-08 NOTE — Progress Notes (Signed)
CNM notified of increase in bleeding with fundal rub. Several large clots removed with fundal rub. Methergine IM series ordered. Clots weighed an found to be . EBL total . Fundus now firm and no more bleeding. Will continue to observe.   Rolm Bookbinder, CNM 06/08/21 6:50 PM

## 2021-06-08 NOTE — Progress Notes (Signed)
Labor Progress Note Jenny [name] is a 35 y.o. [id number] at [redacted]w[redacted]d presented for IOL for postdates  S:  Patient resting comfortably with epidural  O:  BP 121/68   Pulse 62   Temp 98.8 F (37.1 C) (Oral)   Resp 16   Ht 5\' 5"  (1.651 m)   Wt 104.2 kg   LMP  (Approximate)   SpO2 100%   BMI 38.22 kg/m   Fetal Tracing:  Baseline: 125 Variability: moderate Accels: 15x15 Decels: none  Toco: 1-4   CVE: Dilation: 8 Effacement (%): 90 Cervical Position: Anterior Station: 0 Presentation: Vertex Exam by:: [email or web address], CNM   A&P: 35 y.o. 31 [redacted]w[redacted]d IOL postdates #Labor: Progressing well. Will continue expectant management for now #Pain: epidural #FWB: Cat 1 #GBS negative   [redacted]w[redacted]d, CNM 10:28 AM

## 2021-06-08 NOTE — Progress Notes (Signed)
Labor Progress Note Emaya Preston is a 35 y.o. [id number] at [redacted]w[redacted]d presented for IOL PD.   S: Has been walking the halls frequently and bouncing on the exercise ball. Feeling the contractions more intense since recent cervical exam. Feeling them about every 3 minutes. Still feeling very hesitant about starting pit, hopeful her body has kicked into action. She is not interested in AROM.   O:  BP (!) 143/88   Pulse 68   Temp (!) 97.4 F (36.3 C) (Oral)   Resp 15   Ht 5\' 5"  (1.651 m)   Wt 104.2 kg   LMP  (Approximate)   BMI 38.22 kg/m  EFM: 150/mod/15x15/none   CVE: Dilation: 4 Effacement (%): 60 Cervical Position: Anterior Station: -2 Presentation: Vertex Exam by:: [email or web address], RN/E. Chipps, RN   A&P: 35 y.o. 31 [redacted]w[redacted]d  #Labor: has made some cervical change with expectant management and contracting every few minutes, recently feeling stronger. Discussed options with patient, including starting pit now vs allowing how her contractions progress over the next 30 minutes. She opted to monitor, if spacing will start pit.  #Pain: PRN if desires, does not want an epidural  #FWB: Cat 1  #GBS negative    [redacted]w[redacted]d, DO 3:14 AM

## 2021-06-08 NOTE — Progress Notes (Signed)
Labor Progress Note Naeema Patlan is a 35 y.o. [id number] at [redacted]w[redacted]d presented for IOL for postdates  S:  Patient sleeping soundly  O:  BP 131/73   Pulse 68   Temp 98.9 F (37.2 C) (Oral)   Resp 18   Ht 5\' 5"  (1.651 m)   Wt 104.2 kg   LMP  (Approximate)   SpO2 100%   BMI 38.22 kg/m   Fetal Tracing:  Baseline: 125 Variability: moderate Accels: 15x15 Decels: early  Toco: 2-4   CVE: Dilation: Lip/rim Effacement (%): 100 Cervical Position: Anterior Station: Plus 1 Presentation: Vertex Exam by:: [email or web address], CNM   A&P: 35 y.o. 31 [redacted]w[redacted]d IOL posdates #Labor: Progressing well. Will recheck in 1 hour and start pushing if complete #Pain: epidural #FWB: Cat 1 #GBS negative   [redacted]w[redacted]d, CNM 1:57 PM

## 2021-06-08 NOTE — Anesthesia Preprocedure Evaluation (Signed)
Anesthesia Evaluation  Patient identified by MRN, date of birth, ID band Patient awake    Reviewed: Allergy & Precautions, NPO status , Patient's Chart, lab work & pertinent test results  Airway Mallampati: II  TM Distance: >3 FB Neck ROM: Full    Dental no notable dental hx.    Pulmonary neg pulmonary ROS,    Pulmonary exam normal breath sounds clear to auscultation       Cardiovascular hypertension, negative cardio ROS Normal cardiovascular exam Rhythm:Regular Rate:Normal     Neuro/Psych negative neurological ROS  negative psych ROS   GI/Hepatic negative GI ROS, Neg liver ROS,   Endo/Other  Morbid obesity  Renal/GU negative Renal ROS  negative genitourinary   Musculoskeletal negative musculoskeletal ROS (+)   Abdominal   Peds negative pediatric ROS (+)  Hematology  (+) anemia ,   Anesthesia Other Findings   Reproductive/Obstetrics (+) Pregnancy                             Anesthesia Physical Anesthesia Plan  ASA: 2  Anesthesia Plan: Epidural   Post-op Pain Management:    Induction:   PONV Risk Score and Plan: 2 and Treatment may vary due to age or medical condition  Airway Management Planned: Natural Airway  Additional Equipment:   Intra-op Plan:   Post-operative Plan:   Informed Consent: I have reviewed the patients History and Physical, chart, labs and discussed the procedure including the risks, benefits and alternatives for the proposed anesthesia with the patient or authorized representative who has indicated his/her understanding and acceptance.       Plan Discussed with: Anesthesiologist  Anesthesia Plan Comments:         Anesthesia Quick Evaluation

## 2021-06-08 NOTE — Progress Notes (Signed)
CNM called back to bedside for bleeding. Another expressed with fundal massage. EBL now at . Dr. Vergie Living notified and instructed to place Jada. Second TXA given, cytotec buccally given. IV fentanyl given and manual sweep expressed several large clots. Jada placed without difficulty and inflated with in sterile water in balloon.   Will monitor with Jada in place for 90 minutes and reassess bleeding at that time. Dr. Vergie Living updated on patient status.  Rolm Bookbinder, CNM 06/08/21 7:31 PM

## 2021-06-08 NOTE — Progress Notes (Signed)
Cervix unchanged. Will restart pitocin and recheck in 1 hour.   Rolm Bookbinder, CNM 06/08/21 3:09 PM

## 2021-06-08 NOTE — Anesthesia Procedure Notes (Signed)
Epidural Patient location during procedure: OB Start time: 06/08/2021 9:05 AM End time: 06/08/2021 9:16 AM  Staffing Anesthesiologist: Mellody Dance, MD Performed: anesthesiologist   Preanesthetic Checklist Completed: patient identified, IV checked, site marked, risks and benefits discussed, monitors and equipment checked, pre-op evaluation and timeout performed  Epidural Patient position: sitting Prep: DuraPrep Patient monitoring: heart rate, cardiac monitor, continuous pulse ox and blood pressure Approach: midline Location: L2-L3 Injection technique: LOR saline  Needle:  Needle type: Tuohy  Needle gauge: 17 G Needle length: 9 cm Needle insertion depth: 4 cm Catheter type: closed end flexible Catheter size: 20 Guage Catheter at skin depth: 9 cm Test dose: negative and Other  Assessment Events: blood not aspirated, injection not painful, no injection resistance and negative IV test  Additional Notes Informed consent obtained prior to proceeding including risk of failure, 1% risk of PDPH, risk of minor discomfort and bruising.  Discussed rare but serious complications including epidural abscess, permanent nerve injury, epidural hematoma.  Discussed alternatives to epidural analgesia and patient desires to proceed.  Timeout performed pre-procedure verifying patient name, procedure, and platelet count.  Patient tolerated procedure well.

## 2021-06-08 NOTE — Progress Notes (Signed)
Jada suction was turned off and cervical balloon was deflated around 2230. Minimal to no bleeding for approximately 20 minutes afterwards. Jada then removed with ease. Normal lochia with no significant bleeding. Will leave foley in for 12 hours and transfer to mother baby unit. Check CBC in am. Monitor any further bleeding closely.    Allayne Stack, DO

## 2021-06-08 NOTE — Discharge Summary (Signed)
Postpartum Discharge Summary       Patient Name: Jenny [name] DOB: 12-13-1985 MRN: [id number]  Date of admission: 06/07/2021 Delivery date: 06/08/2021  Delivering provider:  Wende Mott  Date of discharge: 06/10/2021  Admitting diagnosis: Post term pregnancy over 40 weeks [O48.0] Intrauterine pregnancy: [redacted]w[redacted]d    Secondary diagnosis:  Active Problems:   Post term pregnancy over 40 weeks  Additional problems: PHickman   Discharge diagnosis: Term Pregnancy Delivered and Gestational Hypertension                                              Post partum procedures: s/p Jada placement for PPH Augmentation: Pitocin and Cytotec Complications: None  Hospital course: Induction of Labor With Vaginal Delivery   35y.o. yo [id number] 45w0das admitted to the hospital 06/07/2021 for induction of labor.  Indication for induction: Postdates.  Patient had an uncomplicated labor course as follows:  She was admitted and received one dose of cytotec and a membrane sweep. She was then started on pitocin and requested it be stopped because of pain. She then made progress to 9cm on her own where pitocin was started again after a stall in labor. She pushed quickly to deliver uneventfully. TXA was given prophylactically given her history of PPH x2. Jada placed.  Membrane Rupture Time/Date:  9:30 AM , 06/08/2021   Delivery Method: Vaginal, Spontaneous  Episiotomy:  None  Lacerations:   None  Details of delivery can be found in separate delivery note.  Patient had a routine postpartum course. Patient is discharged home 06/10/21.  Newborn Data: Birth date: 06/08/2021  Birth time: 5:18 PM  Gender: Female  Living status: Living  Apgars: 8 , 9  Weight: 3960 g   Magnesium Sulfate received: No BMZ received: No Rhophylac:No - baby RH Neg also MMR:No T-DaP:Given prenatally Flu: No Transfusion:No  Physical exam  Vitals:   06/09/21 0850 06/09/21 1154 06/09/21 2125 06/10/21 0530  BP:  113/64 113/60 112/74 117/68  Pulse: 62 66 68 72  Resp: 17 16 18 17   Temp: 98.2 F (36.8 C) 97.6 F (36.4 C) 98 F (36.7 C) 98.4 F (36.9 C)  TempSrc: Oral Oral Oral Oral  SpO2: 99%  97% 99%  Weight:      Height:       General: alert, cooperative, and no distress Lochia: appropriate Uterine Fundus: firm Incision: N/A DVT Evaluation: No evidence of DVT seen on physical exam. Negative Homan's sign. No cords or calf tenderness. No significant calf/ankle edema. Labs: Lab Results  Component Value Date   WBC 11.3 (H) 06/09/2021   HGB 9.8 (L) 06/09/2021   HCT 29.9 (L) 06/09/2021   MCV 92.3 06/09/2021   PLT 161 06/09/2021   CMP Latest Ref Rng & Units 06/07/2021  Glucose 70 - 99 mg/dL 86  BUN 6 - 20 mg/dL 15  Creatinine 0.44 - 1.00 mg/dL 0.80  Sodium 135 - 145 mmol/L 137  Potassium 3.5 - 5.1 mmol/L 4.1  Chloride 98 - 111 mmol/L 108  CO2 22 - 32 mmol/L 20(L)  Calcium 8.9 - 10.3 mg/dL 9.5  Total Protein 6.5 - 8.1 g/dL 5.8(L)  Total Bilirubin 0.3 - 1.2 mg/dL 0.7  Alkaline Phos 38 - 126 U/L 114  AST 15 - 41 U/L 17  ALT 0 - 44 U/L 13   Edinburgh Score: Edinburgh Postnatal Depression  Scale Screening Tool 06/09/2021  I have been able to laugh and see the funny side of things. 0  I have looked forward with enjoyment to things. 0  I have blamed myself unnecessarily when things went wrong. 1  I have been anxious or worried for no good reason. 0  I have felt scared or panicky for no good reason. 0  Things have been getting on top of me. 0  I have been so unhappy that I have had difficulty sleeping. 0  I have felt sad or miserable. 0  I have been so unhappy that I have been crying. 0  The thought of harming myself has occurred to me. 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   Blood Pressure Kit Devi 1 Device by Does not apply route as needed.   ibuprofen 600 MG tablet Commonly known as: ADVIL Take 1 tablet (600 mg total) by mouth every 6 (six) hours.   prenatal multivitamin Tabs tablet Take 1 tablet by mouth daily at 12 noon.   PROBIOTIC PO Take 1 capsule by mouth daily.         Discharge home in stable condition Infant Feeding: Breast Infant Disposition:home with mother Discharge instruction: per After Visit Summary and Postpartum booklet. Activity: Advance as tolerated. Pelvic rest for 6 weeks.  Diet: routine diet Future Appointments: Future Appointments  Date Time Provider [place]  07/06/2021  2:35 PM Griffin Basil, MD [facility]   Follow up Visit:  [place] for [place] at [facility]. Schedule an appointment as soon as possible for a visit in 4 week(s).   Specialty: Obstetrics and Gynecology Why: postpartum visit Contact information: [place] [phone]                 Please schedule this patient for a In person postpartum visit in 4 weeks with the following provider: Any provider. Additional Postpartum F/U:BP check 1 week  High risk pregnancy complicated by: HTN Delivery mode:   Vaginal, Spontaneous  Anticipated Birth Control:  Condoms - spouse planning vasectomy   06/10/2021 Laury Deep, CNM

## 2021-06-09 LAB — CBC
HCT: 29.9 % — ABNORMAL LOW (ref 36.0–46.0)
Hemoglobin: 9.8 g/dL — ABNORMAL LOW (ref 12.0–15.0)
MCH: 30.2 pg (ref 26.0–34.0)
MCHC: 32.8 g/dL (ref 30.0–36.0)
MCV: 92.3 fL (ref 80.0–100.0)
Platelets: 161 10*3/uL (ref 150–400)
RBC: 3.24 MIL/uL — ABNORMAL LOW (ref 3.87–5.11)
RDW: 15.8 % — ABNORMAL HIGH (ref 11.5–15.5)
WBC: 11.3 10*3/uL — ABNORMAL HIGH (ref 4.0–10.5)
nRBC: 0 % (ref 0.0–0.2)

## 2021-06-09 NOTE — Progress Notes (Signed)
Post Partum Day #1 Subjective: no complaints, up ad lib, voiding, tolerating PO, and + flatus. Denies dizziness, able to walk easily. No clots or significant VB.  Objective: Blood pressure 113/60, pulse 66, temperature 97.6 F (36.4 C), temperature source Oral, resp. rate 16, height 5\' 5"  (1.651 m), weight 104.2 kg, SpO2 99 %, unknown if currently breastfeeding.  Physical Exam:  General: alert, cooperative, appears stated age, and no distress Lochia: appropriate Uterine Fundus: firm Incision: N/A DVT Evaluation: No evidence of DVT seen on physical exam. Negative Homan's sign.  Recent Labs    06/07/21 1632 06/09/21 0529  HGB 11.7* 9.8*  HCT 36.2 29.9*    Assessment/Plan: Plan for discharge tomorrow, Breastfeeding, Lactation consult, Circumcision prior to discharge, and Contraception is planned vasectomy and condoms until then.   LOS: 2 days   06/11/21, DO 06/09/2021, 5:31 PM

## 2021-06-09 NOTE — Anesthesia Postprocedure Evaluation (Signed)
Anesthesia Post Note  Patient: Jenny [name]  Procedure(s) Performed: AN AD HOC LABOR EPIDURAL     Patient location during evaluation: Mother Baby Anesthesia Type: Epidural Level of consciousness: awake and alert and oriented Pain management: satisfactory to patient Vital Signs Assessment: post-procedure vital signs reviewed and stable Respiratory status: respiratory function stable Cardiovascular status: stable Postop Assessment: no headache, no backache, epidural receding, patient able to bend at knees, no signs of nausea or vomiting, adequate PO intake and able to ambulate Anesthetic complications: no   No notable events documented.  Last Vitals:  Vitals:   06/09/21 0415 06/09/21 0850  BP: 123/71 113/64  Pulse: 67 62  Resp: 18 17  Temp: 36.8 C 36.8 C  SpO2: 100% 99%    Last Pain:  Vitals:   06/09/21 0850  TempSrc: Oral  PainSc:    Pain Goal:                   Virgene Tirone

## 2021-06-10 MED ORDER — IBUPROFEN 600 MG PO TABS: 600.0000 mg | ORAL_TABLET | Freq: Four times a day (QID) | ORAL | 0 refills | Status: DC

## 2021-06-10 MED ORDER — BENZOCAINE-MENTHOL 20-0.5 % EX AERO: 1.0000 "application " | INHALATION_SPRAY | CUTANEOUS | 0 refills | Status: DC | PRN

## 2021-06-20 ENCOUNTER — Telehealth (HOSPITAL_COMMUNITY): Payer: Self-pay

## 2021-06-20 NOTE — Telephone Encounter (Signed)
"  I have been having some constipation. It's better now. I've been having a foul vaginal odor. I was wondering if I should get that checked out?" RN asked patient is she has any fever, vaginal drainage, or increased pain? "No I just noticed this foul odor. My bleeding is pretty much gone. I had an infection in my uterus before with my previous child." RN told patient to call her OB and speak with them about the foul vaginal odor. RN also told patient that she can always come to [facility] MAU and get to have an exam done if she is concerned and would like to be seen right away.  "The baby is doing pretty good. We had a pediatrician appointment they said everything looks pretty good. When does his cord fall off? I can't remember from my other kids. It fell off already but it seemed really soon." RN told patient that the cord normally falls off 1-3 weeks after delivery. "Should I keep putting vaseline on his penis? He was circumcised in the hospital." RN told patient to continue vaseline until circumsion is healing and when pediatrician says its ok to stop. "He is doing good, he is healthy." He sleeps in a bassinet and has a crib."RN reviewed ABC's of safe sleep with patient. Patient declines any questions or concerns about baby.  EPDS score is 0.   Marcelino Duster [facility] 06/20/2021,1427

## 2021-06-22 ENCOUNTER — Other Ambulatory Visit (HOSPITAL_COMMUNITY)
Admission: RE | Admit: 2021-06-22 | Discharge: 2021-06-22 | Disposition: A | Discharge status: Home / Self Care | Payer: Medicaid Other | Source: Ambulatory Visit | Attending: Family Medicine | Admitting: Family Medicine

## 2021-06-22 ENCOUNTER — Ambulatory Visit (INDEPENDENT_AMBULATORY_CARE_PROVIDER_SITE_OTHER): Payer: Medicaid Other

## 2021-06-22 ENCOUNTER — Other Ambulatory Visit: Payer: Self-pay

## 2021-06-22 VITALS — BP 111/87 | HR 99

## 2021-06-22 DIAGNOSIS — N898 Other specified noninflammatory disorders of vagina: Secondary | ICD-10-CM | POA: Diagnosis not present

## 2021-06-22 NOTE — Progress Notes (Signed)
Pt c/o mucosy discharge that is pink colored and has an odor.  Pt states that she was told after her last delivery she had an infection after she hemorraged and she just wants to make sure that she does have an infection.  Pt explained how to obtain self swab and that we will call with abnormal results.  Pt verbalized understanding.   Leonette Nutting  06/22/21

## 2021-06-23 LAB — CERVICOVAGINAL ANCILLARY ONLY
Bacterial Vaginitis (gardnerella): NEGATIVE
Candida Glabrata: NEGATIVE
Candida Vaginitis: NEGATIVE
Chlamydia: NEGATIVE
Comment: NEGATIVE
Comment: NEGATIVE
Comment: NEGATIVE
Comment: NEGATIVE
Comment: NEGATIVE
Comment: NORMAL
Neisseria Gonorrhea: NEGATIVE
Trichomonas: NEGATIVE

## 2021-06-24 ENCOUNTER — Other Ambulatory Visit: Payer: Self-pay

## 2021-06-24 ENCOUNTER — Telehealth: Payer: Self-pay

## 2021-06-24 ENCOUNTER — Inpatient Hospital Stay (HOSPITAL_COMMUNITY)
Admission: AD | Admit: 2021-06-24 | Discharge: 2021-06-24 | Disposition: A | Discharge status: Home / Self Care | Payer: Medicaid Other | Attending: Obstetrics & Gynecology | Admitting: Obstetrics & Gynecology

## 2021-06-24 DIAGNOSIS — O8622 Infection of bladder following delivery: Secondary | ICD-10-CM | POA: Diagnosis not present

## 2021-06-24 DIAGNOSIS — R300 Dysuria: Secondary | ICD-10-CM | POA: Diagnosis present

## 2021-06-24 DIAGNOSIS — Z87440 Personal history of urinary (tract) infections: Secondary | ICD-10-CM | POA: Insufficient documentation

## 2021-06-24 DIAGNOSIS — O8629 Other urinary tract infection following delivery: Secondary | ICD-10-CM

## 2021-06-24 LAB — URINALYSIS, ROUTINE W REFLEX MICROSCOPIC
Bilirubin Urine: NEGATIVE
Glucose, UA: NEGATIVE mg/dL
Ketones, ur: NEGATIVE mg/dL
Nitrite: POSITIVE — AB
Protein, ur: 100 mg/dL — AB
RBC / HPF: >50 RBC/hpf — ABNORMAL HIGH (ref 0–5)
Specific Gravity, Urine: 1.016 (ref 1.005–1.030)
WBC, UA: >50 WBC/hpf — ABNORMAL HIGH (ref 0–5)
pH: 6 (ref 5.0–8.0)

## 2021-06-24 MED ORDER — SULFAMETHOXAZOLE-TRIMETHOPRIM 800-160 MG PO TABS: 1.0000 | ORAL_TABLET | Freq: Two times a day (BID) | ORAL | 0 refills | Status: DC

## 2021-06-24 NOTE — MAU Provider Note (Signed)
History     CSN: [id number]  Arrival date and time: 06/24/21 1755   Event Date/Time   First Provider Initiated Contact with Patient 06/24/21 1943      Chief Complaint  Patient presents with   Dysuria   Urinary Frequency   HPI  Ms.Jenny [name] is a 35 y.o. female status post vaginal delivery on 06/08/21. She presents with new onset dysuria, urgency, and frequency. Symptoms started yesterday.  She has occasional lower abdominal cramping, none now. No fever.   OB History     Gravida  5   Para  4   Term  4   Preterm  0   AB  1   Living  5      SAB  1   IAB  0   Ectopic  0   Multiple  1   Live Births  5           Past Medical History:  Diagnosis Date   Anemia    Dyspnea    Eczema    Mild preeclampsia, third trimester 08/01/2018   Postpartum hemorrhage - 3400 cc 08/06/2018   Pregnancy induced hypertension    Urinary tract infection    Vaginal Pap smear, abnormal     Past Surgical History:  Procedure Laterality Date   LIP REPAIR      Family History  Problem Relation Age of Onset   Hypertension Mother    Heart disease Mother        arrythmia- 'heart skipping'   Thyroid disease Mother    Healthy Father    Stroke Maternal Grandmother    Stroke Maternal Grandfather    Stroke Paternal Grandmother    Diabetes Paternal Grandmother    Diabetes Paternal Grandfather     Social History   Tobacco Use   Smoking status: Never   Smokeless tobacco: Never  Vaping Use   Vaping Use: Never used  Substance Use Topics   Alcohol use: No   Drug use: No    Allergies:  Allergies  Allergen Reactions   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 (Patient not taking: Reported on 06/22/2021) 56 g 0    Blood Pressure Monitoring  (BLOOD PRESSURE KIT) DEVI 1 Device by Does not apply route as needed. (Patient not taking: Reported on 06/22/2021) 1 each 0    ibuprofen (ADVIL) 600 MG tablet Take 1 tablet (600 mg total) by mouth every 6 (six) hours. 30 tablet 0    Prenatal Vit-Fe Fumarate-FA (PRENATAL MULTIVITAMIN) TABS tablet Take 1 tablet by mouth daily at 12 noon.      Probiotic Product (PROBIOTIC PO) Take 1 capsule by mouth daily. (Patient not taking: Reported on 06/22/2021)      Results for orders placed or performed during the hospital encounter of 06/24/21 (from the past 48 hour(s))  Urinalysis, Routine w reflex microscopic Urine, Clean Catch     Status: Abnormal   Collection Time: 06/24/21  6:49 PM  Result Value Ref Range   Color, Urine YELLOW YELLOW   APPearance HAZY (A) CLEAR   Specific Gravity, Urine 1.016 1.005 - 1.030   pH 6.0 5.0 - 8.0   Glucose, UA NEGATIVE NEGATIVE mg/dL   Hgb urine dipstick MODERATE (A) NEGATIVE   Bilirubin Urine NEGATIVE NEGATIVE   Ketones, ur NEGATIVE NEGATIVE mg/dL  Protein, ur 100 (A) NEGATIVE mg/dL   Nitrite POSITIVE (A) NEGATIVE   Leukocytes,Ua LARGE (A) NEGATIVE   RBC / HPF >50 (H) 0 - 5 RBC/hpf   WBC, UA >50 (H) 0 - 5 WBC/hpf   Bacteria, UA MANY (A) NONE SEEN   Squamous Epithelial / LPF 0-5 0 - 5   Mucus PRESENT    Non Squamous Epithelial 0-5 (A) NONE SEEN    Comment: Performed at [facility], [place] [address] [place], [place] [address]    Review of Systems  Genitourinary:  Positive for dysuria, frequency and urgency. Negative for flank pain and hematuria.  Physical Exam   Blood pressure 115/75, pulse 93, temperature 98.1 F (36.7 C), temperature source Oral, resp. rate 18, SpO2 98 %, currently breastfeeding.  Physical Exam Constitutional:      General: She is not in acute distress.    Appearance: Normal appearance. She is not ill-appearing, toxic-appearing or diaphoretic.  Neurological:     Mental Status: She is alert and oriented to person, place, and time.   Psychiatric:        Behavior: Behavior normal.   MAU Course  Procedures  MDM  Urine culture pending   Assessment and Plan   1. Infection of bladder following delivery   2. Other urinary tract infection following delivery      P:  Discharge home in stable condition Rx: Bactrim  Keep PP visit. Return to MAU for emergencies only   Florentine Diekman, Artist Pais, NP 06/24/2021 8:07 PM

## 2021-06-24 NOTE — Telephone Encounter (Addendum)
Patient left VM on nurse line requesting results. Also states she is concerned she may have a bladder infection, reports pain with urination.

## 2021-06-24 NOTE — MAU Note (Signed)
Jenny [name] is a 35 y.o. here in MAU reporting: s/p SVD on 8/29. States since last night she has had dysuria and urinary frequency.  Onset of complaint: last night  Pain score: 0/10  Vitals:   06/24/21 1824  BP: 115/75  Pulse: 93  Resp: 18  Temp: 98.1 F (36.7 C)  SpO2: 98%     Lab orders placed from triage: UA

## 2021-06-25 NOTE — Progress Notes (Signed)
Testing was negative for infection

## 2021-06-25 NOTE — Telephone Encounter (Signed)
Adam Phenix, MD Physician Signed     8:35 AM   Copy   Testing was negative for infection     Called and left message with pt with results and recommendatoins per Dr Debroah Loop. Pt advised if continues to have symptoms, to return call and have nurse visit to check again.  Judeth Cornfield, RN

## 2021-06-27 LAB — CULTURE, OB URINE: Culture: >=100000 — AB

## 2021-07-02 ENCOUNTER — Encounter: Payer: Self-pay | Admitting: General Practice

## 2021-07-06 ENCOUNTER — Ambulatory Visit: Payer: Medicaid Other | Admitting: Obstetrics and Gynecology

## 2021-07-22 ENCOUNTER — Ambulatory Visit (INDEPENDENT_AMBULATORY_CARE_PROVIDER_SITE_OTHER): Payer: Medicaid Other | Admitting: Obstetrics and Gynecology

## 2021-07-22 ENCOUNTER — Other Ambulatory Visit: Payer: Self-pay

## 2021-07-22 ENCOUNTER — Encounter: Payer: Self-pay | Admitting: Obstetrics and Gynecology

## 2021-07-22 NOTE — Patient Instructions (Signed)
Health Maintenance, Female Adopting a healthy lifestyle and getting preventive care are important in promoting health and wellness. Ask your health care provider about: The right schedule for you to have regular tests and exams. Things you can do on your own to prevent diseases and keep yourself healthy. What should I know about diet, weight, and exercise? Eat a healthy diet  Eat a diet that includes plenty of vegetables, fruits, low-fat dairy products, and lean protein. Do not eat a lot of foods that are high in solid fats, added sugars, or sodium. Maintain a healthy weight Body mass index (BMI) is used to identify weight problems. It estimates body fat based on height and weight. Your health care provider can help determine your BMI and help you achieve or maintain a healthy weight. Get regular exercise Get regular exercise. This is one of the most important things you can do for your health. Most adults should: Exercise for at least 150 minutes each week. The exercise should increase your heart rate and make you sweat (moderate-intensity exercise). Do strengthening exercises at least twice a week. This is in addition to the moderate-intensity exercise. Spend less time sitting. Even light physical activity can be beneficial. Watch cholesterol and blood lipids Have your blood tested for lipids and cholesterol at 35 years of age, then have this test every 5 years. Have your cholesterol levels checked more often if: Your lipid or cholesterol levels are high. You are older than 35 years of age. You are at high risk for heart disease. What should I know about cancer screening? Depending on your health history and family history, you may need to have cancer screening at various ages. This may include screening for: Breast cancer. Cervical cancer. Colorectal cancer. Skin cancer. Lung cancer. What should I know about heart disease, diabetes, and high blood pressure? Blood pressure and heart  disease High blood pressure causes heart disease and increases the risk of stroke. This is more likely to develop in people who have high blood pressure readings, are of African descent, or are overweight. Have your blood pressure checked: Every 3-5 years if you are 18-39 years of age. Every year if you are 40 years old or older. Diabetes Have regular diabetes screenings. This checks your fasting blood sugar level. Have the screening done: Once every three years after age 40 if you are at a normal weight and have a low risk for diabetes. More often and at a younger age if you are overweight or have a high risk for diabetes. What should I know about preventing infection? Hepatitis B If you have a higher risk for hepatitis B, you should be screened for this virus. Talk with your health care provider to find out if you are at risk for hepatitis B infection. Hepatitis C Testing is recommended for: Everyone born from 1945 through 1965. Anyone with known risk factors for hepatitis C. Sexually transmitted infections (STIs) Get screened for STIs, including gonorrhea and chlamydia, if: You are sexually active and are younger than 35 years of age. You are older than 35 years of age and your health care provider tells you that you are at risk for this type of infection. Your sexual activity has changed since you were last screened, and you are at increased risk for chlamydia or gonorrhea. Ask your health care provider if you are at risk. Ask your health care provider about whether you are at high risk for HIV. Your health care provider may recommend a prescription medicine   to help prevent HIV infection. If you choose to take medicine to prevent HIV, you should first get tested for HIV. You should then be tested every 3 months for as long as you are taking the medicine. Pregnancy If you are about to stop having your period (premenopausal) and you may become pregnant, seek counseling before you get  pregnant. Take 400 to 800 micrograms (mcg) of folic acid every day if you become pregnant. Ask for birth control (contraception) if you want to prevent pregnancy. Osteoporosis and menopause Osteoporosis is a disease in which the bones lose minerals and strength with aging. This can result in bone fractures. If you are 65 years old or older, or if you are at risk for osteoporosis and fractures, ask your health care provider if you should: Be screened for bone loss. Take a calcium or vitamin D supplement to lower your risk of fractures. Be given hormone replacement therapy (HRT) to treat symptoms of menopause. Follow these instructions at home: Lifestyle Do not use any products that contain nicotine or tobacco, such as cigarettes, e-cigarettes, and chewing tobacco. If you need help quitting, ask your health care provider. Do not use street drugs. Do not share needles. Ask your health care provider for help if you need support or information about quitting drugs. Alcohol use Do not drink alcohol if: Your health care provider tells you not to drink. You are pregnant, may be pregnant, or are planning to become pregnant. If you drink alcohol: Limit how much you use to 0-1 drink a day. Limit intake if you are breastfeeding. Be aware of how much alcohol is in your drink. In the [place] one drink equals one 12 oz bottle of beer (355 mL), one 5 oz glass of wine (148 mL), or one 1 oz glass of hard liquor (44 mL). General instructions Schedule regular health, dental, and eye exams. Stay current with your vaccines. Tell your health care provider if: You often feel depressed. You have ever been abused or do not feel safe at home. Summary Adopting a healthy lifestyle and getting preventive care are important in promoting health and wellness. Follow your health care provider's instructions about healthy diet, exercising, and getting tested or screened for diseases. Follow your health care provider's  instructions on monitoring your cholesterol and blood pressure. This information is not intended to replace advice given to you by your health care provider. Make sure you discuss any questions you have with your health care provider. Document Revised: 12/05/2020 Document Reviewed: 09/20/2018 Elsevier Patient Education  2022 Elsevier Inc.  

## 2021-07-22 NOTE — Progress Notes (Signed)
    Post Partum Visit Note  Jenny [name] is a 35 y.o. [id number] female who presents for a postpartum visit. She is 6 weeks postpartum following a normal spontaneous vaginal delivery.  I have fully reviewed the prenatal and intrapartum course. The delivery was at 42 gestational weeks.  Anesthesia: epidural. Postpartum course has been unremarkable. Baby is doing well. Baby is feeding by breast. Bleeding no bleeding. Bowel function is normal. Bladder function is normal. Patient is not sexually active. Contraception method is none. Postpartum depression screening: negative.   The pregnancy intention screening data noted above was reviewed. Potential methods of contraception were discussed. The patient elected to proceed with No data recorded.    Health Maintenance Due  Topic Date Due   COVID-19 Vaccine (1) Never done   INFLUENZA VACCINE  Never done    Medical record  Review of Systems Pertinent items noted in HPI and remainder of comprehensive ROS otherwise negative.  Objective:  There were no vitals taken for this visit.   General:  alert   Breasts:  not indicated  Lungs: clear to auscultation bilaterally  Heart:  regular rate and rhythm, S1, S2 normal, no murmur, click, rub or gallop  Abdomen: Soft + BS  Wound NA  GU exam:  not indicated       Assessment:    There are no diagnoses linked to this encounter.  NL postpartum exam.   Plan:   Essential components of care per ACOG recommendations:  1.  Mood and well being: Patient with negative depression screening today. Reviewed local resources for support.  - Patient tobacco use? No.   - hx of drug use? No.    2. Infant care and feeding:  -Patient currently breastmilk feeding? Yes. Discussed returning to work and pumping. Reviewed importance of draining breast regularly to support lactation.  -Social determinants of health (SDOH) reviewed in EPIC. No concerns  3. Sexuality, contraception and birth spacing - Patient  does not want a pregnancy in the next year.  Desired family size is uncertain  - Reviewed forms of contraception in tiered fashion. Patient desired no method today.   - Discussed birth spacing of 18 months  4. Sleep and fatigue -Encouraged family/partner/community support of 4 hrs of uninterrupted sleep to help with mood and fatigue  5. Physical Recovery  - Discussed patients delivery and complications. She describes her labor as good. - Patient had a Vaginal, no problems at delivery.  Perineal healing reviewed. Patient expressed understanding - Patient has urinary incontinence? No. - Patient is safe to resume physical and sexual activity  6.  Health Maintenance - HM due items addressed Yes - Last pap smear  Diagnosis  Date Value Ref Range Status  02/12/2021   Final   - Negative for intraepithelial lesion or malignancy (NILM)   Pap smear not done at today's visit.  -Breast Cancer screening indicated? No.   7. Chronic Disease/Pregnancy Condition follow up: None   Nettie Elm. MD [facility] [employer]

## 2022-01-23 IMAGING — US US MFM OB DETAIL+14 WK
1 series · 13 of 28 positions shown · non-contrast
Comparison: none

[Series 1: us mfm ob detail+14 wk · 13 of 106 slices shown]
[im 4/106]
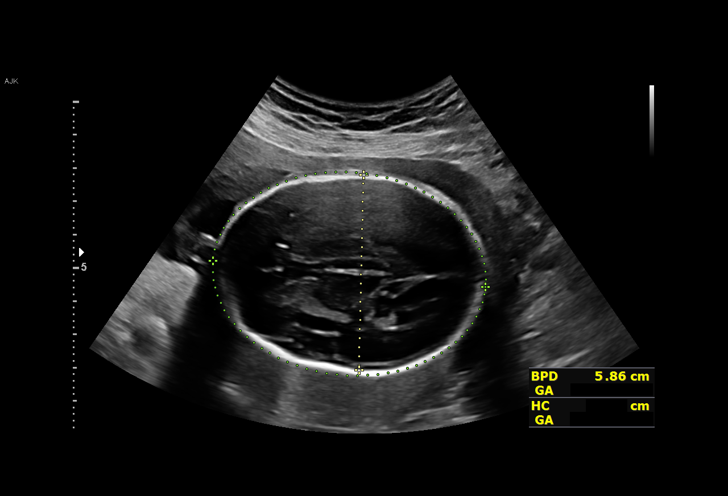
[im 12/106]
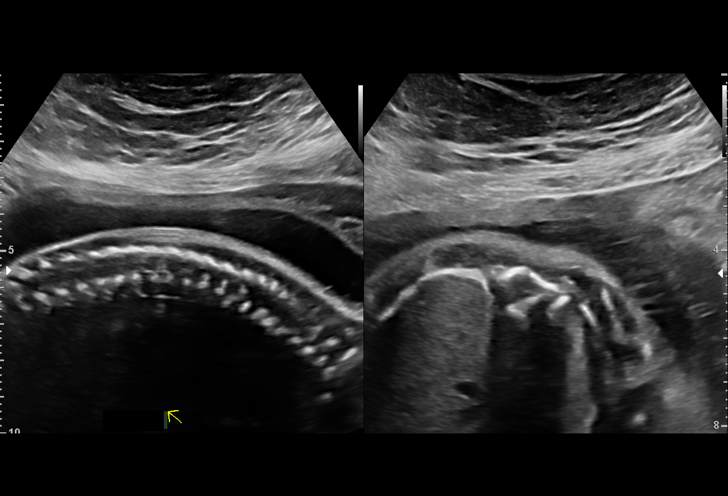
[im 20/106]
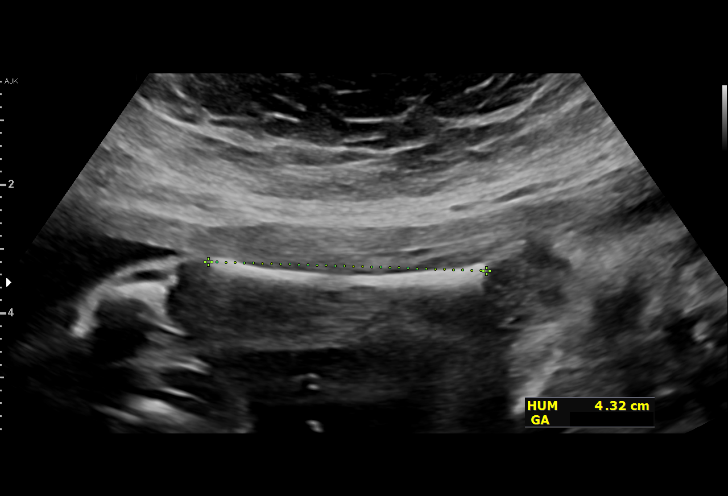
[im 28/106]
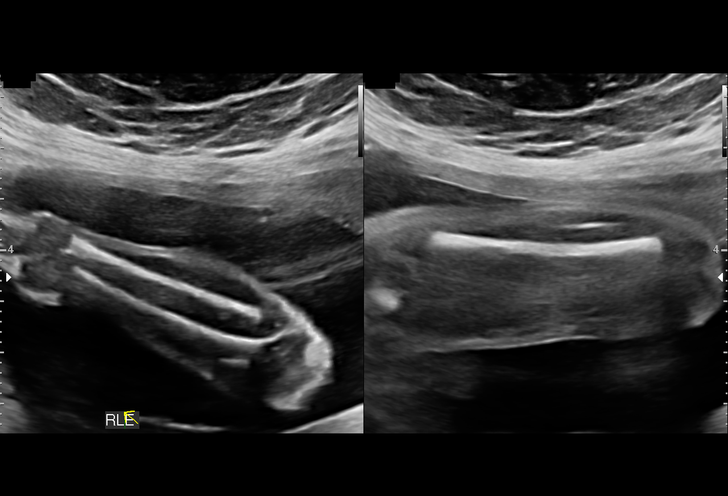
[im 36/106]
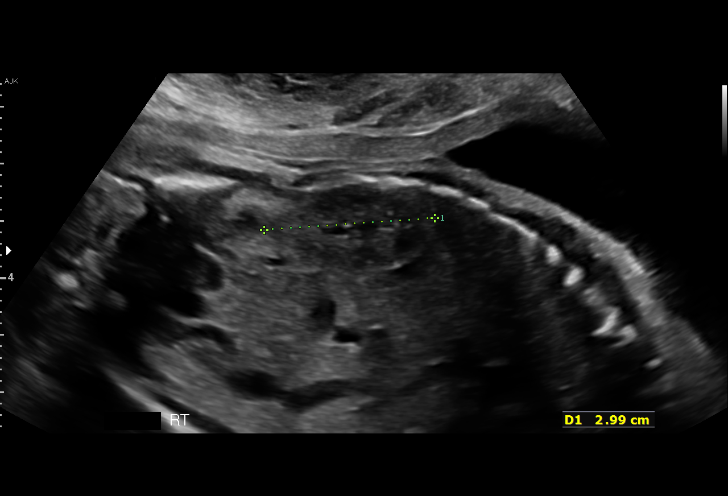
[im 43/106]
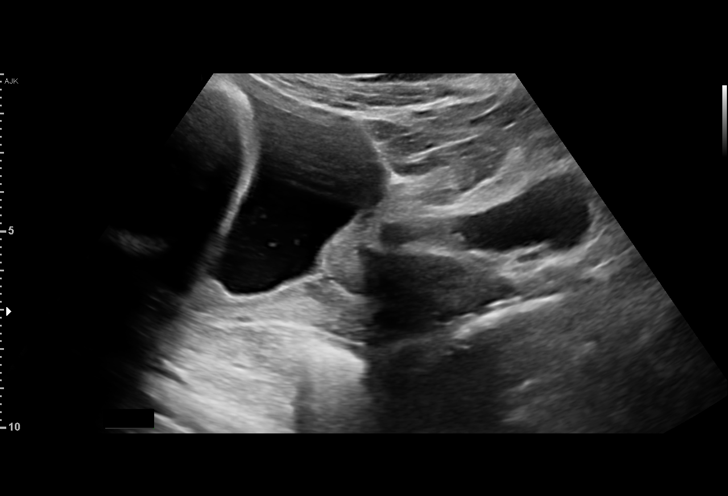
[im 55/106]
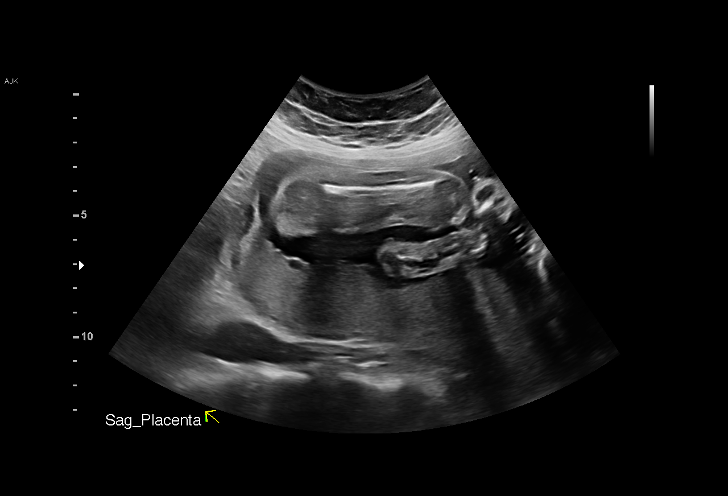
[im 63/106]
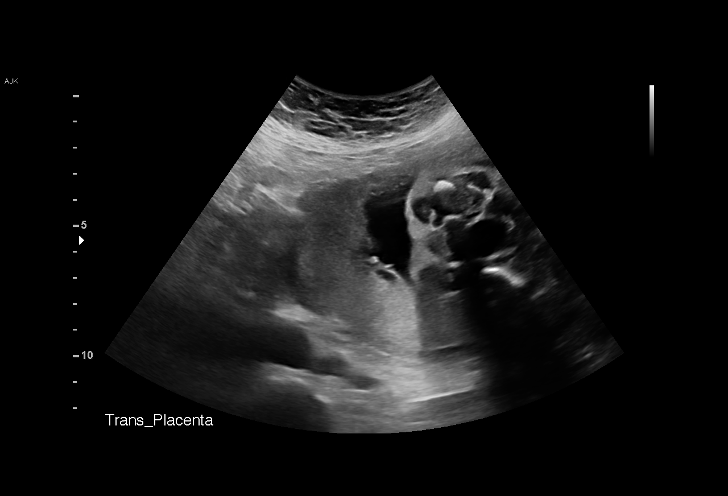
[im 71/106]
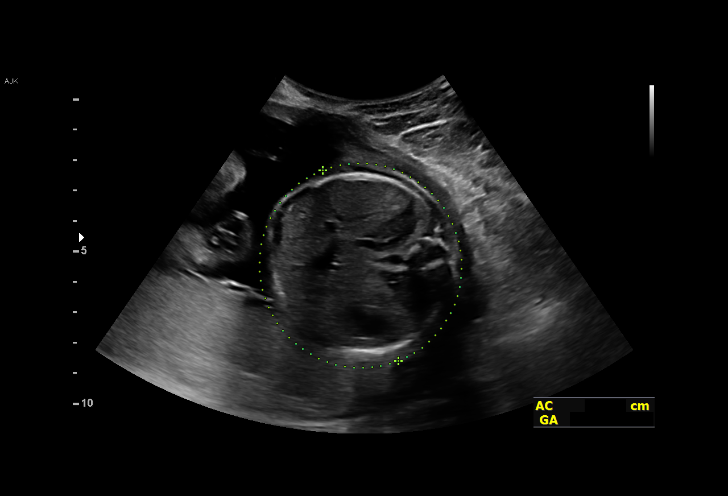
[im 78/106]
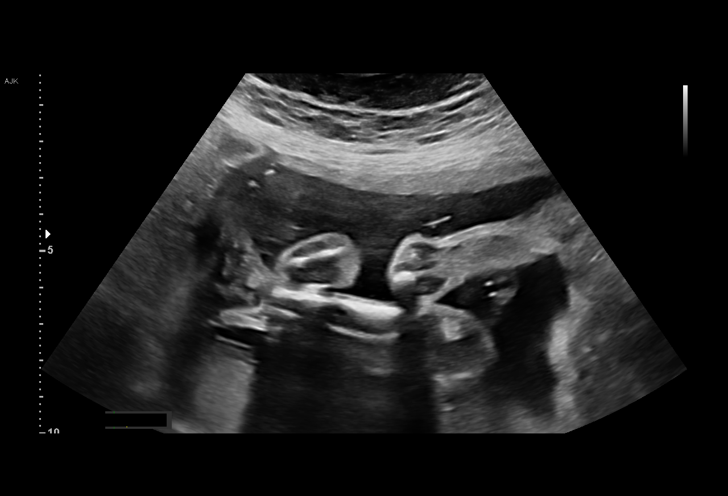
[im 86/106]
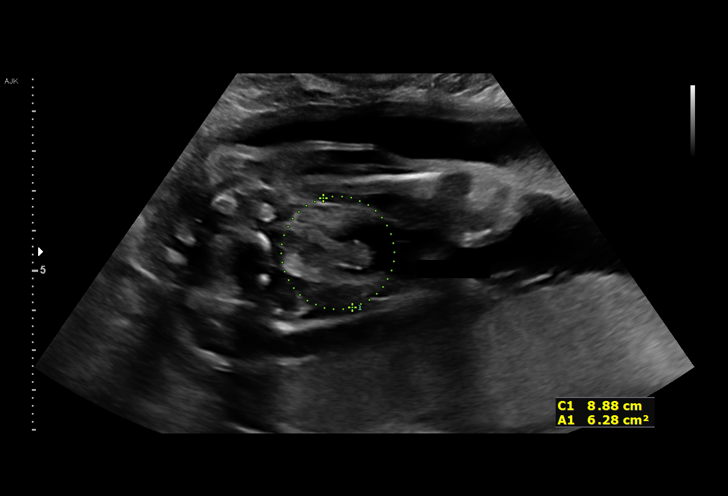
[im 94/106]
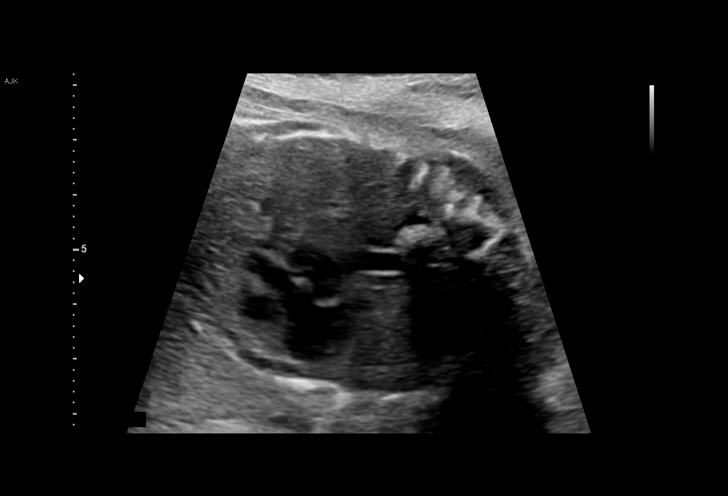
[im 102/106]
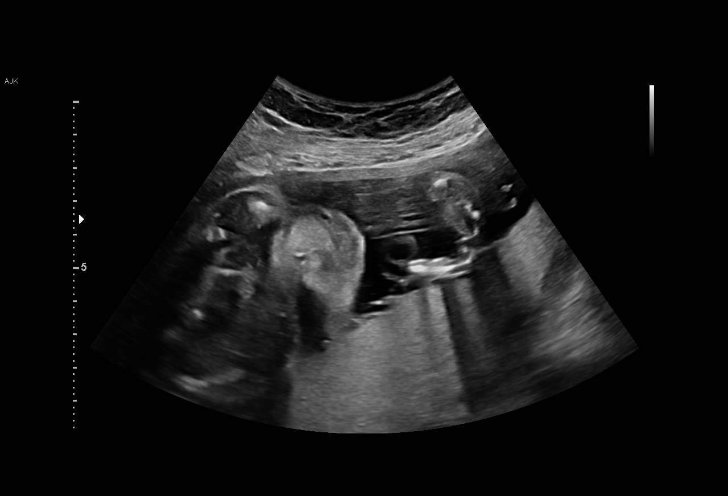

[13 of 28 positions shown; findings below may reference images not displayed]

[REDACTED]care

Indications

 25 weeks gestation of pregnancy
Fetal Evaluation

 Num Of Fetuses:         1
 Cardiac Activity:       Observed
 Presentation:           Cephalic
 Placenta:               Post, low-lying, 1.8 cm from int os
 P. Cord Insertion:      Visualized

 Amniotic Fluid
 AFI FV:      Within normal limits
Biometry

 BPD:        59  mm     G. Age:  24w 1d          7  %    CI:        68.99   %    70 - 86
                                                         FL/HC:      20.2   %    18.7 -
 HC:      226.9  mm     G. Age:  24w 5d         10  %    HC/AC:      1.07        1.04 -
 AC:      212.9  mm     G. Age:  25w 6d         53  %    FL/BPD:     77.6   %    71 - 87
 FL:       45.8  mm     G. Age:  25w 1d         29  %    FL/AC:      21.5   %    20 - 24
 HUM:      42.6  mm     G. Age:  25w 4d         47  %
 CER:      28.5  mm     G. Age:  25w 1d         44  %

 LV:        3.8  mm
 CM:        6.2  mm

 Est. FW:     803  gm    1 lb 12 oz      37  %
OB History
 Gravidity:    5         Term:   3         SAB:   1
Gestational Age

 LMP:           25w 3d        Date:  08/15/20                 EDD:   05/22/21
 U/S Today:     25w 0d                                        EDD:   05/25/21
 Best:          25w 3d     Det. By:  LMP  (08/15/20)          EDD:   05/22/21
Anatomy

 Cranium:               Appears normal         LVOT:                   Not well visualized
 Cavum:                 Appears normal         Aortic Arch:            Appears normal
 Ventricles:            Appears normal         Ductal Arch:            Not well visualized
 Choroid Plexus:        Appears normal         Diaphragm:              Appears normal
 Cerebellum:            Appears normal         Stomach:                Appears normal, left
                                                                       sided
 Posterior Fossa:       Appears normal         Abdomen:                Appears normal
 Nuchal Fold:           Not applicable (>20    Abdominal Wall:         Appears nml (cord
                        wks GA)                                        insert, abd wall)
 Face:                  Not well visualized    Cord Vessels:           Appears normal (3
                                                                       vessel cord)
 Lips:                  Appears normal         Kidneys:                Appear normal
 Palate:                Not well visualized    Bladder:                Appears normal
 Thoracic:              Appears normal         Spine:                  Appears normal
 Heart:                 Not well visualized    Upper Extremities:      Appears normal
 RVOT:                  Not well visualized    Lower Extremities:      Appears normal

 Other:  Fetus appears to be a male. Heels and 5th digit visualized. Open
         hands visualized. Technically difficult due to fetal position.
Cervix Uterus Adnexa

 Cervix
 Length:            4.1  cm.
 Normal appearance by transabdominal scan.

 Uterus
 No abnormality visualized.

 Right Ovary
 Within normal limits.

 Left Ovary
 Within normal limits.

 Adnexa
 No abnormality visualized.
Impression

 Advanced maternal age.  Patient had opted not to screen for
 fetal aneuploidies.  She is here for fetal anatomy scan.
 Obstetric history significant for 3 term vaginal deliveries
 (including twin delivery).
 We performed a fetal anatomy scan. No markers of
 aneuploidies or fetal structural defects are seen. Fetal
 biometry is consistent with her previously-established dates.
 Amniotic fluid is normal and good fetal activity is seen.
 Patient understands the limitations of ultrasound in detecting
 fetal anomalies.

 Placenta and low lying. Patient does not have symptoms of
 vaginal bleeding. We reassured that low-lying placenta
 resolves with advancing gestation. In the absence of vaginal
 bleeding, abstinence from sexual intercourse is not
 necessary.
Recommendations

 -An appointment was made for her to return in 4 weeks for
 completion of fetal anatomy.
                 Ramiz, Bedih

## 2023-09-19 ENCOUNTER — Ambulatory Visit (HOSPITAL_COMMUNITY)
Admission: EM | Admit: 2023-09-19 | Discharge: 2023-09-19 | Disposition: A | Discharge status: Home / Self Care | Payer: Medicaid Other | Attending: Addiction Medicine | Admitting: Addiction Medicine

## 2023-09-19 DIAGNOSIS — Z91410 Personal history of adult physical and sexual abuse: Secondary | ICD-10-CM | POA: Insufficient documentation

## 2023-09-19 DIAGNOSIS — F430 Acute stress reaction: Secondary | ICD-10-CM
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
## 2023-09-19 NOTE — ED Notes (Signed)
Patient discharged in no acute distress. AVS printed and reviewed. Patient's belongings returned from pink locker. Patient escorted to the front lobby for discharge home. Pt plans to call her parents for transportation. Patient was advised to let front staff know if she needs a transportation voucher. Safety maintained.
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
## 2023-09-29 ENCOUNTER — Ambulatory Visit: Payer: Medicaid Other | Admitting: Family

## 2023-09-29 ENCOUNTER — Encounter: Payer: Self-pay | Admitting: Family

## 2023-09-29 VITALS — BP 126/82 | HR 72 | Ht 65.0 in | Wt 204.8 lb

## 2023-09-29 DIAGNOSIS — F430 Acute stress reaction: Secondary | ICD-10-CM

## 2023-09-29 NOTE — Progress Notes (Signed)
Jenny [name] is a 37 y.o. female with the following history as recorded in [employer]:  Patient Active Problem List   Diagnosis Date Noted   Acute stress reaction 09/19/2023   Postpartum care following vaginal delivery 07/22/2021   Hx of preeclampsia, prior pregnancy, currently pregnant, third trimester 02/12/2021   Hx of postpartum hemorrhage, currently pregnant, third trimester 02/12/2021   Family history of hemochromatosis 02/12/2021    Current Outpatient Medications  Medication Sig Dispense Refill   Multiple Vitamin (MULTIVITAMIN) capsule Take 1 capsule by mouth daily.     Prenatal Vit-Fe Fumarate-FA (PRENATAL MULTIVITAMIN) TABS tablet Take 1 tablet by mouth daily at 12 noon. (Patient not taking: Reported on 09/29/2023)     No current facility-administered medications for this visit.    Allergies: Horseradish [armoracia rusticana ext (horseradish)]  Past Medical History:  Diagnosis Date   Anemia    Dyspnea    Eczema    Mild preeclampsia, third trimester 08/01/2018   Postpartum hemorrhage - 3400 cc 08/06/2018   Pregnancy induced hypertension    Urinary tract infection    Vaginal Pap smear, abnormal     Past Surgical History:  Procedure Laterality Date   LIP REPAIR      Family History  Problem Relation Age of Onset   Hypertension Mother    Heart disease Mother        arrythmia- 'heart skipping'   Thyroid disease Mother    Healthy Father    Stroke Maternal Grandmother    Stroke Maternal Grandfather    Stroke Paternal Grandmother    Diabetes Paternal Grandmother    Diabetes Paternal Grandfather     Social History   Tobacco Use   Smoking status: Never   Smokeless tobacco: Never  Substance Use Topics   Alcohol use: No    Subjective:   Patient is seen today as a new patient- she notes she was told by her social worker that she needed to establish here so "we could set up things for her." In reviewing notes, she was [facility] by the [facility] to [facility]  after husband requested IVC order on December 9; provider at [facility] did not feel that she needed to be committed but rather that patient should establish with counselor- does have that contact information but has not reached out to get scheduled;   Patient notes that CPS has now become involved and her children are now living with her husband at his parents' home; she provides paperwork and notes the social worker she has been referring to is from CPS;     Objective:  Vitals:   09/29/23 0845  BP: 126/82  Pulse: 72  SpO2: 98%  Weight: 204 lb 12.8 oz (92.9 kg)  Height: 5\' 5"  (1.651 m)    General: Well developed, well nourished, in no acute distress  Skin : Warm and dry.  Head: Normocephalic and atraumatic  Lungs: Respirations unlabored;  Neurologic: Alert and oriented; speech intact; face symmetrical; moves all extremities well; CNII-XII intact without focal deficit   Limited physical exam as majority of visit was spent counseling  Assessment:  1. Stress reaction     Plan:  Patient's children have been removed from her home per CPS order; she was recommended to start counseling after evaluation at [facility]; she is agreeable to having referrals to both counseling and psychiatry updated; Patient is adamant she is not suicidal and has no plans to harm anyone else. She wants to get her children  back and is agreeable to taking the necessary steps to make this happen.   No follow-ups on file.  Orders Placed This Encounter  Procedures   Ambulatory referral to Behavioral Health    Referral Priority:   Routine    Referral Type:   Psychiatric    Referral Reason:   Specialty Services Required    Requested Specialty:   Behavioral Health    Number of Visits Requested:   1   Ambulatory referral to Psychiatry    Referral Priority:   Routine    Referral Type:   Psychiatric    Referral Reason:   Specialty Services Required    Requested Specialty:    Psychiatry    Number of Visits Requested:   1    Requested Prescriptions    No prescriptions requested or ordered in this encounter

## 2023-10-15 ENCOUNTER — Encounter (HOSPITAL_COMMUNITY): Payer: Self-pay

## 2023-10-15 ENCOUNTER — Ambulatory Visit (HOSPITAL_COMMUNITY): Payer: Medicaid Other | Admitting: Psychiatry

## 2023-10-15 ENCOUNTER — Telehealth (HOSPITAL_COMMUNITY): Payer: Self-pay | Admitting: Psychiatry
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
## 2023-10-20 ENCOUNTER — Ambulatory Visit (INDEPENDENT_AMBULATORY_CARE_PROVIDER_SITE_OTHER): Payer: Medicaid Other | Admitting: Licensed Clinical Social Worker

## 2023-10-20 DIAGNOSIS — F4323 Adjustment disorder with mixed anxiety and depressed mood: Secondary | ICD-10-CM

## 2023-10-20 DIAGNOSIS — Z87898 Personal history of other specified conditions: Secondary | ICD-10-CM
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
## 2023-10-31 ENCOUNTER — Encounter (HOSPITAL_COMMUNITY): Payer: Self-pay

## 2023-10-31 ENCOUNTER — Ambulatory Visit (INDEPENDENT_AMBULATORY_CARE_PROVIDER_SITE_OTHER): Payer: Medicaid Other | Admitting: Licensed Clinical Social Worker

## 2023-10-31 DIAGNOSIS — Z87898 Personal history of other specified conditions: Secondary | ICD-10-CM

## 2023-10-31 DIAGNOSIS — F4323 Adjustment disorder with mixed anxiety and depressed mood: Secondary | ICD-10-CM | POA: Diagnosis not present
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
## 2023-11-14 ENCOUNTER — Ambulatory Visit (INDEPENDENT_AMBULATORY_CARE_PROVIDER_SITE_OTHER): Payer: Medicaid Other | Admitting: Licensed Clinical Social Worker

## 2023-11-14 DIAGNOSIS — F4323 Adjustment disorder with mixed anxiety and depressed mood: Secondary | ICD-10-CM

## 2023-11-14 DIAGNOSIS — Z87898 Personal history of other specified conditions: Secondary | ICD-10-CM
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
## 2023-11-17 ENCOUNTER — Telehealth (HOSPITAL_COMMUNITY): Payer: Self-pay | Admitting: Licensed Clinical Social Worker
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
## 2023-11-23 ENCOUNTER — Ambulatory Visit (INDEPENDENT_AMBULATORY_CARE_PROVIDER_SITE_OTHER): Payer: Medicaid Other | Admitting: Licensed Clinical Social Worker

## 2023-11-23 DIAGNOSIS — Z87898 Personal history of other specified conditions: Secondary | ICD-10-CM | POA: Diagnosis not present

## 2023-11-23 DIAGNOSIS — F4323 Adjustment disorder with mixed anxiety and depressed mood: Secondary | ICD-10-CM | POA: Diagnosis not present
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
## 2023-12-07 ENCOUNTER — Ambulatory Visit (INDEPENDENT_AMBULATORY_CARE_PROVIDER_SITE_OTHER): Payer: Medicaid Other | Admitting: Licensed Clinical Social Worker

## 2023-12-07 DIAGNOSIS — Z87898 Personal history of other specified conditions: Secondary | ICD-10-CM | POA: Diagnosis not present

## 2023-12-07 DIAGNOSIS — F4323 Adjustment disorder with mixed anxiety and depressed mood: Secondary | ICD-10-CM
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
## 2023-12-21 ENCOUNTER — Ambulatory Visit (INDEPENDENT_AMBULATORY_CARE_PROVIDER_SITE_OTHER): Payer: Medicaid Other | Admitting: Licensed Clinical Social Worker

## 2023-12-21 DIAGNOSIS — Z87898 Personal history of other specified conditions: Secondary | ICD-10-CM | POA: Diagnosis not present

## 2023-12-21 DIAGNOSIS — F4323 Adjustment disorder with mixed anxiety and depressed mood: Secondary | ICD-10-CM
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
## 2024-01-04 ENCOUNTER — Ambulatory Visit (INDEPENDENT_AMBULATORY_CARE_PROVIDER_SITE_OTHER): Payer: Medicaid Other | Admitting: Licensed Clinical Social Worker

## 2024-01-04 DIAGNOSIS — F4323 Adjustment disorder with mixed anxiety and depressed mood: Secondary | ICD-10-CM

## 2024-01-04 DIAGNOSIS — Z87898 Personal history of other specified conditions: Secondary | ICD-10-CM
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
## 2024-01-24 ENCOUNTER — Ambulatory Visit (HOSPITAL_COMMUNITY): Payer: Medicaid Other | Admitting: Licensed Clinical Social Worker

## 2024-01-25 ENCOUNTER — Ambulatory Visit (INDEPENDENT_AMBULATORY_CARE_PROVIDER_SITE_OTHER): Admitting: Licensed Clinical Social Worker

## 2024-01-25 DIAGNOSIS — F4323 Adjustment disorder with mixed anxiety and depressed mood: Secondary | ICD-10-CM | POA: Diagnosis not present

## 2024-01-25 DIAGNOSIS — Z87898 Personal history of other specified conditions: Secondary | ICD-10-CM | POA: Diagnosis not present
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
## 2024-02-09 ENCOUNTER — Ambulatory Visit (INDEPENDENT_AMBULATORY_CARE_PROVIDER_SITE_OTHER): Admitting: Licensed Clinical Social Worker

## 2024-02-09 DIAGNOSIS — F4323 Adjustment disorder with mixed anxiety and depressed mood: Secondary | ICD-10-CM | POA: Diagnosis not present

## 2024-02-09 DIAGNOSIS — Z87898 Personal history of other specified conditions: Secondary | ICD-10-CM
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
## 2024-02-21 ENCOUNTER — Ambulatory Visit (HOSPITAL_COMMUNITY): Admitting: Licensed Clinical Social Worker

## 2024-03-07 ENCOUNTER — Ambulatory Visit (HOSPITAL_COMMUNITY): Admitting: Licensed Clinical Social Worker

## 2024-03-22 ENCOUNTER — Ambulatory Visit (INDEPENDENT_AMBULATORY_CARE_PROVIDER_SITE_OTHER): Admitting: Licensed Clinical Social Worker

## 2024-03-22 DIAGNOSIS — Z87898 Personal history of other specified conditions: Secondary | ICD-10-CM | POA: Diagnosis not present

## 2024-03-22 DIAGNOSIS — F4323 Adjustment disorder with mixed anxiety and depressed mood: Secondary | ICD-10-CM | POA: Diagnosis not present
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
## 2024-04-02 ENCOUNTER — Ambulatory Visit (INDEPENDENT_AMBULATORY_CARE_PROVIDER_SITE_OTHER): Admitting: Licensed Clinical Social Worker

## 2024-04-02 DIAGNOSIS — Z87898 Personal history of other specified conditions: Secondary | ICD-10-CM | POA: Diagnosis not present

## 2024-04-02 DIAGNOSIS — F4323 Adjustment disorder with mixed anxiety and depressed mood: Secondary | ICD-10-CM | POA: Diagnosis not present
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
## 2024-04-16 ENCOUNTER — Ambulatory Visit (HOSPITAL_COMMUNITY): Admitting: Licensed Clinical Social Worker

## 2024-05-07 ENCOUNTER — Ambulatory Visit (INDEPENDENT_AMBULATORY_CARE_PROVIDER_SITE_OTHER): Admitting: Licensed Clinical Social Worker

## 2024-05-07 DIAGNOSIS — Z87898 Personal history of other specified conditions: Secondary | ICD-10-CM

## 2024-05-07 DIAGNOSIS — F4323 Adjustment disorder with mixed anxiety and depressed mood: Secondary | ICD-10-CM | POA: Diagnosis not present
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
## 2024-05-22 ENCOUNTER — Ambulatory Visit (INDEPENDENT_AMBULATORY_CARE_PROVIDER_SITE_OTHER): Admitting: Licensed Clinical Social Worker

## 2024-05-22 DIAGNOSIS — F4323 Adjustment disorder with mixed anxiety and depressed mood: Secondary | ICD-10-CM

## 2024-05-22 DIAGNOSIS — Z87898 Personal history of other specified conditions: Secondary | ICD-10-CM | POA: Diagnosis not present
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
## 2024-06-04 ENCOUNTER — Ambulatory Visit (HOSPITAL_COMMUNITY): Admitting: Licensed Clinical Social Worker

## 2024-06-06 ENCOUNTER — Ambulatory Visit (INDEPENDENT_AMBULATORY_CARE_PROVIDER_SITE_OTHER): Admitting: Licensed Clinical Social Worker

## 2024-06-06 DIAGNOSIS — Z87898 Personal history of other specified conditions: Secondary | ICD-10-CM | POA: Diagnosis not present

## 2024-06-06 DIAGNOSIS — F4323 Adjustment disorder with mixed anxiety and depressed mood: Secondary | ICD-10-CM
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
## 2024-06-19 ENCOUNTER — Ambulatory Visit (HOSPITAL_COMMUNITY): Admitting: Licensed Clinical Social Worker

## 2024-06-19 DIAGNOSIS — F4323 Adjustment disorder with mixed anxiety and depressed mood: Secondary | ICD-10-CM

## 2024-06-19 DIAGNOSIS — Z87898 Personal history of other specified conditions: Secondary | ICD-10-CM
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
## 2024-07-03 ENCOUNTER — Ambulatory Visit (HOSPITAL_COMMUNITY): Admitting: Licensed Clinical Social Worker

## 2024-07-03 DIAGNOSIS — Z87898 Personal history of other specified conditions: Secondary | ICD-10-CM | POA: Diagnosis not present

## 2024-07-03 DIAGNOSIS — F4323 Adjustment disorder with mixed anxiety and depressed mood: Secondary | ICD-10-CM | POA: Diagnosis not present
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
## 2024-07-17 ENCOUNTER — Ambulatory Visit (INDEPENDENT_AMBULATORY_CARE_PROVIDER_SITE_OTHER): Admitting: Licensed Clinical Social Worker

## 2024-07-17 DIAGNOSIS — F4323 Adjustment disorder with mixed anxiety and depressed mood: Secondary | ICD-10-CM | POA: Diagnosis not present

## 2024-07-17 DIAGNOSIS — Z87898 Personal history of other specified conditions: Secondary | ICD-10-CM | POA: Diagnosis not present
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
## 2024-07-30 ENCOUNTER — Ambulatory Visit (INDEPENDENT_AMBULATORY_CARE_PROVIDER_SITE_OTHER): Admitting: Licensed Clinical Social Worker

## 2024-07-30 DIAGNOSIS — F4323 Adjustment disorder with mixed anxiety and depressed mood: Secondary | ICD-10-CM

## 2024-07-30 DIAGNOSIS — Z87898 Personal history of other specified conditions: Secondary | ICD-10-CM
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
## 2024-08-14 ENCOUNTER — Ambulatory Visit (HOSPITAL_COMMUNITY): Admitting: Licensed Clinical Social Worker

## 2024-08-16 ENCOUNTER — Ambulatory Visit (HOSPITAL_COMMUNITY): Admitting: Licensed Clinical Social Worker

## 2024-08-16 DIAGNOSIS — Z87898 Personal history of other specified conditions: Secondary | ICD-10-CM

## 2024-08-16 DIAGNOSIS — F4323 Adjustment disorder with mixed anxiety and depressed mood: Secondary | ICD-10-CM | POA: Diagnosis not present
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
## 2024-08-28 ENCOUNTER — Ambulatory Visit (INDEPENDENT_AMBULATORY_CARE_PROVIDER_SITE_OTHER): Admitting: Licensed Clinical Social Worker

## 2024-08-28 DIAGNOSIS — Z87898 Personal history of other specified conditions: Secondary | ICD-10-CM

## 2024-08-28 DIAGNOSIS — F4323 Adjustment disorder with mixed anxiety and depressed mood: Secondary | ICD-10-CM | POA: Diagnosis not present
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
## 2024-09-11 ENCOUNTER — Ambulatory Visit (HOSPITAL_COMMUNITY): Admitting: Licensed Clinical Social Worker

## 2024-09-25 ENCOUNTER — Ambulatory Visit (HOSPITAL_COMMUNITY): Admitting: Licensed Clinical Social Worker

## 2024-10-08 ENCOUNTER — Ambulatory Visit (HOSPITAL_COMMUNITY): Admitting: Licensed Clinical Social Worker

## 2024-10-15 ENCOUNTER — Ambulatory Visit (HOSPITAL_COMMUNITY): Admitting: Licensed Clinical Social Worker

## 2024-10-22 ENCOUNTER — Ambulatory Visit (HOSPITAL_COMMUNITY): Admitting: Licensed Clinical Social Worker

## 2024-10-22 ENCOUNTER — Encounter: Payer: Self-pay | Admitting: Internal Medicine

## 2024-10-22 ENCOUNTER — Ambulatory Visit (INDEPENDENT_AMBULATORY_CARE_PROVIDER_SITE_OTHER): Admitting: Internal Medicine

## 2024-10-22 VITALS — BP 126/62 | HR 70 | Temp 98.4°F | Ht 65.0 in | Wt 203.0 lb

## 2024-10-22 DIAGNOSIS — F4323 Adjustment disorder with mixed anxiety and depressed mood: Secondary | ICD-10-CM

## 2024-10-22 DIAGNOSIS — D229 Melanocytic nevi, unspecified: Secondary | ICD-10-CM

## 2024-10-22 DIAGNOSIS — Z87898 Personal history of other specified conditions: Secondary | ICD-10-CM | POA: Diagnosis not present

## 2024-10-22 DIAGNOSIS — Z635 Disruption of family by separation and divorce: Secondary | ICD-10-CM | POA: Diagnosis not present
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
## 2024-10-22 NOTE — Patient Instructions (Addendum)
 Psychiatric Services:  [facility] [place]) [phone] [facility] [place]) - [phone] Dr. Milagros Evener [facility]) [phone] [facility]) - [phone] [facility], [address] [place], [place] [phone]

## 2024-10-23 ENCOUNTER — Telehealth: Payer: Self-pay

## 2024-10-23 NOTE — Telephone Encounter (Signed)
 Pt had office visit 10/22/24

## 2024-10-30 ENCOUNTER — Ambulatory Visit (HOSPITAL_COMMUNITY): Admitting: Licensed Clinical Social Worker

## 2024-10-30 DIAGNOSIS — F4323 Adjustment disorder with mixed anxiety and depressed mood: Secondary | ICD-10-CM

## 2024-10-30 DIAGNOSIS — Z87898 Personal history of other specified conditions: Secondary | ICD-10-CM
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
## 2024-11-07 ENCOUNTER — Encounter (HOSPITAL_COMMUNITY): Payer: Self-pay | Admitting: Licensed Clinical Social Worker
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
## 2024-11-09 ENCOUNTER — Ambulatory Visit (INDEPENDENT_AMBULATORY_CARE_PROVIDER_SITE_OTHER): Admitting: Family

## 2024-11-09 ENCOUNTER — Ambulatory Visit: Payer: Self-pay

## 2024-11-09 VITALS — BP 124/86 | HR 85 | Temp 97.7°F | Ht 65.0 in | Wt 198.0 lb

## 2024-11-09 DIAGNOSIS — J209 Acute bronchitis, unspecified: Secondary | ICD-10-CM

## 2024-11-09 DIAGNOSIS — R051 Acute cough: Secondary | ICD-10-CM

## 2024-11-09 MED ORDER — PREDNISONE 20 MG PO TABS: 40.0000 mg | ORAL_TABLET | Freq: Every day | ORAL | 0 refills | Status: AC

## 2024-11-09 MED ORDER — ALBUTEROL SULFATE HFA 108 (90 BASE) MCG/ACT IN AERS: 2.0000 | INHALATION_SPRAY | Freq: Four times a day (QID) | RESPIRATORY_TRACT | 0 refills | Status: AC | PRN

## 2024-11-09 MED ORDER — BENZONATATE 200 MG PO CAPS: 200.0000 mg | ORAL_CAPSULE | Freq: Two times a day (BID) | ORAL | 0 refills | Status: AC | PRN

## 2024-11-09 NOTE — Progress Notes (Signed)
 "  Acute Office Visit  Subjective:     Patient ID: Jenny [name], female    DOB: 1986/03/16, 38 y.o.   MRN: [id number]  Chief Complaint  Patient presents with   Cough    Pt presents today for a cough and sob. Pt states she had bronchitis before.     HPI Patient is in today with c/o cough, congestion, and sob that tends to be worse at night. She has been taking an OTC cough medication and expectorant that has helped some. Denies fever. Reports a history of PNA and bronchitis.   Review of Systems  Constitutional:  Negative for chills and fever.  HENT: Negative.    Respiratory:  Positive for cough, sputum production and shortness of breath. Negative for wheezing.   Cardiovascular: Negative.   Musculoskeletal: Negative.   Skin: Negative.   Neurological: Negative.   Psychiatric/Behavioral: Negative.          Objective:    BP 124/86   Pulse 85   Temp 97.7 F (36.5 C)   Ht 5' 5 (1.651 m)   Wt 198 lb (89.8 kg)   LMP 10/08/2024 (Approximate)   SpO2 96%   BMI 32.95 kg/m    Physical Exam Vitals and nursing note reviewed.  Constitutional:      Appearance: Normal appearance. She is obese.  HENT:     Right Ear: Tympanic membrane, ear canal and external ear normal.     Left Ear: Tympanic membrane and external ear normal.     Mouth/Throat:     Mouth: Mucous membranes are moist.     Pharynx: Oropharynx is clear.  Cardiovascular:     Rate and Rhythm: Normal rate and regular rhythm.     Pulses: Normal pulses.     Heart sounds: Normal heart sounds.  Pulmonary:     Effort: Pulmonary effort is normal.     Breath sounds: Normal breath sounds. No wheezing.  Musculoskeletal:        General: Normal range of motion.     Cervical back: Normal range of motion and neck supple.  Skin:    General: Skin is warm and dry.  Neurological:     General: No focal deficit present.     Mental Status: She is alert and oriented to person, place, and time. Mental status is at baseline.   Psychiatric:        Mood and Affect: Mood normal.        Behavior: Behavior normal.    No results found for any visits on 11/09/24.      Assessment & Plan:   Problem List Items Addressed This Visit   None Visit Diagnoses       Acute bronchitis, unspecified organism    -  Primary     Acute cough           Meds ordered this encounter  Medications   predniSONE  (DELTASONE ) 20 MG tablet    Sig: Take 2 tablets (40 mg total) by mouth daily with breakfast.    Dispense:  10 tablet    Refill:  0   benzonatate  (TESSALON ) 200 MG capsule    Sig: Take 1 capsule (200 mg total) by mouth 2 (two) times daily as needed for cough.    Dispense:  21 capsule    Refill:  0   albuterol  (VENTOLIN  HFA) 108 (90 Base) MCG/ACT inhaler    Sig: Inhale 2 puffs into the lungs every 6 (six) hours as needed for wheezing or  shortness of breath.    Dispense:  8 g    Refill:  0   Call the office if symptoms worsen or persist. Recheck as scheduled and sooner as needed.  No follow-ups on file.  Ronell Duffus B Gillie Crisci, FNP   "

## 2024-11-09 NOTE — Telephone Encounter (Signed)
 Pt has appointment 11/09/24 @ 3:00

## 2024-11-09 NOTE — Telephone Encounter (Signed)
 FYI Only or Action Required?: FYI only for provider: appointment scheduled on this afternoon.  Patient was last seen in primary care on 10/22/2024 by Jenny Knee, FNP.  Called Nurse Triage reporting Shortness of Breath wet cough  Symptoms began several days ago.  Interventions attempted: Other: took a medication that her father had.  Symptoms are: gradually worsening.  Triage Disposition: See HCP Within 4 Hours (Or PCP Triage)  Patient/caregiver understands and will follow disposition?: Yes                   Reason for Triage: Patient coughing, gurgling in lungs, shortness of breath - had pneumonia in the past    Reason for Disposition  [1] MILD difficulty breathing (e.g., minimal/no SOB at rest, SOB with walking, pulse < 100) AND [2] NEW-onset or WORSE than normal  Answer Assessment - Initial Assessment Questions 1. RESPIRATORY STATUS: Describe your breathing? (e.g., wheezing, shortness of breath, unable to speak, severe coughing)      Shortness of breath 2. ONSET: When did this breathing problem begin?      Several days ago 3. PATTERN Does the difficult breathing come and go, or has it been constant since it started?      Constant - worse at times 4. SEVERITY: How bad is your breathing? (e.g., mild, moderate, severe)      Mild - moderate 5. RECURRENT SYMPTOM: Have you had difficulty breathing before? If Yes, ask: When was the last time? and What happened that time?      yes 7. LUNG HISTORY: Do you have any history of lung disease?  (e.g., pulmonary embolus, asthma, emphysema)     Had an inhaler 8. CAUSE: What do you think is causing the breathing problem?      Bronchitis 9. OTHER SYMPTOMS: Do you have any other symptoms? (e.g., chest pain, cough, dizziness, fever, runny nose)     Wet cough  Protocols used: Breathing Difficulty-A-AH

## 2024-11-13 ENCOUNTER — Ambulatory Visit (HOSPITAL_COMMUNITY): Admitting: Licensed Clinical Social Worker

## 2024-11-13 ENCOUNTER — Encounter (HOSPITAL_COMMUNITY): Payer: Self-pay

## 2024-11-19 ENCOUNTER — Ambulatory Visit (HOSPITAL_COMMUNITY)

## 2024-11-27 ENCOUNTER — Ambulatory Visit (HOSPITAL_COMMUNITY): Admitting: Licensed Clinical Social Worker

## 2024-12-11 ENCOUNTER — Ambulatory Visit (HOSPITAL_COMMUNITY): Admitting: Licensed Clinical Social Worker

## 2025-04-22 ENCOUNTER — Encounter: Admitting: Internal Medicine

## 2025-07-24 ENCOUNTER — Ambulatory Visit: Admitting: Physician Assistant
